# Patient Record
Sex: Female | Born: 1955
Health system: Southern US, Community
[De-identification: ages and names within clinical notes are randomized; demographics above are authoritative.]

## PROBLEM LIST (undated history)

## (undated) DIAGNOSIS — F419 Anxiety disorder, unspecified: Secondary | ICD-10-CM

## (undated) DIAGNOSIS — M79675 Pain in left toe(s): Secondary | ICD-10-CM

## (undated) DIAGNOSIS — Z0000 Encounter for general adult medical examination without abnormal findings: Secondary | ICD-10-CM

## (undated) DIAGNOSIS — E559 Vitamin D deficiency, unspecified: Secondary | ICD-10-CM

## (undated) DIAGNOSIS — L309 Dermatitis, unspecified: Secondary | ICD-10-CM

## (undated) DIAGNOSIS — M549 Dorsalgia, unspecified: Secondary | ICD-10-CM

## (undated) DIAGNOSIS — I10 Essential (primary) hypertension: Secondary | ICD-10-CM

## (undated) DIAGNOSIS — Z124 Encounter for screening for malignant neoplasm of cervix: Principal | ICD-10-CM

## (undated) DIAGNOSIS — E663 Overweight: Secondary | ICD-10-CM

## (undated) DIAGNOSIS — Z9109 Other allergy status, other than to drugs and biological substances: Secondary | ICD-10-CM

## (undated) DIAGNOSIS — E669 Obesity, unspecified: Secondary | ICD-10-CM

## (undated) DIAGNOSIS — C801 Malignant (primary) neoplasm, unspecified: Secondary | ICD-10-CM

## (undated) DIAGNOSIS — K760 Fatty (change of) liver, not elsewhere classified: Secondary | ICD-10-CM

## (undated) DIAGNOSIS — E785 Hyperlipidemia, unspecified: Secondary | ICD-10-CM

## (undated) DIAGNOSIS — Z91018 Allergy to other foods: Secondary | ICD-10-CM

## (undated) HISTORY — DX: Dermatitis, unspecified: L30.9

## (undated) HISTORY — DX: Overweight: E66.3

## (undated) HISTORY — DX: Pain in left toe(s): M79.675

## (undated) HISTORY — DX: Encounter for screening for malignant neoplasm of cervix: Z12.4

## (undated) HISTORY — PX: OTHER SURGICAL HISTORY: SHX169

## (undated) HISTORY — DX: Dorsalgia, unspecified: M54.9

## (undated) HISTORY — PX: BREAST BIOPSY: SHX20

## (undated) HISTORY — DX: Fatty (change of) liver, not elsewhere classified: K76.0

## (undated) HISTORY — DX: Anxiety disorder, unspecified: F41.9

## (undated) HISTORY — DX: Other allergy status, other than to drugs and biological substances: Z91.09

## (undated) HISTORY — DX: Allergy to other foods: Z91.018

## (undated) HISTORY — DX: Hyperlipidemia, unspecified: E78.5

## (undated) HISTORY — DX: Malignant (primary) neoplasm, unspecified: C80.1

## (undated) HISTORY — DX: Obesity, unspecified: E66.9

## (undated) HISTORY — DX: Vitamin D deficiency, unspecified: E55.9

## (undated) HISTORY — DX: Morbid (severe) obesity due to excess calories: E66.01

## (undated) HISTORY — DX: Encounter for general adult medical examination without abnormal findings: Z00.00

## (undated) HISTORY — DX: Essential (primary) hypertension: I10

---

## 2004-05-13 ENCOUNTER — Emergency Department (HOSPITAL_COMMUNITY): Admission: EM | Admit: 2004-05-13 | Discharge: 2004-05-13 | Payer: Self-pay | Admitting: Emergency Medicine

## 2004-08-26 ENCOUNTER — Encounter (INDEPENDENT_AMBULATORY_CARE_PROVIDER_SITE_OTHER): Payer: Self-pay | Admitting: *Deleted

## 2004-08-26 LAB — CONVERTED CEMR LAB

## 2004-09-06 ENCOUNTER — Ambulatory Visit: Payer: Self-pay | Admitting: Family Medicine

## 2004-12-11 ENCOUNTER — Ambulatory Visit: Payer: Self-pay | Admitting: Family Medicine

## 2004-12-22 ENCOUNTER — Ambulatory Visit: Payer: Self-pay | Admitting: Family Medicine

## 2006-04-01 ENCOUNTER — Ambulatory Visit: Payer: Self-pay | Admitting: Cardiology

## 2006-04-29 ENCOUNTER — Ambulatory Visit (HOSPITAL_COMMUNITY): Admission: RE | Admit: 2006-04-29 | Discharge: 2006-04-29 | Payer: Self-pay | Admitting: Physician Assistant

## 2006-04-29 ENCOUNTER — Encounter (INDEPENDENT_AMBULATORY_CARE_PROVIDER_SITE_OTHER): Payer: Self-pay | Admitting: Specialist

## 2006-07-25 DIAGNOSIS — I10 Essential (primary) hypertension: Secondary | ICD-10-CM

## 2006-07-25 DIAGNOSIS — F17200 Nicotine dependence, unspecified, uncomplicated: Secondary | ICD-10-CM | POA: Insufficient documentation

## 2006-07-26 ENCOUNTER — Encounter (INDEPENDENT_AMBULATORY_CARE_PROVIDER_SITE_OTHER): Payer: Self-pay | Admitting: *Deleted

## 2007-03-13 ENCOUNTER — Encounter: Payer: Self-pay | Admitting: Family Medicine

## 2007-03-14 ENCOUNTER — Encounter: Payer: Self-pay | Admitting: Family Medicine

## 2007-04-23 ENCOUNTER — Ambulatory Visit: Payer: Self-pay | Admitting: Family Medicine

## 2007-04-23 DIAGNOSIS — I10 Essential (primary) hypertension: Secondary | ICD-10-CM | POA: Insufficient documentation

## 2007-04-23 DIAGNOSIS — E782 Mixed hyperlipidemia: Secondary | ICD-10-CM

## 2007-10-01 ENCOUNTER — Encounter: Payer: Self-pay | Admitting: Family Medicine

## 2007-12-03 ENCOUNTER — Ambulatory Visit: Payer: Self-pay | Admitting: Family Medicine

## 2007-12-03 DIAGNOSIS — L309 Dermatitis, unspecified: Secondary | ICD-10-CM

## 2007-12-03 HISTORY — DX: Dermatitis, unspecified: L30.9

## 2007-12-09 ENCOUNTER — Ambulatory Visit: Payer: Self-pay | Admitting: Family Medicine

## 2007-12-20 LAB — CONVERTED CEMR LAB
ALT: 25 units/L (ref 0–35)
AST: 25 units/L (ref 0–37)
Albumin: 4.1 g/dL (ref 3.5–5.2)
Alkaline Phosphatase: 53 units/L (ref 39–117)
BUN: 13 mg/dL (ref 6–23)
Bilirubin, Direct: 0.1 mg/dL (ref 0.0–0.3)
CO2: 27 meq/L (ref 19–32)
Calcium: 9.3 mg/dL (ref 8.4–10.5)
Chloride: 102 meq/L (ref 96–112)
Cholesterol: 212 mg/dL (ref 0–200)
Creatinine, Ser: 0.9 mg/dL (ref 0.4–1.2)
Direct LDL: 128.4 mg/dL
GFR calc Af Amer: 85 mL/min
GFR calc non Af Amer: 70 mL/min
Glucose, Bld: 83 mg/dL (ref 70–99)
HDL: 59.4 mg/dL (ref >39.0)
Potassium: 3.9 meq/L (ref 3.5–5.1)
Sodium: 138 meq/L (ref 135–145)
Total Bilirubin: 0.7 mg/dL (ref 0.3–1.2)
Total CHOL/HDL Ratio: 3.6
Total Protein: 6.9 g/dL (ref 6.0–8.3)
Triglycerides: 112 mg/dL (ref 0–149)
VLDL: 22 mg/dL (ref 0–40)

## 2007-12-22 ENCOUNTER — Encounter (INDEPENDENT_AMBULATORY_CARE_PROVIDER_SITE_OTHER): Payer: Self-pay | Admitting: *Deleted

## 2008-03-05 ENCOUNTER — Telehealth (INDEPENDENT_AMBULATORY_CARE_PROVIDER_SITE_OTHER): Payer: Self-pay | Admitting: *Deleted

## 2008-03-05 ENCOUNTER — Ambulatory Visit: Payer: Self-pay | Admitting: Family Medicine

## 2008-03-05 DIAGNOSIS — R51 Headache: Secondary | ICD-10-CM | POA: Insufficient documentation

## 2008-03-05 DIAGNOSIS — R519 Headache, unspecified: Secondary | ICD-10-CM | POA: Insufficient documentation

## 2008-03-09 ENCOUNTER — Telehealth (INDEPENDENT_AMBULATORY_CARE_PROVIDER_SITE_OTHER): Payer: Self-pay | Admitting: *Deleted

## 2008-03-09 LAB — CONVERTED CEMR LAB: Sed Rate: 7 mm/hr (ref 0–22)

## 2008-03-12 ENCOUNTER — Encounter (INDEPENDENT_AMBULATORY_CARE_PROVIDER_SITE_OTHER): Payer: Self-pay | Admitting: *Deleted

## 2008-07-19 ENCOUNTER — Ambulatory Visit: Payer: Self-pay | Admitting: Family Medicine

## 2008-07-29 LAB — CONVERTED CEMR LAB
ALT: 47 units/L — ABNORMAL HIGH (ref 0–35)
AST: 29 units/L (ref 0–37)
Albumin: 4.3 g/dL (ref 3.5–5.2)
Alkaline Phosphatase: 57 units/L (ref 39–117)
BUN: 13 mg/dL (ref 6–23)
Bilirubin, Direct: 0.1 mg/dL (ref 0.0–0.3)
CO2: 30 meq/L (ref 19–32)
Calcium: 9.5 mg/dL (ref 8.4–10.5)
Chloride: 101 meq/L (ref 96–112)
Cholesterol: 231 mg/dL (ref 0–200)
Creatinine, Ser: 0.8 mg/dL (ref 0.4–1.2)
Direct LDL: 152 mg/dL
GFR calc Af Amer: 96 mL/min
GFR calc non Af Amer: 80 mL/min
Glucose, Bld: 85 mg/dL (ref 70–99)
HDL: 66.4 mg/dL (ref >39.0)
Potassium: 4.2 meq/L (ref 3.5–5.1)
Sodium: 139 meq/L (ref 135–145)
Total Bilirubin: 1 mg/dL (ref 0.3–1.2)
Total CHOL/HDL Ratio: 3.5
Total Protein: 7.5 g/dL (ref 6.0–8.3)
Triglycerides: 75 mg/dL (ref 0–149)
VLDL: 15 mg/dL (ref 0–40)

## 2008-07-30 ENCOUNTER — Encounter (INDEPENDENT_AMBULATORY_CARE_PROVIDER_SITE_OTHER): Payer: Self-pay | Admitting: *Deleted

## 2008-08-30 ENCOUNTER — Telehealth: Payer: Self-pay | Admitting: Family Medicine

## 2008-12-22 ENCOUNTER — Telehealth (INDEPENDENT_AMBULATORY_CARE_PROVIDER_SITE_OTHER): Payer: Self-pay | Admitting: *Deleted

## 2008-12-23 ENCOUNTER — Ambulatory Visit: Payer: Self-pay | Admitting: Family Medicine

## 2008-12-29 ENCOUNTER — Telehealth: Payer: Self-pay | Admitting: Family Medicine

## 2008-12-29 LAB — CONVERTED CEMR LAB
ALT: 61 units/L — ABNORMAL HIGH (ref 0–35)
AST: 40 units/L — ABNORMAL HIGH (ref 0–37)
Albumin: 4.3 g/dL (ref 3.5–5.2)
Alkaline Phosphatase: 59 units/L (ref 39–117)
Bilirubin, Direct: 0 mg/dL (ref 0.0–0.3)
Cholesterol: 220 mg/dL — ABNORMAL HIGH (ref 0–200)
Direct LDL: 144.9 mg/dL
HDL: 69.2 mg/dL (ref >39.00)
Total Bilirubin: 0.7 mg/dL (ref 0.3–1.2)
Total CHOL/HDL Ratio: 3
Total Protein: 7.2 g/dL (ref 6.0–8.3)
Triglycerides: 66 mg/dL (ref 0.0–149.0)
VLDL: 13.2 mg/dL (ref 0.0–40.0)

## 2009-01-14 ENCOUNTER — Ambulatory Visit: Payer: Self-pay | Admitting: Family Medicine

## 2009-01-14 DIAGNOSIS — R740 Nonspecific elevation of levels of transaminase and lactic acid dehydrogenase [LDH]: Secondary | ICD-10-CM

## 2009-01-21 ENCOUNTER — Telehealth (INDEPENDENT_AMBULATORY_CARE_PROVIDER_SITE_OTHER): Payer: Self-pay | Admitting: *Deleted

## 2009-01-21 LAB — CONVERTED CEMR LAB
ALT: 48 units/L — ABNORMAL HIGH (ref 0–35)
AST: 32 units/L (ref 0–37)
Albumin: 4.6 g/dL (ref 3.5–5.2)
Alkaline Phosphatase: 57 units/L (ref 39–117)
Bilirubin, Direct: 0 mg/dL (ref 0.0–0.3)
GGT: 43 units/L (ref 7–51)
HCV Ab: NEGATIVE
Hep A IgM: NEGATIVE
Hep B C IgM: NEGATIVE
Hepatitis B Surface Ag: NEGATIVE
Total Bilirubin: 0.9 mg/dL (ref 0.3–1.2)
Total Protein: 7.7 g/dL (ref 6.0–8.3)

## 2009-09-26 ENCOUNTER — Encounter (INDEPENDENT_AMBULATORY_CARE_PROVIDER_SITE_OTHER): Payer: Self-pay | Admitting: *Deleted

## 2009-09-26 ENCOUNTER — Ambulatory Visit: Payer: Self-pay | Admitting: Family Medicine

## 2009-09-26 DIAGNOSIS — Z85828 Personal history of other malignant neoplasm of skin: Secondary | ICD-10-CM | POA: Insufficient documentation

## 2009-09-27 ENCOUNTER — Encounter: Payer: Self-pay | Admitting: Family Medicine

## 2009-09-30 ENCOUNTER — Telehealth (INDEPENDENT_AMBULATORY_CARE_PROVIDER_SITE_OTHER): Payer: Self-pay | Admitting: *Deleted

## 2009-10-03 ENCOUNTER — Telehealth (INDEPENDENT_AMBULATORY_CARE_PROVIDER_SITE_OTHER): Payer: Self-pay | Admitting: *Deleted

## 2009-10-05 ENCOUNTER — Telehealth (INDEPENDENT_AMBULATORY_CARE_PROVIDER_SITE_OTHER): Payer: Self-pay | Admitting: *Deleted

## 2009-10-19 ENCOUNTER — Telehealth: Payer: Self-pay | Admitting: Family Medicine

## 2009-10-31 ENCOUNTER — Ambulatory Visit: Payer: Self-pay | Admitting: Family Medicine

## 2009-11-15 ENCOUNTER — Ambulatory Visit: Payer: Self-pay | Admitting: Family Medicine

## 2009-11-16 LAB — CONVERTED CEMR LAB
Albumin: 4.4 g/dL (ref 3.5–5.2)
Alkaline Phosphatase: 56 units/L (ref 39–117)
Total Protein: 7 g/dL (ref 6.0–8.3)

## 2009-11-21 ENCOUNTER — Telehealth (INDEPENDENT_AMBULATORY_CARE_PROVIDER_SITE_OTHER): Payer: Self-pay | Admitting: *Deleted

## 2009-12-26 DIAGNOSIS — C801 Malignant (primary) neoplasm, unspecified: Secondary | ICD-10-CM

## 2009-12-26 HISTORY — PX: MOHS SURGERY: SUR867

## 2009-12-26 HISTORY — DX: Malignant (primary) neoplasm, unspecified: C80.1

## 2010-01-04 ENCOUNTER — Telehealth (INDEPENDENT_AMBULATORY_CARE_PROVIDER_SITE_OTHER): Payer: Self-pay | Admitting: *Deleted

## 2010-01-24 ENCOUNTER — Ambulatory Visit: Payer: Self-pay | Admitting: Family Medicine

## 2010-01-25 ENCOUNTER — Telehealth (INDEPENDENT_AMBULATORY_CARE_PROVIDER_SITE_OTHER): Payer: Self-pay | Admitting: *Deleted

## 2010-02-06 ENCOUNTER — Telehealth: Payer: Self-pay | Admitting: Family Medicine

## 2010-05-17 ENCOUNTER — Encounter (INDEPENDENT_AMBULATORY_CARE_PROVIDER_SITE_OTHER): Payer: Self-pay | Admitting: *Deleted

## 2010-06-05 ENCOUNTER — Other Ambulatory Visit: Payer: Self-pay | Admitting: Family Medicine

## 2010-06-05 ENCOUNTER — Ambulatory Visit
Admission: RE | Admit: 2010-06-05 | Discharge: 2010-06-05 | Payer: Self-pay | Source: Home / Self Care | Attending: Family Medicine | Admitting: Family Medicine

## 2010-06-05 ENCOUNTER — Encounter: Payer: Self-pay | Admitting: Family Medicine

## 2010-06-05 LAB — HEPATIC FUNCTION PANEL
ALT: 61 U/L — ABNORMAL HIGH (ref 0–35)
AST: 45 U/L — ABNORMAL HIGH (ref 0–37)
Albumin: 4.5 g/dL (ref 3.5–5.2)
Alkaline Phosphatase: 59 U/L (ref 39–117)
Bilirubin, Direct: 0.2 mg/dL (ref 0.0–0.3)
Total Bilirubin: 0.7 mg/dL (ref 0.3–1.2)
Total Protein: 7.4 g/dL (ref 6.0–8.3)

## 2010-06-19 ENCOUNTER — Other Ambulatory Visit: Payer: Self-pay | Admitting: Family Medicine

## 2010-06-19 ENCOUNTER — Encounter: Payer: Self-pay | Admitting: Family Medicine

## 2010-06-19 ENCOUNTER — Ambulatory Visit
Admission: RE | Admit: 2010-06-19 | Discharge: 2010-06-19 | Payer: Self-pay | Source: Home / Self Care | Attending: Family Medicine | Admitting: Family Medicine

## 2010-06-19 LAB — HEPATIC FUNCTION PANEL
ALT: 40 U/L — ABNORMAL HIGH (ref 0–35)
AST: 29 U/L (ref 0–37)
Albumin: 4.5 g/dL (ref 3.5–5.2)
Alkaline Phosphatase: 57 U/L (ref 39–117)
Bilirubin, Direct: 0.1 mg/dL (ref 0.0–0.3)
Total Bilirubin: 0.7 mg/dL (ref 0.3–1.2)
Total Protein: 7.2 g/dL (ref 6.0–8.3)

## 2010-06-19 LAB — GAMMA GT: GGT: 27 U/L (ref 7–51)

## 2010-06-20 LAB — CONVERTED CEMR LAB
HCV Ab: NEGATIVE
Hep A IgM: NEGATIVE
Hepatitis B Surface Ag: NEGATIVE

## 2010-06-25 LAB — CONVERTED CEMR LAB
ALT: 27 units/L (ref 0–35)
AST: 20 units/L (ref 0–37)
AST: 22 units/L (ref 0–37)
Albumin: 4.3 g/dL (ref 3.5–5.2)
Albumin: 4.7 g/dL (ref 3.5–5.2)
Alkaline Phosphatase: 65 units/L (ref 39–117)
BUN: 15 mg/dL (ref 6–23)
Basophils Relative: 1.1 % (ref 0.0–3.0)
Blood in Urine, dipstick: NEGATIVE
Chloride: 100 meq/L (ref 96–112)
Eosinophils Relative: 12.6 % — ABNORMAL HIGH (ref 0.0–5.0)
GFR calc non Af Amer: 79.3 mL/min (ref >60)
Glucose, Urine, Semiquant: NEGATIVE
HCT: 44.3 % (ref 36.0–46.0)
Hemoglobin: 15.2 g/dL — ABNORMAL HIGH (ref 12.0–15.0)
Hgb A1c MFr Bld: 5.6 % (ref 4.6–6.5)
Ketones, urine, test strip: NEGATIVE
Lymphs Abs: 1.4 10*3/uL (ref 0.7–4.0)
MCV: 96.6 fL (ref 78.0–100.0)
Monocytes Absolute: 0.4 10*3/uL (ref 0.1–1.0)
Monocytes Relative: 8.3 % (ref 3.0–12.0)
Neutro Abs: 2.7 10*3/uL (ref 1.4–7.7)
Nitrite: NEGATIVE
Potassium: 3.9 meq/L (ref 3.5–5.1)
Protein, U semiquant: NEGATIVE
RBC: 4.59 M/uL (ref 3.87–5.11)
Sodium: 139 meq/L (ref 135–145)
Specific Gravity, Urine: <1.005
TSH: 0.81 microintl units/mL (ref 0.35–5.50)
Total Bilirubin: 0.6 mg/dL (ref 0.3–1.2)
Total Bilirubin: 0.7 mg/dL (ref 0.3–1.2)
Total Protein: 7.9 g/dL (ref 6.0–8.3)
WBC Urine, dipstick: NEGATIVE
WBC: 5.3 10*3/uL (ref 4.5–10.5)
pH: 7

## 2010-06-27 NOTE — Letter (Signed)
Summary: Primary Care Consult Scheduled Letter  [facility]  [address] [place], [place] [address]   Phone: [phone]  Fax: [phone]      09/26/2009 MRN: [id number]  Annessa WITT [address] [place], [place]  [address]    Dear Ms. Marlana Salvage,    We have scheduled an appointment for you.  At the recommendation of Dr. Loreen Freud, we have scheduled you a consult with Dr. Bufford Buttner of [facility] on 11-17-2009 arrive by 10:25am.  Their address is [address] [place], [place] [address]. The office phone number is [phone].  If this appointment day and time is not convenient for you, please feel free to call the office of the doctor you are being referred to at the number listed above and reschedule the appointment.    It is important for you to keep your scheduled appointments. We are here to make sure you are given good patient care.   Thank you,    Renee, Patient Care Coordinator [place] [facility]

## 2010-06-27 NOTE — Progress Notes (Signed)
Summary: RF CRESTOR 9/12,9/13  Phone Note Call from Patient   Caller: Patient Details for Reason: Request Crestor Summary of Call: Mssg from pt requesting Crestor, pt was given samples recently and no rx, was advised to f/u and have labs drawn but lipid profile not done.. Last OV 09/26/09 please advise Initial call taken by: Almeta Monas CMA Duncan Dull),  February 06, 2010 3:21 PM  Follow-up for Phone Call        refill crestor x 1 ---  needs labs done Follow-up by: Loreen Freud DO,  February 06, 2010 4:59 PM  Additional Follow-up for Phone Call Additional follow up Details #1::        Left message to call back  Almeta Monas CMA Duncan Dull)  February 07, 2010 8:18 AM  patient returned called patient had labs done 01/24/10 NMR hasn't been reviewed yet patient is aware she is out of medication so needs to know lab results...Marland KitchenMarland KitchenDoristine Devoid CMA  February 07, 2010 9:50 AM      Additional Follow-up for Phone Call Additional follow up Details #2::    IT WAS JUST SCANNED IN YESTERDAY SO i HAVE NOT REVIEWED IT YET---WE CAN LEAVE SOME SAMPLES FOR HER AND WE WILL CALL HER AFTER I REVIEW LABS Follow-up by: Loreen Freud DO,  February 07, 2010 9:52 AM  Additional Follow-up for Phone Call Additional follow up Details #3:: Details for Additional Follow-up Action Taken: Adv pt of lab results and medication change to 20mg  crestor, pt voiced understanding. [facility] ot [facility] per pt request.  Pt will schedule f/u labs in 3 mos.  Almeta Monas CMA Duncan Dull)  February 07, 2010 4:56 PM   New/Updated Medications: CRESTOR 20 MG TABS (ROSUVASTATIN CALCIUM) 1 by mouth at bedtime Prescriptions: CRESTOR 20 MG TABS (ROSUVASTATIN CALCIUM) 1 by mouth at bedtime  #30 x 2   Entered by:   Almeta Monas CMA (AAMA)   Authorized by:   Loreen Freud DO   Signed by:   Almeta Monas CMA (AAMA) on 02/07/2010   Method used:   Faxed to ...       [facility]       [address]       [place], [place]  [address]       Ph: [phone]       Fax: [phone]   RxID:   [id number]

## 2010-06-27 NOTE — Progress Notes (Signed)
Summary: med to expensive  Phone Note Call from Patient Call back at Work Phone [phone]   Caller: Patient Summary of Call: Pt left VM that welchol is $228 a month and she is unable to afford it. Pt would like another med that is less expensive for her cholesterol. Pt has tried crestor in the pass and was given a discount card that made it affordable if this is an option. Pls advise............Marland KitchenFelecia Deloach CMA  Oct 19, 2009 11:34 AM   Follow-up for Phone Call        I thought the crestor was stopped because of increased liver enyzmes---- try samples crestor 10 mg daily---check lfts in 2 weeks ----272.4  hep Follow-up by: Loreen Freud DO,  Oct 19, 2009 11:51 AM  Additional Follow-up for Phone Call Additional follow up Details #1::        pt aware samples placed up front will schedule appt when samples picked up...............Marland KitchenFelecia Deloach CMA  Oct 19, 2009 1:45 PM

## 2010-06-27 NOTE — Progress Notes (Signed)
Summary: Lab Results   Phone Note Outgoing Call   Call placed by: Army Fossa CMA,  Oct 05, 2009 1:04 PM Summary of Call: Lab Results, LMTCB:  try welchol 1 packet daily as directed----it is not a statin-----recheck labs 3 months nmr, hep 272.4 Signed by Loreen Freud DO on 10/04/2009 at 4:53 PM   Follow-up for Phone Call        Pt is aware. Army Fossa CMA  Oct 06, 2009 8:55 AM     New/Updated Medications: WELCHOL 3.75 GM PACK (COLESEVELAM HCL) Take 1 packet daily as directed. Prescriptions: WELCHOL 3.75 GM PACK (COLESEVELAM HCL) Take 1 packet daily as directed.  #30 x 2   Entered by:   Army Fossa CMA   Authorized by:   Loreen Freud DO   Signed by:   Army Fossa CMA on 10/06/2009   Method used:   Electronically to        [facility]       [address]       [place], [place]  [address]       Ph: [phone]       Fax: [phone]   RxID:   [id number]

## 2010-06-27 NOTE — Progress Notes (Signed)
Summary: Med Concerns: Crestor  Phone Note Call from Patient Call back at Work Phone [phone]   Caller: Patient Reason for Call: Talk to Nurse Summary of Call: Message left on triage VM: Patient would like a call to discuss Crestor Initial call taken by: Shonna Chock CMA,  January 04, 2010 1:31 PM  Follow-up for Phone Call        left message to call office...................Marland KitchenFelecia Deloach CMA  January 04, 2010 2:41 PM  pt states that  she thought when she pick up sample in june she was suppose to have a rx for crestor as well. Informed pt per phone note only sample and discount card was given and she was to f/u with dr Laury Axon for lipid management. Advise pt labs need to be done, pt currently out of med samples given to hold pt over to lab appt...Marland KitchenMarland KitchenFelecia Deloach CMA  January 05, 2010 10:32 AM

## 2010-06-27 NOTE — Assessment & Plan Note (Signed)
Summary: cpx//pt will be fasting//lch   Vital Signs:  Patient profile:   55 year old female Height:      67.50 inches Weight:      193 pounds BMI:     29.89 Pulse rate:   72 / minute Pulse rhythm:   regular BP sitting:   108 / 70  (left arm) Cuff size:   regular  Vitals Entered By: Army Fossa CMA (Sep 26, 2009 9:10 AM) CC: CPX, Labwork, no pap. Discuss BP meds- to expensive.    History of Present Illness: Pt here for cpe and labs.  No pap.  Pt also needs to change bp med.  Pt is not taking crestor because she was concerned about side effects/ liver function.   Pt using diet , exercise and other supplements to lower cholesterol.  It is on 17 day diet.    Preventive Screening-Counseling & Management  Alcohol-Tobacco     Alcohol drinks/day: <1     Alcohol type: wine     Smoking Status: quit     Year Quit: 09/2006     Pack years: 12.5     Passive Smoke Exposure: no  Caffeine-Diet-Exercise     Caffeine use/day: 2-3     Does Patient Exercise: yes     Type of exercise: walking     Exercise (avg: min/session): 30-60     Times/week: 5  Hep-HIV-STD-Contraception     HIV Risk: no     Dental Visit-last 6 months yes     Dental Care Counseling: not indicated; dental care within six months     SBE monthly: no     SBE Education/Counseling: to perform regular SBE  Safety-Violence-Falls     Seat Belt Use: 100      Drug Use:  no.    Current Medications (verified): 1)  Hyzaar 100-25 Mg Tabs (Losartan Potassium-Hctz) .[facility] Kitchen.. 1 By Mouth Once Daily  Allergies (verified): No Known Drug Allergies  Past History:  Past medical, surgical, family and social histories (including risk factors) reviewed for relevance to current acute and chronic problems.  Past Medical History: Reviewed history from 04/23/2007 and no changes required. Hypertension Hyperlipidemia  Past Surgical History: Reviewed history from 04/23/2007 and no changes required. D and C - 12/11/2004, IUD  - -  05/28/1996 SCC--chest 2007  Family History: Reviewed history from 07/25/2006 and no changes required. CAD - parents, DM - parents Family History of CAD Female 1st degree relative 56 yo Family History of CAD Female 1st degree relative  56yo Family History Diabetes 1st degree relative Family History of Melanoma  Social History: Reviewed history from 04/23/2007 and no changes required. Former Smoker Married--- Occupation:  [employer] Alcohol use-yes Drug use-no Regular exercise-yes Caffeine use/day:  2-3 Does Patient Exercise:  yes Dental Care w/in 6 mos.:  yes Occupation:  employed  Review of Systems      See HPI General:  Denies chills, fatigue, fever, loss of appetite, malaise, sleep disorder, sweats, weakness, and weight loss. Eyes:  Denies blurring, discharge, double vision, eye irritation, eye pain, halos, itching, light sensitivity, red eye, vision loss-1 eye, and vision loss-both eyes; optho-due. ENT:  Denies decreased hearing, difficulty swallowing, ear discharge, earache, hoarseness, nasal congestion, nosebleeds, postnasal drainage, ringing in ears, sinus pressure, and sore throat. CV:  Denies bluish discoloration of lips or nails, chest pain or discomfort, difficulty breathing at night, difficulty breathing while lying down, fainting, fatigue, leg cramps with exertion, lightheadness, near fainting, palpitations, shortness of breath  with exertion, swelling of feet, swelling of hands, and weight gain. Resp:  Denies chest discomfort, chest pain with inspiration, cough, coughing up blood, excessive snoring, hypersomnolence, morning headaches, pleuritic, shortness of breath, sputum productive, and wheezing. GI:  Denies abdominal pain, bloody stools, change in bowel habits, constipation, dark tarry stools, diarrhea, excessive appetite, gas, hemorrhoids, indigestion, loss of appetite, nausea, vomiting, vomiting blood, and yellowish skin color. GU:  Denies abnormal vaginal bleeding,  decreased libido, discharge, dysuria, genital sores, hematuria, incontinence, nocturia, urinary frequency, and urinary hesitancy. MS:  Denies joint pain, joint redness, joint swelling, loss of strength, low back pain, mid back pain, muscle aches, muscle , cramps, muscle weakness, stiffness, and thoracic pain. Derm:  Denies changes in color of skin, changes in nail beds, dryness, excessive perspiration, flushing, hair loss, insect bite(s), itching, lesion(s), poor wound healing, and rash. Neuro:  Denies brief paralysis, difficulty with concentration, disturbances in coordination, falling down, headaches, inability to speak, memory loss, numbness, poor balance, seizures, sensation of room spinning, tingling, tremors, visual disturbances, and weakness. Psych:  Denies alternate hallucination ( auditory/visual), anxiety, depression, easily angered, easily tearful, irritability, mental problems, panic attacks, sense of great danger, suicidal thoughts/plans, thoughts of violence, unusual visions or sounds, and thoughts /plans of harming others. Endo:  Denies cold intolerance, excessive hunger, excessive thirst, excessive urination, heat intolerance, polyuria, and weight change. Heme:  Denies abnormal bruising, bleeding, enlarge lymph nodes, fevers, pallor, and skin discoloration. Allergy:  Denies hives or rash, itching eyes, persistent infections, seasonal allergies, and sneezing.  Physical Exam  General:  Well-developed,well-nourished,in no acute distress; alert,appropriate and cooperative throughout examination Head:  Normocephalic and atraumatic without obvious abnormalities. No apparent alopecia or balding. Eyes:  pupils equal, pupils round, pupils reactive to light, and no injection.   Ears:  External ear exam shows no significant lesions or deformities.  Otoscopic examination reveals clear canals, tympanic membranes are intact bilaterally without bulging, retraction, inflammation or discharge. Hearing  is grossly normal bilaterally. Nose:  External nasal examination shows no deformity or inflammation. Nasal mucosa are pink and moist without lesions or exudates. Mouth:  Oral mucosa and oropharynx without lesions or exudates.  Teeth in good repair. Neck:  No deformities, masses, or tenderness noted. Chest Wall:  No deformities, masses, or tenderness noted. Breasts:  gyn Lungs:  Normal respiratory effort, chest expands symmetrically. Lungs are clear to auscultation, no crackles or wheezes. Heart:  normal rate and no murmur.   Abdomen:  Bowel sounds positive,abdomen soft and non-tender without masses, organomegaly or hernias noted. Rectal:  gyn Genitalia:  gyn Msk:  normal ROM, no joint tenderness, no joint swelling, no joint warmth, no redness over joints, no joint deformities, no joint instability, and no crepitation.   Pulses:  R posterior tibial normal, R dorsalis pedis normal, R carotid normal, L posterior tibial normal, L dorsalis pedis normal, and L carotid normal.   Extremities:  No clubbing, cyanosis, edema, or deformity noted with normal full range of motion of all joints.   Neurologic:  No cranial nerve deficits noted. Station and gait are normal. Plantar reflexes are down-going bilaterally. DTRs are symmetrical throughout. Sensory, motor and coordinative functions appear intact. Skin:  Intact without suspicious lesions or rashes Cervical Nodes:  No lymphadenopathy noted Axillary Nodes:  No palpable lymphadenopathy Psych:  Cognition and judgment appear intact. Alert and cooperative with normal attention span and concentration. No apparent delusions, illusions, hallucinations   Impression & Recommendations:  Problem # 1:  PREVENTIVE HEALTH CARE (ICD-V70.0)  Orders: Venipuncture (  [address]) TLB-BMP (Basic Metabolic Panel-BMET) (80048-METABOL) TLB-CBC Platelet - w/Differential (85025-CBCD) TLB-Hepatic/Liver Function Pnl (80076-HEPATIC) TLB-TSH (Thyroid Stimulating Hormone)  (84443-TSH) T-NMR, Lipoprofile ([id number]) EKG w/ Interpretation ([address]) UA Dipstick w/o Micro (manual) (13086)  Problem # 2:  HYPERLIPIDEMIA (ICD-272.4)  The following medications were removed from the medication list:    Crestor 20 Mg Tabs (Rosuvastatin calcium) .[facility]land Kitchen... Take 1 by mouth at bedtime  Orders: Venipuncture ([address]) TLB-BMP (Basic Metabolic Panel-BMET) (80048-METABOL) TLB-CBC Platelet - w/Differential (85025-CBCD) TLB-Hepatic/Liver Function Pnl (80076-HEPATIC) TLB-TSH (Thyroid Stimulating Hormone) (84443-TSH) T-NMR, Lipoprofile ([address]-28413) EKG w/ Interpretation ([address])  Labs Reviewed: SGOT: 32 (01/14/2009)   SGPT: 48 (01/14/2009)   HDL:69.20 (12/23/2008), 66.4 (07/19/2008)  LDL:DEL (07/19/2008), DEL (12/09/2007)  Chol:220 (12/23/2008), 231 (07/19/2008)  Trig:66.0 (12/23/2008), 75 (07/19/2008)  Problem # 3:  HYPERTENSION (ICD-401.9)  Her updated medication list for this problem includes:    Hyzaar 100-25 Mg Tabs (Losartan potassium-hctz) .Marland Kitchen... 1 by mouth once daily  Orders: Venipuncture ([address]) TLB-BMP (Basic Metabolic Panel-BMET) (80048-METABOL) TLB-CBC Platelet - w/Differential (85025-CBCD) TLB-Hepatic/Liver Function Pnl (80076-HEPATIC) TLB-TSH (Thyroid Stimulating Hormone) (84443-TSH) T-NMR, Lipoprofile ([address]25-36644) EKG w/ Interpretation (93000)  BP today: 108/70 Prior BP: 102/68 (07/19/2008)  Labs Reviewed: K+: 4.2 (07/19/2008) Creat: : 0.8 (07/19/2008)   Chol: 220 (12/23/2008)   HDL: 69.20 (12/23/2008)   LDL: DEL (07/19/2008)   TG: 66.0 (12/23/2008)  Problem # 4:  TOBACCO DEPENDENCE (ICD-305.1)  Encouraged smoking cessation and discussed different methods for smoking cessation.   Complete Medication List: 1)  Hyzaar 100-25 Mg Tabs (Losartan potassium-hctz) .Marland Kitchen.. 1 by mouth once daily  Other Orders: TLB-A1C / Hgb A1C (Glycohemoglobin) (83036-A1C) Dermatology Referral (Derma) Prescriptions: HYZAAR 100-25 MG TABS (LOSARTAN POTASSIUM-HCTZ) 1 by  mouth once daily  #30 x 2   Entered and Authorized by:   Loreen Freud DO   Signed by:   Loreen Freud DO on 09/26/2009   Method used:   Electronically to        [facility]       [address]       [place], [place]  [address]       Ph: [phone]       Fax: [phone]   RxID:   [id number]    EKG  Procedure date:  09/26/2009  Findings:      Normal sinus rhythm with rate of:  62   Flu Vaccine Next Due:  Not Indicated Last PAP:  Done. (08/26/2004 12:00:00 AM) PAP Result Date:  08/31/2009 PAP Result:  normal PAP Next Due:  1 yr      Laboratory Results   Urine Tests   Date/Time Reported: Sep 26, 2009 10:32 AM   Routine Urinalysis   Color: yellow Appearance: Clear Glucose: negative   (Normal Range: Negative) Bilirubin: negative   (Normal Range: Negative) Ketone: negative   (Normal Range: Negative) Spec. Gravity: <1.005   (Normal Range: 1.003-1.035) Blood: negative   (Normal Range: Negative) pH: 7.0   (Normal Range: 5.0-8.0) Protein: negative   (Normal Range: Negative) Urobilinogen: negative   (Normal Range: 0-1) Nitrite: negative   (Normal Range: Negative) Leukocyte Esterace: negative   (Normal Range: Negative)    Comments: Floydene Flock  Sep 26, 2009 10:32 AM

## 2010-06-27 NOTE — Progress Notes (Signed)
Summary: Med Change  Phone Note Call from Patient Call back at Home Phone [phone]   Caller: Patient Summary of Call: Pt states that she is taking Crestor and that Dr Laury Axon gave her samples of the Crestor. She states she is taking 10mg  of the Crestor. We have Welchol on her med list she said she never picked this up due to expenses. She would like to try Pravastatin because that is cheaper. Please advise. Army Fossa CMA  November 21, 2009 9:35 AM   Follow-up for Phone Call        Pravastatin is cheaper but also less effective.  if Dr Laury Axon wanted Crestor we can give her additional samples and a discount card which should bring the cost down.  At this time, give her enough samples to get her until she can discuss her lipid management w/ Dr Laury Axon. Follow-up by: Neena Rhymes MD,  November 21, 2009 9:54 AM  Additional Follow-up for Phone Call Additional follow up Details #1::        Left message for pt to call back. Army Fossa CMA  November 21, 2009 9:58 AM DISCUSS WITH PATIENT, samples placed up front...............Marland KitchenFelecia Deloach CMA  November 22, 2009 11:58 AM     New/Updated Medications: CRESTOR 10 MG TABS (ROSUVASTATIN CALCIUM) 1 by mouth daily.

## 2010-06-27 NOTE — Progress Notes (Signed)
Summary:  re-labs results  Phone Note Call from Patient Call back at Work Phone [phone]   Caller: Mom Summary of Call: PT left VM that she received call about lab results that indicated that she was having some allergy. Pt just remember that a couple of weeks ago she had a tick bite that is still red and irratiated. Pt would like to know if this has anything to do with . pls advise Initial call taken by: Jeremy Johann CMA,  Sep 30, 2009 10:29 AM  Follow-up for Phone Call        could be if still irritated we should see it and do more labs.   Follow-up by: Loreen Freud DO,  Sep 30, 2009 12:23 PM  Additional Follow-up for Phone Call Additional follow up Details #1::        left message to call office............Marland KitchenFelecia Deloach CMA  Sep 30, 2009 12:55 PM     Additional Follow-up for Phone Call Additional follow up Details #2::    Spoke with patient, patient said she is out of town. Patient will watch tick bite over the weekend and call on Monday morning if she feels the need to be seen Follow-up by: Shonna Chock,  Sep 30, 2009 1:53 PM

## 2010-06-27 NOTE — Progress Notes (Signed)
Summary: refill request  Phone Note Refill Request Call back at [phone] Message from:  Pharmacy on January 25, 2010 12:52 PM  Refills Requested: Medication #1:  HYZAAR 100-25 MG TABS 1 by mouth once daily   Dosage confirmed as above?Dosage Confirmed   Supply Requested: 1 month   Last Refilled: 12/24/2009 [facility]  Next Appointment Scheduled: none Initial call taken by: Lavell Islam,  January 25, 2010 12:52 PM    Prescriptions: HYZAAR 100-25 MG TABS (LOSARTAN POTASSIUM-HCTZ) 1 by mouth once daily  #30 x 8   Entered and Authorized by:   Almeta Monas CMA (AAMA)   Signed by:   Almeta Monas CMA (AAMA) on 01/25/2010   Method used:   Faxed to ...       [facility]       [address]       [place], [place]  [address]       Ph: [phone]       Fax: [phone]   RxID:   [id number]

## 2010-06-27 NOTE — Letter (Signed)
Summary: [facility]: Immunoassay Fecal Occult Blood (iFOB) Order Form  [facility] [place]  [address] [place], [place] [address]   Phone: [phone]  Fax: [phone]      [facility]: Immunoassay Fecal Occult Blood (iFOB) Order Form   Sep 26, 2009 MRN: [id number]   Elliett WITT Aug 31, 1955   Physicican Name:____yvonne Lowne,Do_____________________  Diagnosis Code:____v76.51______________________      Army Fossa CMA

## 2010-06-27 NOTE — Progress Notes (Signed)
Summary: tick bite  Phone Note Call from Patient Call back at [phone]   Caller: Spouse Summary of Call: pt husband left VM that wife has called several times requesting DOXYCYCLINE for tick bite that she has. Pt husband states he does not understand why med has not been rx. pt husband states that pt is out of town seeing after her mother who is on her death bed. Advise dr Laury Axon of VM per dr Laury Axon ok to rx DOXYCYCLINE 100mg  two times a day for 2 weeks and if symptoms do not subsided or worsen pt need to be seen immediately. pt will also need to f/u with dr Laury Axon once she returns. pt husband advise of the following and confirmed pharmacy............Marland KitchenFelecia Deloach CMA  Oct 03, 2009 2:28 PM     New/Updated Medications: DOXYCYCLINE HYCLATE 100 MG TABS (DOXYCYCLINE HYCLATE) take 1 tab two times a day for 14 days Prescriptions: DOXYCYCLINE HYCLATE 100 MG TABS (DOXYCYCLINE HYCLATE) take 1 tab two times a day for 14 days  #28 x 0   Entered by:   Jeremy Johann CMA   Authorized by:   Loreen Freud DO   Signed by:   Jeremy Johann CMA on 10/03/2009   Method used:   Faxed to ...       [facility]       [address]       [place], [place]  [address]       Ph: [phone]       Fax: [phone]   RxID:   [id number]

## 2010-06-29 NOTE — Letter (Signed)
Summary: Primary Care Appointment Letter  [facility] [place]  [address] [place], [place] [address]   Phone: [phone]  Fax: [phone]    05/17/2010 MRN: [id number]  Beatriz WITT [address] [place], [place]  [address]  Dear Ms. Marlana Salvage,   Your Primary Care Physician Loreen Freud DO has indicated that:    _______it is time to schedule an appointment.    _______you missed your appointment on______ and need to call and          reschedule.    __x_____you need to have lab work done.    _______you need to schedule an appointment discuss lab or test results.    _______you need to call to reschedule your appointment that is                       scheduled on _________.     Please call our office as soon as possible. Our phone number is [phone]. Please press option 1. Our office is open 8a-12noon and 1p-5p, Monday through Friday.     Thank you,    [facility] Primary Care Scheduler

## 2010-07-21 ENCOUNTER — Telehealth: Payer: Self-pay | Admitting: Family Medicine

## 2010-07-25 NOTE — Progress Notes (Signed)
Summary: refill  Phone Note Refill Request Message from:  Fax from Pharmacy on July 21, 2010 11:09 AM  Refills Requested: Medication #1:  CRESTOR 20 MG TABS 1 by mouth at bedtime*LABS DUE NOW**. [facility] - fax [phone]  Initial call taken by: Okey Regal [place],  July 21, 2010 11:10 AM  Follow-up for Phone Call        pt had labs done 06/05/10 [facility]--would you like for her to continue current dose Follow-up by: Almeta Monas CMA Duncan Dull),  July 21, 2010 12:05 PM  Additional Follow-up for Phone Call Additional follow up Details #1::        labs were great!  con't current dose.   Pt should have  cpe scheduled in May/June. Additional Follow-up by: Loreen Freud DO,  July 21, 2010 12:29 PM    Prescriptions: CRESTOR 20 MG TABS (ROSUVASTATIN CALCIUM) 1 by mouth at bedtime*LABS DUE NOW**  #30 x 3   Entered by:   Almeta Monas CMA (AAMA)   Authorized by:   Loreen Freud DO   Signed by:   Almeta Monas CMA (AAMA) on 07/21/2010   Method used:   Faxed to ...       [facility] (retail)       [address]       [place], [place]  [address]       Ph: [phone]       Fax: [phone]   RxID:   [id number]

## 2010-09-30 ENCOUNTER — Other Ambulatory Visit: Payer: Self-pay | Admitting: Family Medicine

## 2010-10-13 NOTE — Op Note (Signed)
NAMEMarlana Salvage [name]                    ACCOUNT NO.:  [id number]   MEDICAL RECORD NO.:  [id number]          PATIENT TYPE:  AMB   LOCATION:  ENDO                         FACILITY:  [facility]   PHYSICIAN:  Anselmo Rod, M.D.  DATE OF BIRTH:  1956/03/12   DATE OF PROCEDURE:  04/29/2006  DATE OF DISCHARGE:                               OPERATIVE REPORT   PROCEDURE PERFORMED:  Colonoscopy with snare polypectomy x1 and cold  biopsies x6.   ENDOSCOPIST:  Anselmo Rod, M.D.   INSTRUMENT USED:  Olympus video colonoscope.   INDICATIONS FOR PROCEDURE:  A 55 year old white female undergoing  screening colonoscopy to rule out colonic polyps, masses, etc.   PREPROCEDURE PREPARATION:  Informed consent was procured from the  patient.  The patient was fasted for four hours prior to the procedure  and prepped with 20 Osmoprep pills the night of and 12 Osmoprep pills  the morning of the procedure.  The risks and benefits of the procedure  including a 10% miss rate for cancer or polyps was discussed with the  patient as well.   PREPROCEDURE PHYSICAL:  The patient had stable vital signs.  Neck  supple.  Chest clear to auscultation.  S1 and S2 regular.  Abdomen soft  with normal bowel sounds.   DESCRIPTION OF PROCEDURE:  The patient was placed in left lateral  decubitus position and sedated with 100 mcg of fentanyl and 10 mg of  Versed given intravenously in slow incremental doses.  Once the patient  was adequately sedated and maintained on low flow oxygen and continuous  cardiac monitoring, the Olympus video colonoscope was advanced from the  rectum to cecum.  The appendicular orifice and ileocecal valve were  clearly visualized and photographed.  The terminal ileum appeared  healthy and without lesion.  A small sessile polyp was biopsied from 60  cm.  Multiple small sessile polyps were biopsied from the rectosigmoid  colon.  One small sessile polyp was removed by cold snare.  This polyp  was lost  in the process of suctioning it through the scope.  Retroflexion in the rectum revealed small internal hemorrhoids.  There  was no evidence of diverticulosis.  The patient tolerated the procedure  well without complication.   IMPRESSION:  1. Multiple small sessile polyps removed  from the rectosigmoid colon,      one from 60 cm.  2. No evidence of diverticulosis.  3. Normal terminal ileum.   RECOMMENDATIONS:  1. Await pathology results.  2. Avoid all nonsteroidals including aspirin for the next two weeks.  3. Repeat colonoscopy depending on pathology results.  4. Outpatient followup as need arises in the future.      Anselmo Rod, M.D.  Electronically Signed     JNM/MEDQ  D:  04/29/2006  T:  04/29/2006  Job:  [id number]   cc:   [name], MD

## 2010-10-13 NOTE — Assessment & Plan Note (Signed)
[facility]                              CARDIOLOGY OFFICE NOTE   NAME:[name], Latoya W                           MRN:          [id number]  DATE:04/01/2006                            DOB:          05-28-1956    I was asked by Dr. [name] to evaluate Latoya [name] for  hyperlipidemia and cardiovascular risk factors.   She is 55 years of age, married, has two children, one at [facility] and  one at [place] [place].  She works as an Engineer, production for  [employer].   She has enjoyed good health.  She was diagnosed with hypertension in  December 2005.  She has been on Micardis HCT, she says with good control.   She has no symptoms of ischemic heart disease.  She has had no previous  cardiac or cerebrovascular problems.   PAST MEDICAL HISTORY:  She is not allergic to any medications.  She is  currently on Micardis HCT 80/25 daily and Errin 0.35 mg p.o. daily.   She smokes a pack of cigarettes per day and has for 25 years.  She does not  use any recreational drugs.  She drinks about three to four glasses of wine  a day.  She walks about 2 miles on occasion.  Her husband just quit smoking  and she is going to try to quit.  He also is increasing his activity and she  is following suit.   There is no premature history of coronary artery disease.  She has no  history of diabetes.   SURGICAL HISTORY:  She had a basal cell carcinoma removed off her chest in  2007.   FAMILY HISTORY:  As above.   SOCIAL HISTORY:  As above.   REVIEW OF SYSTEMS:  Twelve systems reviewed and are negative other than HPI.   EXAMINATION:  VITAL SIGNS:  Her blood pressure is 140/95, heart pulse is 82  and regular, she is in sinus rhythm.  Her height is 5 feet 8 inches, she  weighs 180 pounds.  HEENT:  Normocephalic, atraumatic.  PERRLA.  Extraocular movements intact,  sclerae clear.  Facial symmetry is normal.  Dentition satisfactory.  NECK:  Carotids are full.  There are no br.  Thyroid is not enlarged,  trachea is midline.  LUNGS:  Clear except for some mild expiratory rhonchi.  HEART:  Reveals a nondisplaced PMI.  She has normal S1, S2.  No murmur, rub  or gallop.  ABDOMEN:  Soft with good bowel sounds.  There is no hepatosplenomegaly.  There is no tenderness.  EXTREMITIES:  Reveal no edema.  Pulses were 2+/4+.  NEUROLOGIC:  Intact.   Electrocardiogram shows sinus rhythm, otherwise normal.   Recent lipid panel showed a total cholesterol of 233, HDL 73, LDL of 144,  triglycerides of 78.  Thyroid function was normal.  Fasting blood sugar was  normal.   ASSESSMENT AND PLAN:  Latoya [name] has several cardiac risk factors for  premature coronary artery disease or cerebrovascular disease including  tobacco use, hypertension,  and sedentary lifestyle.  Her total cholesterol  is suboptimal, though her total cholesterol to HDL ratio is quite good.  Her  LDL is also mildly elevated at 144.  Her triglycerides are normal.  She is  not diabetic.   I spent about 20 minutes talking to her about risk factor modification.  She  seems to be motivated and at this time would prefer not to be on a  medication for her cholesterol.  I think that is probably reasonable.   RECOMMENDATION:  1. Discontinue tobacco use.  She has been given a prescription of Chantix      by Dr. Renaldo Fiddler.  2. Increase brisk walking to 3 hours per week.  3. Keep wine consumption less than 8 ounces a day.  4. Check blood pressure on a regular basis.  Goal is 130/80.  If this is      not the case, I have asked her to call me and we will bump her Micardis      HCT up.   I have made it very clear to her that if she continues to smoke, her blood  pressure is not under optimal control and she is not exercising on a regular  basis, to please come see me again in a year.  We may very well at that time  make additional recommendations including adding  statins.     Thomas C. Daleen Squibb, MD, [facility]  Electronically Signed    TCW/MedQ  DD: 04/01/2006  DT: 04/01/2006  Job #: [id number]   cc:   [name], MD

## 2010-10-30 ENCOUNTER — Other Ambulatory Visit: Payer: Self-pay | Admitting: Family Medicine

## 2010-10-30 NOTE — Telephone Encounter (Signed)
According to information in computer pt isn't a pt here.

## 2010-10-31 ENCOUNTER — Other Ambulatory Visit: Payer: Self-pay | Admitting: Family Medicine

## 2010-11-02 ENCOUNTER — Encounter: Payer: Self-pay | Admitting: Family Medicine

## 2010-11-02 ENCOUNTER — Other Ambulatory Visit (HOSPITAL_COMMUNITY)
Admission: RE | Admit: 2010-11-02 | Discharge: 2010-11-02 | Disposition: A | Discharge status: Home / Self Care | Payer: BC Managed Care – PPO | Source: Ambulatory Visit | Attending: Family Medicine | Admitting: Family Medicine

## 2010-11-02 ENCOUNTER — Ambulatory Visit (INDEPENDENT_AMBULATORY_CARE_PROVIDER_SITE_OTHER): Payer: BC Managed Care – PPO | Admitting: Family Medicine

## 2010-11-02 VITALS — BP 114/72 | HR 71 | Temp 98.6°F | Ht 67.5 in | Wt 184.4 lb

## 2010-11-02 DIAGNOSIS — I10 Essential (primary) hypertension: Secondary | ICD-10-CM

## 2010-11-02 DIAGNOSIS — Z01419 Encounter for gynecological examination (general) (routine) without abnormal findings: Secondary | ICD-10-CM | POA: Insufficient documentation

## 2010-11-02 DIAGNOSIS — Z0000 Encounter for general adult medical examination without abnormal findings: Secondary | ICD-10-CM

## 2010-11-02 DIAGNOSIS — E785 Hyperlipidemia, unspecified: Secondary | ICD-10-CM

## 2010-11-02 NOTE — Progress Notes (Signed)
  Subjective:     Latoya [name] is a 55 y.o. female and is here for a comprehensive physical exam. The patient reports no problems.  History   Social History  . Marital Status: Married    Spouse Name: N/A    Number of Children: N/A  . Years of Education: N/A   Occupational History  . 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 Smoking status: Former Games developer  . Smokeless tobacco: Not on file  . Alcohol Use: Yes  . Drug Use: No  . Sexually Active:    Other Topics Concern  . Not on file   Social History Narrative  . No narrative on file   Health Maintenance  Topic Date Due  . Pap Smear  04/01/1973  . Mammogram  04/01/2005  . Colonoscopy  04/01/2005  . Tetanus/tdap  05/28/2005  . Influenza Vaccine  02/26/2011    The following portions of the patient's history were reviewed and updated as appropriate: allergies, current medications, past family history, past medical history, past social history, past surgical history and problem list.  Review of Systems Review of Systems  Constitutional: Negative for activity change, appetite change and fatigue.  HENT: Negative for hearing loss, congestion, tinnitus and ear discharge.  dentist q58m Eyes: Negative for visual disturbance (optho--due) Respiratory: Negative for cough, chest tightness and shortness of breath.   Cardiovascular: Negative for chest pain, palpitations and leg swelling.  Gastrointestinal: Negative for abdominal pain, diarrhea, constipation and abdominal distention.  Genitourinary: Negative for urgency, frequency, decreased urine volume and difficulty urinating.  Musculoskeletal: Negative for back pain, arthralgias and gait problem.  Skin: Negative for color change, pallor and rash.  Neurological: Negative for dizziness, light-headedness, numbness and headaches.  Hematological: Negative for adenopathy. Does not bruise/bleed easily.  Psychiatric/Behavioral: Negative for suicidal ideas, confusion, sleep  disturbance, self-injury, dysphoric mood, decreased concentration and agitation.      Objective:    BP 114/72  Pulse 71  Temp(Src) 98.6 F (37 C) (Oral)  Ht 5' 7.5" (1.715 m)  Wt 184 lb 6.4 oz (83.643 kg)  BMI 28.45 kg/m2  SpO2 97% General appearance: alert, cooperative, appears stated age and no distress Head: Normocephalic, without obvious abnormality, atraumatic Eyes: conjunctivae/corneas clear. PERRL, EOM's intact. Fundi benign. Ears: normal TM's and external ear canals both ears Nose: Nares normal. Septum midline. Mucosa normal. No drainage or sinus tenderness. Throat: lips, mucosa, and tongue normal; teeth and gums normal Neck: no adenopathy, no carotid bruit, no JVD, supple, symmetrical, trachea midline and thyroid not enlarged, symmetric, no tenderness/mass/nodules Lungs: clear to auscultation bilaterally Breasts: normal appearance, no masses or tenderness Heart: regular rate and rhythm, S1, S2 normal, no murmur, click, rub or gallop Abdomen: soft, non-tender; bowel sounds normal; no masses,  no organomegaly Pelvic: cervix normal in appearance, external genitalia normal, no adnexal masses or tenderness, no cervical motion tenderness, rectovaginal septum normal, uterus normal size, shape, and consistency and vagina normal without discharge Extremities: extremities normal, atraumatic, no cyanosis or edema Pulses: 2+ and symmetric Skin: Skin color, texture, turgor normal. No rashes or lesions Lymph nodes: Cervical, supraclavicular, and axillary nodes normal. Neurologic: Grossly normal psych--AAOX3  not suicidal, no depression or anxiety    Assessment:    Healthy female exam.  HTN Hyperlipidemia postmenopausal   Plan:    check fasting labs ghm utd---tetanus due--pt refusing at this time mammo and bmd utd See After Visit Summary for Counseling Recommendations

## 2010-11-02 NOTE — Patient Instructions (Signed)

## 2010-11-03 ENCOUNTER — Other Ambulatory Visit (INDEPENDENT_AMBULATORY_CARE_PROVIDER_SITE_OTHER): Payer: BC Managed Care – PPO

## 2010-11-03 DIAGNOSIS — I10 Essential (primary) hypertension: Secondary | ICD-10-CM

## 2010-11-03 DIAGNOSIS — Z0000 Encounter for general adult medical examination without abnormal findings: Secondary | ICD-10-CM

## 2010-11-03 DIAGNOSIS — E785 Hyperlipidemia, unspecified: Secondary | ICD-10-CM

## 2010-11-03 DIAGNOSIS — N390 Urinary tract infection, site not specified: Secondary | ICD-10-CM

## 2010-11-03 LAB — CBC WITH DIFFERENTIAL/PLATELET
Eosinophils Relative: 10.1 % — ABNORMAL HIGH (ref 0.0–5.0)
MCV: 93.9 fl (ref 78.0–100.0)
Monocytes Absolute: 0.5 10*3/uL (ref 0.1–1.0)
Monocytes Relative: 8.4 % (ref 3.0–12.0)
Neutrophils Relative %: 47.7 % (ref 43.0–77.0)
Platelets: 166 10*3/uL (ref 150.0–400.0)
WBC: 5.5 10*3/uL (ref 4.5–10.5)

## 2010-11-03 LAB — LIPID PANEL
HDL: 41.6 mg/dL (ref >39.00)
LDL Cholesterol: 57 mg/dL (ref 0–99)
Total CHOL/HDL Ratio: 3
Triglycerides: 56 mg/dL (ref 0.0–149.0)
VLDL: 11.2 mg/dL (ref 0.0–40.0)

## 2010-11-03 LAB — BASIC METABOLIC PANEL
BUN: 12 mg/dL (ref 6–23)
Creatinine, Ser: 0.9 mg/dL (ref 0.4–1.2)
GFR: 73.64 mL/min (ref >60.00)

## 2010-11-03 LAB — POCT URINALYSIS DIPSTICK
Nitrite, UA: NEGATIVE
Protein, UA: NEGATIVE

## 2010-11-03 LAB — HEPATIC FUNCTION PANEL
Albumin: 4.6 g/dL (ref 3.5–5.2)
Total Protein: 7.2 g/dL (ref 6.0–8.3)

## 2010-11-03 NOTE — Progress Notes (Signed)
Addended by: Legrand Como on: 11/03/2010 01:57 PM   Modules accepted: Orders

## 2010-11-03 NOTE — Progress Notes (Signed)
Labs only

## 2010-11-05 LAB — URINE CULTURE: Colony Count: 60000

## 2010-11-08 ENCOUNTER — Telehealth: Payer: Self-pay

## 2010-11-08 NOTE — Telephone Encounter (Addendum)
Message copied by Arnette Norris on Wed Nov 08, 2010  3:51 PM ------      Message from: Lelon Perla      Created: Sun Nov 05, 2010  8:08 PM       Contaminated--recheck if symptomatic Overall great! con't meds-----eosinophils high----any allergy symptoms-------recheck cbcd in 3 months 288.8 -----all other labs --6 months 272.4 Lipid, hep, bmp

## 2010-11-08 NOTE — Telephone Encounter (Signed)
Message left to call the office.    KP 

## 2010-11-09 NOTE — Telephone Encounter (Signed)
Spoke with patient and she is aware if labs, she stated she will not come back for a repeat CBC-D since she is having allergy symptoms. She will repeat all labs in 6 mos, no urinary symptoms       KP

## 2010-11-23 ENCOUNTER — Other Ambulatory Visit: Payer: Self-pay | Admitting: Family Medicine

## 2010-11-23 MED ORDER — ROSUVASTATIN CALCIUM 20 MG PO TABS: 20.0000 mg | ORAL_TABLET | Freq: Every day | ORAL | Status: DC

## 2010-11-23 NOTE — Telephone Encounter (Signed)
Labs UTD. Rx faxed     KP 

## 2011-06-01 ENCOUNTER — Other Ambulatory Visit: Payer: Self-pay | Admitting: Family Medicine

## 2011-07-09 ENCOUNTER — Telehealth: Payer: Self-pay | Admitting: Family Medicine

## 2011-07-09 MED ORDER — ROSUVASTATIN CALCIUM 20 MG PO TABS: 20.0000 mg | ORAL_TABLET | Freq: Every day | ORAL | Status: DC

## 2011-07-09 NOTE — Telephone Encounter (Signed)
Refill-crestor 20mg  tab. Take one tablet (20mg  total) by mouth daily. Qty 30 last fill 1.4.13  Pharmacy comments: cycle fill medication. Authorization is required for next refill. No refills available.

## 2011-07-09 NOTE — Telephone Encounter (Signed)
Losartan/hctz 100-25mg  tab. 1 by mouth once daily. Qty 30 last fill 1.4.13  Pharmacy comments: cycle fill medication. Authorization is required for next refill. No refills available.

## 2011-07-11 ENCOUNTER — Other Ambulatory Visit: Payer: Self-pay | Admitting: Family Medicine

## 2011-07-11 MED ORDER — LOSARTAN POTASSIUM-HCTZ 100-25 MG PO TABS: 1.0000 | ORAL_TABLET | Freq: Every day | ORAL | Status: DC

## 2011-07-11 NOTE — Telephone Encounter (Signed)
The pt is requesting a refill of Losartan/HCTZ 100-25mg  tabs (qty 30) Last filled on 06/01/11  Thanks!

## 2011-07-20 ENCOUNTER — Ambulatory Visit (INDEPENDENT_AMBULATORY_CARE_PROVIDER_SITE_OTHER): Payer: BC Managed Care – PPO | Admitting: Internal Medicine

## 2011-07-20 ENCOUNTER — Telehealth: Payer: Self-pay | Admitting: Internal Medicine

## 2011-07-20 ENCOUNTER — Encounter: Payer: Self-pay | Admitting: Internal Medicine

## 2011-07-20 DIAGNOSIS — E785 Hyperlipidemia, unspecified: Secondary | ICD-10-CM

## 2011-07-20 DIAGNOSIS — I10 Essential (primary) hypertension: Secondary | ICD-10-CM

## 2011-07-20 DIAGNOSIS — Z79899 Other long term (current) drug therapy: Secondary | ICD-10-CM

## 2011-07-20 MED ORDER — ATORVASTATIN CALCIUM 40 MG PO TABS: 40.0000 mg | ORAL_TABLET | Freq: Every day | ORAL | Status: DC

## 2011-07-20 MED ORDER — LOSARTAN POTASSIUM-HCTZ 100-25 MG PO TABS: 1.0000 | ORAL_TABLET | Freq: Every day | ORAL | Status: DC

## 2011-07-20 NOTE — Telephone Encounter (Signed)
Lab orders entered for March 2013 and August 2013.

## 2011-07-20 NOTE — Patient Instructions (Signed)
Please return to lab fasting in ~ one month for cbc (eosinophilia), chem7-v58.69 and lipid/lft-272.4 Also please schedule same labs prior to next visit

## 2011-07-20 NOTE — Assessment & Plan Note (Signed)
Change crestor to lipitor for cost consideration. Obtain lipid/lft in one month after change. Ultimately if medication is continued long term then consider future nmr particle analysis when on stable dose.

## 2011-07-20 NOTE — Assessment & Plan Note (Signed)
Normotensive and stable. Continue current regimen. Monitor bp as outpt and followup in clinic as scheduled. rf hyzaar. Obtain chem7.

## 2011-07-20 NOTE — Progress Notes (Signed)
  Subjective:    Patient ID: Latoya [name], female    DOB: Dec 26, 1955, 56 y.o.   MRN: [id number]  HPI Pt presents to clinic for followup of multiple medical problems. Tolerates statin therapy without myalgias or abn lft however notes crestor to be expensive. No previous statin intolerances. Apparently had nmr particle analysis felt to be high. BP reviewed as normotensive. Hasn't had mammogram this year but plans to schedule. Continues to follow yearly with dermatology for h/o skin cancer. Past cbc demonstrated eosinophilia but has no apparent known allergic etiology. No other complaints.  Past Medical History  Diagnosis Date  . Hyperlipidemia   . Hypertension   . Cancer 12/2009    [facility]   Past Surgical History  Procedure Date  . Dilation and currettage   . Mohs surgery 12/2009    basal cell     reports that she has quit smoking. She has never used smokeless tobacco. She reports that she drinks alcohol. She reports that she does not use illicit drugs. family history includes Diabetes in her father and mother; Heart disease in her father and mother; and Melanoma in an unspecified family member. No Known Allergies    Review of Systems see hpi     Objective:   Physical Exam  Physical Exam  Nursing note and vitals reviewed. Constitutional: Appears well-developed and well-nourished. No distress.  HENT:  Head: Normocephalic and atraumatic.  Right Ear: External ear normal.  Left Ear: External ear normal.  Eyes: Conjunctivae are normal. No scleral icterus.  Neck: Neck supple. Carotid bruit is not present.  Cardiovascular: Normal rate, regular rhythm and normal heart sounds.  Exam reveals no gallop and no friction rub.   No murmur heard. Pulmonary/Chest: Effort normal and breath sounds normal. No respiratory distress. He has no wheezes. no rales.  Lymphadenopathy:    He has no cervical adenopathy.  Neurological:Alert.  Skin: Skin is warm and dry. Not diaphoretic.  Psychiatric: Has a normal  mood and affect.        Assessment & Plan:

## 2011-09-27 ENCOUNTER — Other Ambulatory Visit: Payer: Self-pay | Admitting: *Deleted

## 2011-09-27 DIAGNOSIS — Z79899 Other long term (current) drug therapy: Secondary | ICD-10-CM

## 2011-09-27 DIAGNOSIS — E785 Hyperlipidemia, unspecified: Secondary | ICD-10-CM

## 2011-09-27 LAB — CBC
Hemoglobin: 15.7 g/dL — ABNORMAL HIGH (ref 12.0–15.0)
MCH: 32.3 pg (ref 26.0–34.0)
Platelets: 251 10*3/uL (ref 150–400)
RBC: 4.86 MIL/uL (ref 3.87–5.11)
WBC: 7.3 10*3/uL (ref 4.0–10.5)

## 2011-09-27 LAB — BASIC METABOLIC PANEL
CO2: 25 mEq/L (ref 19–32)
Glucose, Bld: 94 mg/dL (ref 70–99)
Potassium: 3.9 mEq/L (ref 3.5–5.3)
Sodium: 133 mEq/L — ABNORMAL LOW (ref 135–145)

## 2011-09-27 LAB — HEPATIC FUNCTION PANEL
ALT: 26 U/L (ref 0–35)
Bilirubin, Direct: 0.2 mg/dL (ref 0.0–0.3)
Total Bilirubin: 0.8 mg/dL (ref 0.3–1.2)

## 2011-09-27 LAB — LIPID PANEL
Cholesterol: 154 mg/dL (ref 0–200)
Total CHOL/HDL Ratio: 2.3 Ratio
VLDL: 13 mg/dL (ref 0–40)

## 2011-10-09 ENCOUNTER — Other Ambulatory Visit: Payer: Self-pay | Admitting: Internal Medicine

## 2011-10-09 DIAGNOSIS — Z79899 Other long term (current) drug therapy: Secondary | ICD-10-CM

## 2011-10-09 DIAGNOSIS — D751 Secondary polycythemia: Secondary | ICD-10-CM

## 2011-10-09 DIAGNOSIS — E785 Hyperlipidemia, unspecified: Secondary | ICD-10-CM

## 2011-11-27 ENCOUNTER — Other Ambulatory Visit: Payer: Self-pay | Admitting: Obstetrics and Gynecology

## 2011-11-27 DIAGNOSIS — R928 Other abnormal and inconclusive findings on diagnostic imaging of breast: Secondary | ICD-10-CM

## 2011-12-04 ENCOUNTER — Ambulatory Visit
Admission: RE | Admit: 2011-12-04 | Discharge: 2011-12-04 | Disposition: A | Discharge status: Home / Self Care | Payer: BC Managed Care – PPO | Source: Ambulatory Visit | Attending: Obstetrics and Gynecology | Admitting: Obstetrics and Gynecology

## 2011-12-04 DIAGNOSIS — R928 Other abnormal and inconclusive findings on diagnostic imaging of breast: Secondary | ICD-10-CM

## 2011-12-10 ENCOUNTER — Encounter: Payer: Self-pay | Admitting: Internal Medicine

## 2012-01-02 LAB — CBC WITH DIFFERENTIAL/PLATELET
Eosinophils Absolute: 0.9 10*3/uL — ABNORMAL HIGH (ref 0.0–0.7)
Hemoglobin: 15.3 g/dL — ABNORMAL HIGH (ref 12.0–15.0)
Lymphocytes Relative: 28 % (ref 12–46)
Lymphs Abs: 1.6 10*3/uL (ref 0.7–4.0)
MCH: 33.8 pg (ref 26.0–34.0)
MCV: 94.5 fL (ref 78.0–100.0)
Monocytes Relative: 7 % (ref 3–12)
Neutrophils Relative %: 48 % (ref 43–77)
RBC: 4.53 MIL/uL (ref 3.87–5.11)

## 2012-01-02 LAB — BASIC METABOLIC PANEL
BUN: 18 mg/dL (ref 6–23)
CO2: 27 mEq/L (ref 19–32)
Chloride: 101 mEq/L (ref 96–112)
Creat: 0.88 mg/dL (ref 0.50–1.10)
Glucose, Bld: 83 mg/dL (ref 70–99)

## 2012-01-02 NOTE — Addendum Note (Signed)
Addended by: Mervin Kung A on: 01/02/2012 12:14 PM   Modules accepted: Orders

## 2012-01-02 NOTE — Progress Notes (Signed)
Pt presented to the lab, orders released. 

## 2012-01-03 LAB — LIPID PANEL: LDL Cholesterol: 85 mg/dL (ref 0–99)

## 2012-01-03 LAB — HEPATIC FUNCTION PANEL
Alkaline Phosphatase: 51 U/L (ref 39–117)
Indirect Bilirubin: 0.5 mg/dL (ref 0.0–0.9)
Total Bilirubin: 0.6 mg/dL (ref 0.3–1.2)

## 2012-01-11 ENCOUNTER — Ambulatory Visit (INDEPENDENT_AMBULATORY_CARE_PROVIDER_SITE_OTHER): Payer: BC Managed Care – PPO | Admitting: Internal Medicine

## 2012-01-11 ENCOUNTER — Encounter: Payer: Self-pay | Admitting: Internal Medicine

## 2012-01-11 ENCOUNTER — Telehealth: Payer: Self-pay | Admitting: Internal Medicine

## 2012-01-11 VITALS — BP 108/70 | HR 83 | Temp 98.2°F | Resp 14 | Ht 67.25 in | Wt 200.0 lb

## 2012-01-11 DIAGNOSIS — E669 Obesity, unspecified: Secondary | ICD-10-CM

## 2012-01-11 DIAGNOSIS — Z79899 Other long term (current) drug therapy: Secondary | ICD-10-CM

## 2012-01-11 DIAGNOSIS — I10 Essential (primary) hypertension: Secondary | ICD-10-CM

## 2012-01-11 DIAGNOSIS — G4700 Insomnia, unspecified: Secondary | ICD-10-CM

## 2012-01-11 DIAGNOSIS — E785 Hyperlipidemia, unspecified: Secondary | ICD-10-CM

## 2012-01-11 MED ORDER — PHENTERMINE HCL 37.5 MG PO CAPS: 37.5000 mg | ORAL_CAPSULE | ORAL | Status: DC

## 2012-01-11 NOTE — Patient Instructions (Signed)
Please schedule fasting labs prior to next visit Cbc-401.9, chem7-v58.69 and lipid/lft-272.4 Also please consider trying over the counter melatonin 1-3mg  at bedtime or valerian

## 2012-01-11 NOTE — Telephone Encounter (Signed)
Please schedule fasting labs prior to next visit  Cbc-401.9, chem7-v58.69 and lipid/lft-272.4  Patient has upcoming appointment in February/2014. She will be going to [facility] lab.

## 2012-01-11 NOTE — Progress Notes (Signed)
  Subjective:    Patient ID: Latoya [name], female    DOB: [birth year], 56 y.o.   MRN: [id number]  HPI Pt presents to clinic for followup of multiple medical problems. Wt down 9lbs. BP normotensive. Notes increased stress with current marriage seperation. Suffering from insomnia failing otc sleep aid. 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 Requests short term phentermine to assist in weight loss.  Past Medical History  Diagnosis Date  . Hyperlipidemia   . Hypertension   . Cancer 12/2009    [facility]   Past Surgical History  Procedure Date  . Dilation and currettage   . Mohs surgery 12/2009    basal cell     reports that she has quit smoking. She has never used smokeless tobacco. She reports that she drinks alcohol. She reports that she does not use illicit drugs. family history includes Diabetes in her father and mother; Heart disease in her father and mother; and Melanoma in an unspecified family member. No Known Allergies    Review of Systems see hpi     Objective:   Physical Exam  Physical Exam  Nursing note and vitals reviewed. Constitutional: Appears well-developed and well-nourished. No distress.  HENT:  Head: Normocephalic and atraumatic.  Right Ear: External ear normal.  Left Ear: External ear normal.  Eyes: Conjunctivae are normal. No scleral icterus.  Neck: Neck supple. Carotid bruit is not present.  Cardiovascular: Normal rate, regular rhythm and normal heart sounds.  Exam reveals no gallop and no friction rub.   No murmur heard. Pulmonary/Chest: Effort normal and breath sounds normal. No respiratory distress. He has no wheezes. no rales.  Lymphadenopathy:    He has no cervical adenopathy.  Neurological:Alert.  Skin: Skin is warm and dry. Not diaphoretic.  Psychiatric: Has a normal mood and affect.        Assessment & Plan:

## 2012-01-13 DIAGNOSIS — G4700 Insomnia, unspecified: Secondary | ICD-10-CM | POA: Insufficient documentation

## 2012-01-13 DIAGNOSIS — E669 Obesity, unspecified: Secondary | ICD-10-CM

## 2012-01-13 DIAGNOSIS — E663 Overweight: Secondary | ICD-10-CM

## 2012-01-13 HISTORY — DX: Overweight: E66.3

## 2012-01-13 HISTORY — DX: Obesity, unspecified: E66.9

## 2012-01-13 HISTORY — DX: Morbid (severe) obesity due to excess calories: E66.01

## 2012-01-13 NOTE — Assessment & Plan Note (Signed)
Asx. Discussed observation vs hematology consult. Pt prefers observation for now. Repeat cbc with next visit

## 2012-01-13 NOTE — Assessment & Plan Note (Signed)
Normotensive and stable. Continue current regimen. Monitor bp as outpt and followup in clinic as scheduled.  

## 2012-01-13 NOTE — Assessment & Plan Note (Signed)
Attempt otc melatonin or valerian

## 2012-01-13 NOTE — Assessment & Plan Note (Signed)
Attempt short term phentermine. Monitor bp and hr as outpt.

## 2012-01-15 ENCOUNTER — Ambulatory Visit: Payer: BC Managed Care – PPO | Admitting: Internal Medicine

## 2012-01-18 ENCOUNTER — Ambulatory Visit: Payer: BC Managed Care – PPO | Admitting: Internal Medicine

## 2012-01-31 NOTE — Telephone Encounter (Signed)
Future orders entered and forwarded to the lab.

## 2012-02-05 ENCOUNTER — Other Ambulatory Visit: Payer: Self-pay | Admitting: Internal Medicine

## 2012-02-05 NOTE — Telephone Encounter (Signed)
Rx done/SLS 

## 2012-03-27 ENCOUNTER — Other Ambulatory Visit: Payer: Self-pay | Admitting: Internal Medicine

## 2012-04-11 ENCOUNTER — Other Ambulatory Visit: Payer: Self-pay | Admitting: Internal Medicine

## 2012-04-11 NOTE — Telephone Encounter (Signed)
Phentermine short-term trial at 08.16.13 OV/SLS Please Advise.

## 2012-04-11 NOTE — Telephone Encounter (Signed)
#  30 no rf 

## 2012-04-11 NOTE — Telephone Encounter (Signed)
Rx to pharmacy/SLS 

## 2012-04-18 ENCOUNTER — Encounter: Payer: Self-pay | Admitting: Internal Medicine

## 2012-06-11 ENCOUNTER — Other Ambulatory Visit: Payer: Self-pay | Admitting: Internal Medicine

## 2012-06-11 NOTE — Telephone Encounter (Signed)
Phentermine request [Last Rx 11.15.13 #30x0]/SLS Please advise.

## 2012-06-12 NOTE — Telephone Encounter (Signed)
Ok  #30 

## 2012-06-12 NOTE — Telephone Encounter (Signed)
Rx phoned to pharmacy/SLS 

## 2012-06-27 ENCOUNTER — Other Ambulatory Visit: Payer: Self-pay | Admitting: Internal Medicine

## 2012-07-14 ENCOUNTER — Ambulatory Visit: Payer: BC Managed Care – PPO | Admitting: Family Medicine

## 2012-07-15 ENCOUNTER — Telehealth: Payer: Self-pay

## 2012-07-15 DIAGNOSIS — Z79899 Other long term (current) drug therapy: Secondary | ICD-10-CM

## 2012-07-15 DIAGNOSIS — E785 Hyperlipidemia, unspecified: Secondary | ICD-10-CM

## 2012-07-15 DIAGNOSIS — I10 Essential (primary) hypertension: Secondary | ICD-10-CM

## 2012-07-15 LAB — HEPATIC FUNCTION PANEL
ALT: 26 U/L (ref 0–35)
AST: 22 U/L (ref 0–37)
Albumin: 4.7 g/dL (ref 3.5–5.2)
Alkaline Phosphatase: 52 U/L (ref 39–117)
Total Bilirubin: 0.5 mg/dL (ref 0.3–1.2)

## 2012-07-15 LAB — CBC
MCH: 33.5 pg (ref 26.0–34.0)
MCHC: 34.7 g/dL (ref 30.0–36.0)
MCV: 96.4 fL (ref 78.0–100.0)
Platelets: 243 10*3/uL (ref 150–400)
RDW: 14.7 % (ref 11.5–15.5)

## 2012-07-15 LAB — LIPID PANEL
Cholesterol: 179 mg/dL (ref 0–200)
HDL: 85 mg/dL (ref >39)

## 2012-07-15 LAB — BASIC METABOLIC PANEL
CO2: 28 mEq/L (ref 19–32)
Calcium: 10.2 mg/dL (ref 8.4–10.5)
Creat: 0.78 mg/dL (ref 0.50–1.10)

## 2012-07-15 NOTE — Telephone Encounter (Signed)
Pt came in today for labs. Labs ordered 

## 2012-07-22 ENCOUNTER — Encounter: Payer: Self-pay | Admitting: Family Medicine

## 2012-07-22 ENCOUNTER — Ambulatory Visit (INDEPENDENT_AMBULATORY_CARE_PROVIDER_SITE_OTHER): Payer: BC Managed Care – PPO | Admitting: Family Medicine

## 2012-07-22 VITALS — BP 118/84 | HR 91 | Temp 98.2°F | Ht 67.5 in | Wt 184.0 lb

## 2012-07-22 DIAGNOSIS — I10 Essential (primary) hypertension: Secondary | ICD-10-CM

## 2012-07-22 DIAGNOSIS — E663 Overweight: Secondary | ICD-10-CM

## 2012-07-22 MED ORDER — PHENTERMINE HCL 37.5 MG PO CAPS: 37.5000 mg | ORAL_CAPSULE | ORAL | Status: AC

## 2012-07-22 NOTE — Patient Instructions (Addendum)
[facility] labs   Next visit annual with GYN  Try Zytec 10 mg once to twice daily for the hives   Hives Hives are itchy, red, swollen areas of the skin. They can vary in size and location on your body. Hives can come and go for hours or several days (acute hives) or for several weeks (chronic hives). Hives do not spread from person to person (noncontagious). They may get worse with scratching, exercise, and emotional stress. CAUSES   Allergic reaction to food, additives, or drugs.  Infections, including the common cold.  Illness, such as vasculitis, lupus, or thyroid disease.  Exposure to sunlight, heat, or cold.  Exercise.  Stress.  Contact with chemicals. SYMPTOMS   Red or white swollen patches on the skin. The patches may change size, shape, and location quickly and repeatedly.  Itching.  Swelling of the hands, feet, and face. This may occur if hives develop deeper in the skin. DIAGNOSIS  Your caregiver can usually tell what is wrong by performing a physical exam. Skin or blood tests may also be done to determine the cause of your hives. In some cases, the cause cannot be determined. TREATMENT  Mild cases usually get better with medicines such as antihistamines. Severe cases may require an emergency epinephrine injection. If the cause of your hives is known, treatment includes avoiding that trigger.  HOME CARE INSTRUCTIONS   Avoid causes that trigger your hives.  Take antihistamines as directed by your caregiver to reduce the severity of your hives. Non-sedating or low-sedating antihistamines are usually recommended. Do not drive while taking an antihistamine.  Take any other medicines prescribed for itching as directed by your caregiver.  Wear loose-fitting clothing.  Keep all follow-up appointments as directed by your caregiver. SEEK MEDICAL CARE IF:   You have persistent or severe itching that is not relieved with medicine.  You have  painful or swollen joints. SEEK IMMEDIATE MEDICAL CARE IF:   You have a fever.  Your tongue or lips are swollen.  You have trouble breathing or swallowing.  You feel tightness in the throat or chest.  You have abdominal pain. These problems may be the first sign of a life-threatening allergic reaction. Call your local emergency services (911 in [place] MAKE SURE YOU:   Understand these instructions.  Will watch your condition.  Will get help right away if you are not doing well or get worse. Document Released: 05/14/2005 Document Revised: 11/13/2011 Document Reviewed: 08/07/2011 [facility] Patient Information 2013 [place], [place].

## 2012-07-27 ENCOUNTER — Encounter: Payer: Self-pay | Admitting: Family Medicine

## 2012-07-27 NOTE — Assessment & Plan Note (Signed)
Well treated with Atorvastatin

## 2012-07-27 NOTE — Assessment & Plan Note (Signed)
Well controlled today no changes 

## 2012-07-27 NOTE — Assessment & Plan Note (Signed)
Recurs but responds to steroids, encouraged antihistamines and adequate hydration

## 2012-07-27 NOTE — Progress Notes (Signed)
Patient ID: Latoya [name], female   DOB: 03-18-1956, 57 y.o.   MRN: [id number] Sharline Lehane [id number] 11/28/1955 07/27/2012      Progress Note-Follow Up  Subjective  Chief Complaint  Chief Complaint  Patient presents with  . Follow-up    6 month    HPI  Patient is a 57 year old Caucasian female in today for followup overall doing well. She's recently had a separation associate under a great of stress but is feeling her health is good. She's using some topical treatments with good results. No fevers, headaches, chest pain, palpitations, shortness of breath, GI or GU concerns noted today.  Past Medical History  Diagnosis Date  . Hyperlipidemia   . Hypertension   . Cancer 12/2009    BCC  . Dermatitis 12/03/2007    Qualifier: Diagnosis of  By: Janit Bern      Past Surgical History  Procedure Laterality Date  . Dilation and currettage    . Mohs surgery  12/2009    basal cell     Family History  Problem Relation Age of Onset  . Heart disease Mother   . Diabetes Mother   . Heart disease Father   . Diabetes Father   . Melanoma      History   Social History  . Marital Status: Married    Spouse Name: N/A    Number of Children: N/A  . Years of Education: N/A   Occupational History  . 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 Smoking status: Former Games developer  . Smokeless tobacco: Never Used  . Alcohol Use: Yes  . Drug Use: No  . Sexually Active: Not on file   Other Topics Concern  . Not on file   Social History Narrative  . No narrative on file    Current Outpatient Prescriptions on File Prior to Visit  Medication Sig Dispense Refill  . aspirin 81 MG tablet Take 81 mg by mouth daily.        Marland Kitchen losartan-hydrochlorothiazide (HYZAAR) 100-25 MG per tablet TAKE 1 TABLET BY MOUTH DAILY  30 tablet  2   No current facility-administered medications on file prior to visit.    No Known Allergies  Review of Systems  Review of Systems  Constitutional:  Negative for fever and malaise/fatigue.  HENT: Negative for congestion.   Eyes: Negative for discharge.  Respiratory: Negative for shortness of breath.   Cardiovascular: Negative for chest pain, palpitations and leg swelling.  Gastrointestinal: Negative for nausea, abdominal pain and diarrhea.  Genitourinary: Negative for dysuria.  Musculoskeletal: Negative for falls.  Skin: Positive for itching and rash.  Neurological: Negative for loss of consciousness and headaches.  Endo/Heme/Allergies: Negative for polydipsia.  Psychiatric/Behavioral: Negative for depression and suicidal ideas. The patient is not nervous/anxious and does not have insomnia.     Objective  BP 118/84  Pulse 91  Temp(Src) 98.2 F (36.8 C) (Oral)  Ht 5' 7.5" (1.715 m)  Wt 184 lb (83.462 kg)  BMI 28.38 kg/m2  SpO2 95%  Physical Exam  Physical Exam  Constitutional: She is oriented to person, place, and time and well-developed, well-nourished, and in no distress. No distress.  HENT:  Head: Normocephalic and atraumatic.  Eyes: Conjunctivae are normal.  Neck: Neck supple. No thyromegaly present.  Cardiovascular: Normal rate, regular rhythm and normal heart sounds.   No murmur heard. Pulmonary/Chest: Effort normal and breath sounds normal. She has no wheezes.  Abdominal: She exhibits no distension and no  mass.  Musculoskeletal: She exhibits no edema.  Lymphadenopathy:    She has no cervical adenopathy.  Neurological: She is alert and oriented to person, place, and time.  Skin: Skin is warm and dry. No rash noted. She is not diaphoretic.  Psychiatric: Memory, affect and judgment normal.    Lab Results  Component Value Date   TSH 0.86 11/03/2010   Lab Results  Component Value Date   WBC 5.4 07/15/2012   HGB 14.9 07/15/2012   HCT 42.9 07/15/2012   MCV 96.4 07/15/2012   PLT 243 07/15/2012   Lab Results  Component Value Date   CREATININE 0.78 07/15/2012   BUN 15 07/15/2012   NA 142 07/15/2012   K 4.9  07/15/2012   CL 102 07/15/2012   CO2 28 07/15/2012   Lab Results  Component Value Date   ALT 26 07/15/2012   AST 22 07/15/2012   ALKPHOS 52 07/15/2012   BILITOT 0.5 07/15/2012   Lab Results  Component Value Date   CHOL 179 07/15/2012   Lab Results  Component Value Date   HDL 85 07/15/2012   Lab Results  Component Value Date   LDLCALC 83 07/15/2012   Lab Results  Component Value Date   TRIG 54 07/15/2012   Lab Results  Component Value Date   CHOLHDL 2.1 07/15/2012     Assessment & Plan  HYPERTENSION Well controlled today no changes  Dermatitis Recurs but responds to steroids, encouraged antihistamines and adequate hydration  HYPERLIPIDEMIA Well treated with Atorvastatin

## 2012-10-04 ENCOUNTER — Other Ambulatory Visit: Payer: Self-pay | Admitting: Internal Medicine

## 2012-10-04 ENCOUNTER — Other Ambulatory Visit: Payer: Self-pay | Admitting: Family Medicine

## 2012-10-13 ENCOUNTER — Encounter: Payer: BC Managed Care – PPO | Admitting: Vascular Surgery

## 2012-11-02 ENCOUNTER — Other Ambulatory Visit: Payer: Self-pay | Admitting: Internal Medicine

## 2012-11-03 ENCOUNTER — Encounter: Payer: BC Managed Care – PPO | Admitting: Surgery

## 2012-11-12 ENCOUNTER — Encounter: Payer: Self-pay | Admitting: Vascular Surgery

## 2012-11-13 ENCOUNTER — Encounter (INDEPENDENT_AMBULATORY_CARE_PROVIDER_SITE_OTHER): Payer: BC Managed Care – PPO | Admitting: *Deleted

## 2012-11-13 ENCOUNTER — Encounter: Payer: Self-pay | Admitting: Vascular Surgery

## 2012-11-13 ENCOUNTER — Ambulatory Visit (INDEPENDENT_AMBULATORY_CARE_PROVIDER_SITE_OTHER): Payer: BC Managed Care – PPO | Admitting: Vascular Surgery

## 2012-11-13 VITALS — BP 120/74 | HR 87 | Resp 18 | Ht 68.0 in | Wt 173.7 lb

## 2012-11-13 DIAGNOSIS — M79609 Pain in unspecified limb: Secondary | ICD-10-CM

## 2012-11-13 DIAGNOSIS — I83893 Varicose veins of bilateral lower extremities with other complications: Secondary | ICD-10-CM | POA: Insufficient documentation

## 2012-11-13 NOTE — Progress Notes (Signed)
[facility] [place] HISTORY AND PHYSICAL   History of Present Illness:  Patient is a 57 y.o. year old female who presents for evaluation of left leg symptomatic varicose veins.  Other medical problems include hyperlipidemia hypertension both of which are currently controlled. Patient complains of a burning and warm sensation over a cluster of veins in her left medial calf. This is frequently irritated if something brushes across the veins or even during driving.  She takes some occasional Tylenol if necessary. She states is been present for several years. She has family history of varicose veins in her father. She denies prior history of DVT. She did have some trauma to this area as a child consisting of a large abrasion. She has had no prior lower extremity procedures. She is a former smoker quit 6 years ago.  Past Medical History  Diagnosis Date  . Hyperlipidemia   . Hypertension   . Cancer 12/2009    BCC  . Dermatitis 12/03/2007    Qualifier: Diagnosis of  By: Janit Bern      Past Surgical History  Procedure Laterality Date  . Dilation and currettage    . Mohs surgery  12/2009    basal cell      Social History History  Substance Use Topics  . Smoking status: Former Games developer  . Smokeless tobacco: Never Used  . Alcohol Use: Yes    Family History Family History  Problem Relation Age of Onset  . Heart disease Mother   . Diabetes Mother   . Heart disease Father   . Diabetes Father   . Melanoma      Allergies  No Known Allergies   Current Outpatient Prescriptions  Medication Sig Dispense Refill  . atorvastatin (LIPITOR) 40 MG tablet Take 1 tablet (40 mg total) by mouth daily.  30 tablet  1  . cetirizine (ZYRTEC) 5 MG tablet Take 5 mg by mouth daily.      Marland Kitchen losartan-hydrochlorothiazide (HYZAAR) 100-25 MG per tablet Take 1 tablet by mouth daily.  30 tablet  5  . phentermine 37.5 MG capsule Take 37.5 mg by mouth daily. Takes 1/2 tablet daily.      Marland Kitchen  aspirin 81 MG tablet Take 81 mg by mouth daily.         No current facility-administered medications for this visit.    ROS:   General:  No weight loss, Fever, chills  HEENT: No recent headaches, no nasal bleeding, no visual changes, no sore throat  Neurologic: No dizziness, blackouts, seizures. No recent symptoms of stroke or mini- stroke. No recent episodes of slurred speech, or temporary blindness.  Cardiac: No recent episodes of chest pain/pressure, no shortness of breath at rest.  No shortness of breath with exertion.  Denies history of atrial fibrillation or irregular heartbeat  Vascular: No history of rest pain in feet.  No history of claudication.  No history of non-healing ulcer, No history of DVT   Pulmonary: No home oxygen, no productive cough, no hemoptysis,  No asthma or wheezing  Musculoskeletal:  [ ]  Arthritis, [ ]  Low back pain,  [ ]  Joint pain  Hematologic:No history of hypercoagulable state.  No history of easy bleeding.  No history of anemia  Gastrointestinal: No hematochezia or melena,  No gastroesophageal reflux, no trouble swallowing  Urinary: [ ]  chronic Kidney disease, [ ]  on HD - [ ]  MWF or [ ]  TTHS, [ ]  Burning with urination, [ ]  Frequent urination, [ ]   Difficulty urinating;   Skin: No rashes  Psychological: No history of anxiety,  No history of depression   Physical Examination  Filed Vitals:   11/13/12 1434  BP: 120/74  Pulse: 87  Resp: 18  Height: 5\' 8"  (1.727 m)  Weight: 173 lb 11.2 oz (78.79 kg)    Body mass index is 26.42 kg/(m^2).  General:  Alert and oriented, no acute distress HEENT: Normal Neck: No bruit or JVD Pulmonary: Clear to auscultation bilaterally Cardiac: Regular Rate and Rhythm without murmur Abdomen: Soft, non-tender, non-distended, no mass Skin: No rash, small scattered spider veins right medial and anterior calf, cluster of reticular and spider varicosities left medial calf which is warm to touch no palpable cord no  other obvious significant varicosities Extremity Pulses:  2+ radial, brachial, femoral, absent dorsalis pedis, 2+ posterior tibial pulses bilaterally Musculoskeletal: No deformity or edema  Neurologic: Upper and lower extremity motor 5/5 and symmetric  DATA:   The patient had a venous duplex exam today which showed no evidence of DVT. She did have some common femoral vein reflux. She had very mild reflux in the greater saphenous vein in the calf. Vein diameter in this location was less than 3 mm.   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 I also discussed with her doing sclerotherapy of this cluster of varicosities that is causing her problems in the left leg. She will call to schedule this later in the year with her work schedule is less busy and the weather is cooler outside  Fabienne Bruns, MD [facility] [place] Office: [phone] Pager: [phone]

## 2012-12-29 ENCOUNTER — Telehealth: Payer: Self-pay | Admitting: Family Medicine

## 2012-12-29 DIAGNOSIS — E785 Hyperlipidemia, unspecified: Secondary | ICD-10-CM

## 2012-12-29 DIAGNOSIS — I10 Essential (primary) hypertension: Secondary | ICD-10-CM

## 2012-12-29 NOTE — Telephone Encounter (Signed)
Given her diagnosis of htn and hyperlipids, can have renal, cbc, hepatic, tsh, lipid

## 2012-12-29 NOTE — Telephone Encounter (Signed)
Patient had labs done on 07-15-12. Last encounter just states next visit needs to be a pap.  Pt would like to come in tomorrow morning (appt tomorrow afternoon) for labs?   Please advise if these need to be done? If so, which labs and diagnosis

## 2012-12-29 NOTE — Telephone Encounter (Signed)
Patient has cpe tomorrow afternoon and is coming in tomorrow morning for labwork. Please order labs. She will be going to [facility] lab

## 2012-12-30 ENCOUNTER — Ambulatory Visit (INDEPENDENT_AMBULATORY_CARE_PROVIDER_SITE_OTHER): Payer: BC Managed Care – PPO | Admitting: Family Medicine

## 2012-12-30 ENCOUNTER — Other Ambulatory Visit (HOSPITAL_COMMUNITY)
Admission: RE | Admit: 2012-12-30 | Discharge: 2012-12-30 | Disposition: A | Discharge status: Home / Self Care | Payer: BC Managed Care – PPO | Source: Ambulatory Visit | Attending: Family Medicine | Admitting: Family Medicine

## 2012-12-30 ENCOUNTER — Encounter: Payer: Self-pay | Admitting: Family Medicine

## 2012-12-30 VITALS — BP 110/74 | HR 90 | Temp 98.1°F | Ht 67.5 in | Wt 174.0 lb

## 2012-12-30 DIAGNOSIS — E785 Hyperlipidemia, unspecified: Secondary | ICD-10-CM

## 2012-12-30 DIAGNOSIS — Z01419 Encounter for gynecological examination (general) (routine) without abnormal findings: Secondary | ICD-10-CM | POA: Insufficient documentation

## 2012-12-30 DIAGNOSIS — L309 Dermatitis, unspecified: Secondary | ICD-10-CM

## 2012-12-30 DIAGNOSIS — Z85828 Personal history of other malignant neoplasm of skin: Secondary | ICD-10-CM

## 2012-12-30 DIAGNOSIS — E669 Obesity, unspecified: Secondary | ICD-10-CM

## 2012-12-30 DIAGNOSIS — L259 Unspecified contact dermatitis, unspecified cause: Secondary | ICD-10-CM

## 2012-12-30 DIAGNOSIS — I10 Essential (primary) hypertension: Secondary | ICD-10-CM

## 2012-12-30 DIAGNOSIS — Z0000 Encounter for general adult medical examination without abnormal findings: Secondary | ICD-10-CM

## 2012-12-30 DIAGNOSIS — Z124 Encounter for screening for malignant neoplasm of cervix: Secondary | ICD-10-CM

## 2012-12-30 DIAGNOSIS — Z23 Encounter for immunization: Secondary | ICD-10-CM

## 2012-12-30 HISTORY — DX: Encounter for screening for malignant neoplasm of cervix: Z12.4

## 2012-12-30 LAB — RENAL FUNCTION PANEL
CO2: 31 mEq/L (ref 19–32)
Chloride: 100 mEq/L (ref 96–112)
Creat: 0.92 mg/dL (ref 0.50–1.10)
Phosphorus: 4.4 mg/dL (ref 2.3–4.6)
Potassium: 4.7 mEq/L (ref 3.5–5.3)
Sodium: 139 mEq/L (ref 135–145)

## 2012-12-30 LAB — HEPATIC FUNCTION PANEL
ALT: 30 U/L (ref 0–35)
AST: 21 U/L (ref 0–37)
Alkaline Phosphatase: 43 U/L (ref 39–117)
Bilirubin, Direct: 0.1 mg/dL (ref 0.0–0.3)
Indirect Bilirubin: 0.5 mg/dL (ref 0.0–0.9)
Total Bilirubin: 0.6 mg/dL (ref 0.3–1.2)

## 2012-12-30 LAB — CBC
HCT: 42.4 % (ref 36.0–46.0)
Hemoglobin: 14.4 g/dL (ref 12.0–15.0)
MCV: 97.5 fL (ref 78.0–100.0)
RBC: 4.35 MIL/uL (ref 3.87–5.11)
WBC: 5.4 10*3/uL (ref 4.0–10.5)

## 2012-12-30 LAB — LIPID PANEL
Cholesterol: 185 mg/dL (ref 0–200)
LDL Cholesterol: 94 mg/dL (ref 0–99)
Total CHOL/HDL Ratio: 2.3 Ratio
VLDL: 12 mg/dL (ref 0–40)

## 2012-12-30 NOTE — Patient Instructions (Addendum)
Rel of [facility] for labs  Labs hepatic, renal, cbc, tsh, lipid prior at next  Hypertension As your heart beats, it forces blood through your arteries. This force is your blood pressure. If the pressure is too high, it is called hypertension (HTN) or high blood pressure. HTN is dangerous because you may have it and not know it. High blood pressure may mean that your heart has to work harder to pump blood. Your arteries may be narrow or stiff. The extra work puts you at risk for heart disease, stroke, and other problems.  Blood pressure consists of two numbers, a higher number over a lower, 110/72, for example. It is stated as "110 over 72." The ideal is below 120 for the top number (systolic) and under 80 for the bottom (diastolic). Write down your blood pressure today. You should pay close attention to your blood pressure if you have certain conditions such as:  Heart failure.  Prior heart attack.  Diabetes  Chronic kidney disease.  Prior stroke.  Multiple risk factors for heart disease. To see if you have HTN, your blood pressure should be measured while you are seated with your arm held at the level of the heart. It should be measured at least twice. A one-time elevated blood pressure reading (especially in the Emergency Department) does not mean that you need treatment. There may be conditions in which the blood pressure is different between your right and left arms. It is important to see your caregiver soon for a recheck. Most people have essential hypertension which means that there is not a specific cause. This type of high blood pressure may be lowered by changing lifestyle factors such as:  Stress.  Smoking.  Lack of exercise.  Excessive weight.  Drug/tobacco/alcohol use.  Eating less salt. Most people do not have symptoms from high blood pressure until it has caused damage to the body. Effective treatment can often prevent, delay or reduce that  damage. TREATMENT  When a cause has been identified, treatment for high blood pressure is directed at the cause. There are a large number of medications to treat HTN. These fall into several categories, and your caregiver will help you select the medicines that are best for you. Medications may have side effects. You should review side effects with your caregiver. If your blood pressure stays high after you have made lifestyle changes or started on medicines,   Your medication(s) may need to be changed.  Other problems may need to be addressed.  Be certain you understand your prescriptions, and know how and when to take your medicine.  Be sure to follow up with your caregiver within the time frame advised (usually within two weeks) to have your blood pressure rechecked and to review your medications.  If you are taking more than one medicine to lower your blood pressure, make sure you know how and at what times they should be taken. Taking two medicines at the same time can result in blood pressure that is too low. SEEK IMMEDIATE MEDICAL CARE IF:  You develop a severe headache, blurred or changing vision, or confusion.  You have unusual weakness or numbness, or a faint feeling.  You have severe chest or abdominal pain, vomiting, or breathing problems. MAKE SURE YOU:   Understand these instructions.  Will watch your condition.  Will get help right away if you are not doing well or get worse. Document Released: 05/14/2005 Document Revised: 08/06/2011 Document Reviewed: 01/02/2008 ExitCare Patient Information  2014 [employer]  

## 2012-12-30 NOTE — Assessment & Plan Note (Addendum)
Pap today, previous paps at physician's for wormen

## 2013-01-04 ENCOUNTER — Encounter: Payer: Self-pay | Admitting: Family Medicine

## 2013-01-04 DIAGNOSIS — L308 Other specified dermatitis: Secondary | ICD-10-CM | POA: Insufficient documentation

## 2013-01-04 DIAGNOSIS — L309 Dermatitis, unspecified: Secondary | ICD-10-CM

## 2013-01-04 DIAGNOSIS — Z0000 Encounter for general adult medical examination without abnormal findings: Secondary | ICD-10-CM | POA: Insufficient documentation

## 2013-01-04 HISTORY — DX: Dermatitis, unspecified: L30.9

## 2013-01-04 HISTORY — DX: Encounter for general adult medical examination without abnormal findings: Z00.00

## 2013-01-04 NOTE — Assessment & Plan Note (Signed)
usinge Phentermine 1/2 tab with good appetite suprression, encouraged DASH diet and exercise

## 2013-01-04 NOTE — Progress Notes (Signed)
Patient ID: Latoya [name], female   DOB: 08-15-1955, 57 y.o.   MRN: [id number] Latoya [name] [id number] 09-18-1955 01/04/2013      Progress Note-Follow Up  Subjective  Chief Complaint  Chief Complaint  Patient presents with  . Annual Exam    physical  . Gynecologic Exam    pap   HPI    Patient is a 57 year old female in today for routine medical care. She needs her annual GYN exam. She will transfer that care here. She does not have significant acute complaints. She has occasional flare in her eczema which she uses clobetasol cream 4 with good results. No recent illness. No chest pain or palpitations. No shortness of breath GI or GU complaints. Takes medications as prescribed no recent flare in allergies  Past Medical History  Diagnosis Date  . Hyperlipidemia   . Hypertension   . Dermatitis 12/03/2007    Qualifier: Diagnosis of  By: Janit Bern    . Cervical cancer screening 12/30/2012  . Cancer 12/2009    BCC, SCC  . Eczema 01/04/2013  . Preventative health care 01/04/2013    Past Surgical History  Procedure Laterality Date  . Dilation and currettage    . Mohs surgery  12/2009    basal cell     Family History  Problem Relation Age of Onset  . Heart disease Mother   . Diabetes Mother   . Heart disease Father     pacer  . Diabetes Father   . Hypertension Father     weight controlled  . Obesity Father   . Melanoma    . Obesity Sister     s/p lap band  . Cancer Maternal Grandmother     pancreatic  . Cancer Paternal Grandmother 26    liver  . Psoriasis Brother   . Obesity Brother   . Obesity Brother   . Diabetes Brother     diet controlled    History   Social History  . Marital Status: Married    Spouse Name: N/A    Number of Children: N/A  . Years of Education: N/A   Occupational History  . 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 Smoking status: Former Games developer  . Smokeless tobacco: Never Used  . Alcohol Use: Yes  . Drug Use: No  . Sexually  Active: Not on file   Other Topics Concern  . Not on file   Social History Narrative  . No narrative on file    Current Outpatient Prescriptions on File Prior to Visit  Medication Sig Dispense Refill  . aspirin 81 MG tablet Take 81 mg by mouth daily.        Marland Kitchen atorvastatin (LIPITOR) 40 MG tablet Take 1 tablet (40 mg total) by mouth daily.  30 tablet  1  . cetirizine (ZYRTEC) 5 MG tablet Take 5 mg by mouth daily.      Marland Kitchen losartan-hydrochlorothiazide (HYZAAR) 100-25 MG per tablet Take 1 tablet by mouth daily.  30 tablet  5  . phentermine 37.5 MG capsule Take 37.5 mg by mouth daily. Takes 1/2 tablet daily.       No current facility-administered medications on file prior to visit.    No Known Allergies  Review of Systems  Review of Systems  Constitutional: Negative for fever and malaise/fatigue.  HENT: Negative for congestion.   Eyes: Negative for discharge.  Respiratory: Negative for shortness of breath.   Cardiovascular: Negative for chest pain, palpitations  and leg swelling.  Gastrointestinal: Negative for nausea, abdominal pain and diarrhea.  Genitourinary: Negative for dysuria.  Musculoskeletal: Negative for falls.  Skin: Negative for rash.  Neurological: Negative for loss of consciousness and headaches.  Endo/Heme/Allergies: Negative for polydipsia.  Psychiatric/Behavioral: Negative for depression and suicidal ideas. The patient is not nervous/anxious and does not have insomnia.     Objective  BP 110/74  Pulse 90  Temp(Src) 98.1 F (36.7 C) (Oral)  Ht 5' 7.5" (1.715 m)  Wt 174 lb (78.926 kg)  BMI 26.83 kg/m2  SpO2 95%  Physical Exam  Physical Exam  Constitutional: She is oriented to person, place, and time and well-developed, well-nourished, and in no distress. No distress.  HENT:  Head: Normocephalic and atraumatic.  Eyes: Conjunctivae are normal.  Neck: Neck supple. No thyromegaly present.  Cardiovascular: Normal rate, regular rhythm and normal heart  sounds.   No murmur heard. Pulmonary/Chest: Effort normal and breath sounds normal. She has no wheezes.  Abdominal: She exhibits no distension and no mass.  Musculoskeletal: She exhibits no edema.  Lymphadenopathy:    She has no cervical adenopathy.  Neurological: She is alert and oriented to person, place, and time.  Skin: Skin is warm and dry. No rash noted. She is not diaphoretic.  Psychiatric: Memory, affect and judgment normal.    Lab Results  Component Value Date   TSH 1.293 12/30/2012   Lab Results  Component Value Date   WBC 5.4 12/30/2012   HGB 14.4 12/30/2012   HCT 42.4 12/30/2012   MCV 97.5 12/30/2012   PLT 256 12/30/2012   Lab Results  Component Value Date   CREATININE 0.92 12/30/2012   BUN 19 12/30/2012   NA 139 12/30/2012   K 4.7 12/30/2012   CL 100 12/30/2012   CO2 31 12/30/2012   Lab Results  Component Value Date   ALT 30 12/30/2012   AST 21 12/30/2012   ALKPHOS 43 12/30/2012   BILITOT 0.6 12/30/2012   Lab Results  Component Value Date   CHOL 185 12/30/2012   Lab Results  Component Value Date   HDL 79 12/30/2012   Lab Results  Component Value Date   LDLCALC 94 12/30/2012   Lab Results  Component Value Date   TRIG 60 12/30/2012   Lab Results  Component Value Date   CHOLHDL 2.3 12/30/2012     Assessment & Plan  Cervical cancer screening Pap today, previous paps at physician's for wormen  HYPERTENSION Well controlled no changes.  HYPERLIPIDEMIA Adequate control with Atorvastatin. Avoid trans fats. krill oil caps  SKIN CANCER, HX OF Follows with dermatology  Eczema Using Clobetasol cream prn.   Preventative health care Encouraged DASH diet, given Tdap shot today. encouraged heart healthy diet and increased exercise.  Obesity usinge Phentermine 1/2 tab with good appetite suprression, encouraged DASH diet and exercise

## 2013-01-04 NOTE — Assessment & Plan Note (Signed)
Encouraged DASH diet, given Tdap shot today. encouraged heart healthy diet and increased exercise.

## 2013-01-04 NOTE — Assessment & Plan Note (Signed)
Adequate control with Atorvastatin. Avoid trans fats. krill oil caps

## 2013-01-04 NOTE — Assessment & Plan Note (Signed)
Follows with dermatology 

## 2013-01-04 NOTE — Assessment & Plan Note (Signed)
Well controlled no changes 

## 2013-01-04 NOTE — Assessment & Plan Note (Signed)
Using Clobetasol cream prn.

## 2013-01-19 ENCOUNTER — Telehealth: Payer: Self-pay | Admitting: Family Medicine

## 2013-01-19 NOTE — Telephone Encounter (Signed)
Received medical records from [facility] ° °P: [phone] °F: [phone] °

## 2013-02-02 ENCOUNTER — Other Ambulatory Visit: Payer: Self-pay | Admitting: *Deleted

## 2013-02-02 MED ORDER — ATORVASTATIN CALCIUM 40 MG PO TABS: 40.0000 mg | ORAL_TABLET | Freq: Every day | ORAL | Status: DC

## 2013-02-02 NOTE — Telephone Encounter (Signed)
Rx request to pharmacy/SLS  

## 2013-02-19 ENCOUNTER — Other Ambulatory Visit: Payer: Self-pay | Admitting: Family Medicine

## 2013-02-19 DIAGNOSIS — Z1231 Encounter for screening mammogram for malignant neoplasm of breast: Secondary | ICD-10-CM

## 2013-03-07 ENCOUNTER — Other Ambulatory Visit: Payer: Self-pay | Admitting: Family Medicine

## 2013-03-09 NOTE — Telephone Encounter (Signed)
Rx request to pharmacy/SLS  

## 2013-03-13 ENCOUNTER — Ambulatory Visit
Admission: RE | Admit: 2013-03-13 | Discharge: 2013-03-13 | Disposition: A | Discharge status: Home / Self Care | Payer: BC Managed Care – PPO | Source: Ambulatory Visit | Attending: Family Medicine | Admitting: Family Medicine

## 2013-03-13 DIAGNOSIS — Z1231 Encounter for screening mammogram for malignant neoplasm of breast: Secondary | ICD-10-CM

## 2013-03-18 ENCOUNTER — Emergency Department (HOSPITAL_BASED_OUTPATIENT_CLINIC_OR_DEPARTMENT_OTHER)
Admission: EM | Admit: 2013-03-18 | Discharge: 2013-03-18 | Disposition: A | Discharge status: Home / Self Care | Payer: BC Managed Care – PPO | Attending: Emergency Medicine | Admitting: Emergency Medicine

## 2013-03-18 ENCOUNTER — Encounter (HOSPITAL_BASED_OUTPATIENT_CLINIC_OR_DEPARTMENT_OTHER): Payer: Self-pay | Admitting: Emergency Medicine

## 2013-03-18 DIAGNOSIS — Z87891 Personal history of nicotine dependence: Secondary | ICD-10-CM | POA: Insufficient documentation

## 2013-03-18 DIAGNOSIS — I10 Essential (primary) hypertension: Secondary | ICD-10-CM | POA: Insufficient documentation

## 2013-03-18 DIAGNOSIS — Z872 Personal history of diseases of the skin and subcutaneous tissue: Secondary | ICD-10-CM | POA: Insufficient documentation

## 2013-03-18 DIAGNOSIS — G479 Sleep disorder, unspecified: Secondary | ICD-10-CM | POA: Insufficient documentation

## 2013-03-18 DIAGNOSIS — Z85828 Personal history of other malignant neoplasm of skin: Secondary | ICD-10-CM | POA: Insufficient documentation

## 2013-03-18 DIAGNOSIS — F410 Panic disorder [episodic paroxysmal anxiety] without agoraphobia: Secondary | ICD-10-CM | POA: Insufficient documentation

## 2013-03-18 DIAGNOSIS — R110 Nausea: Secondary | ICD-10-CM | POA: Insufficient documentation

## 2013-03-18 DIAGNOSIS — Z7982 Long term (current) use of aspirin: Secondary | ICD-10-CM | POA: Insufficient documentation

## 2013-03-18 DIAGNOSIS — E785 Hyperlipidemia, unspecified: Secondary | ICD-10-CM | POA: Insufficient documentation

## 2013-03-18 DIAGNOSIS — Z79899 Other long term (current) drug therapy: Secondary | ICD-10-CM | POA: Insufficient documentation

## 2013-03-18 MED ORDER — ALPRAZOLAM 0.25 MG PO TABS: 0.2500 mg | ORAL_TABLET | Freq: Every evening | ORAL | Status: DC | PRN

## 2013-03-18 NOTE — ED Provider Notes (Signed)
CSN: [id number]     Arrival date & time 03/18/13  1807 History  This chart was scribed for Dagmar Hait, MD by [name], ED Scribe. This patient was seen in room MH07/MH07 and the patient's care was started 7:00 PM.    Chief Complaint  Patient presents with  . Panic Attack   Patient is a 57 y.o. female presenting with anxiety. The history is provided by the patient. No language interpreter was used.  Anxiety This is a new problem. The current episode started more than 1 week ago. The problem occurs constantly. The problem has been gradually worsening. Pertinent negatives include no chest pain and no shortness of breath. Nothing aggravates the symptoms. Nothing relieves the symptoms. She has tried nothing for the symptoms.   HPI Comments: Latoya [name] is a 57 y.o. female who presents to the Emergency Department complaining of excessive stress onset after a divorce in May 2013. Pt states that her mind is constantly racing. She reports being unable to sleep. She has a lot of personal, financial, and work stressors. Pt reports feeling nauseous this morning. She no longer feels that she can function effectively. 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 She denies SI, HI, chest pain, SOB. Pt denies h/o overdoses or admittance into an institution. Pt's PCP is Dr. Danise [name].   Past Medical History  Diagnosis Date  . Hyperlipidemia   . Hypertension   . Dermatitis 12/03/2007    Qualifier: Diagnosis of  By: Janit Bern    . Cervical cancer screening 12/30/2012  . Cancer 12/2009    BCC, SCC  . Eczema 01/04/2013  . Preventative health care 01/04/2013   Past Surgical History  Procedure Laterality Date  . Dilation and currettage    . Mohs surgery  12/2009    basal cell    Family History  Problem Relation Age of Onset  . Heart disease Mother   . Diabetes Mother   . Heart disease Father     pacer  . Diabetes Father   . Hypertension Father     weight controlled  . Obesity  Father   . Melanoma    . Obesity Sister     s/p lap band  . Cancer Maternal Grandmother     pancreatic  . Cancer Paternal Grandmother 21    liver  . Psoriasis Brother   . Obesity Brother   . Obesity Brother   . Diabetes Brother     diet controlled   History  Substance Use Topics  . Smoking status: Former Games developer  . Smokeless tobacco: Never Used  . Alcohol Use: Yes   OB History   Grav Para Term Preterm Abortions TAB SAB Ect Mult Living                 Review of Systems  Respiratory: Negative for shortness of breath.   Cardiovascular: Negative for chest pain.  Psychiatric/Behavioral: Positive for sleep disturbance. Negative for suicidal ideas and self-injury.  All other systems reviewed and are negative.    Allergies  Review of patient's allergies indicates no known allergies.  Home Medications   Current Outpatient Rx  Name  Route  Sig  Dispense  Refill  . ALPRAZolam (XANAX) 0.25 MG tablet   Oral   Take 1-2 tablets (0.25-0.5 mg total) by mouth at bedtime as needed for sleep or anxiety.   20 tablet   0   . aspirin 81 MG tablet   Oral  Take 81 mg by mouth daily.           Marland Kitchen atorvastatin (LIPITOR) 40 MG tablet      TAKE 1 TABLET (40 MG TOTAL) BY MOUTH DAILY.   30 tablet   0     CYCLE FILL MEDICATION. Authorization is required f ...   . cetirizine (ZYRTEC) 5 MG tablet   Oral   Take 5 mg by mouth daily.         Marland Kitchen losartan-hydrochlorothiazide (HYZAAR) 100-25 MG per tablet   Oral   Take 1 tablet by mouth daily.   30 tablet   5     CYCLE FILL MEDICATION.   Marland Kitchen phentermine 37.5 MG capsule   Oral   Take 37.5 mg by mouth daily. Takes 1/2 tablet daily.          Triage Vitals: BP 134/86  Pulse 73  Temp(Src) 98.3 F (36.8 C) (Oral)  Resp 18  Ht 5' 7.5" (1.715 m)  Wt 169 lb (76.658 kg)  BMI 26.06 kg/m2  SpO2 100%  Physical Exam  Nursing note and vitals reviewed. Constitutional: She is oriented to person, place, and time. She appears  well-developed and well-nourished. No distress.  HENT:  Head: Normocephalic and atraumatic.  Eyes: EOM are normal.  Neck: Neck supple. No tracheal deviation present.  Cardiovascular: Normal rate, regular rhythm and normal heart sounds.  Exam reveals no gallop and no friction rub.   No murmur heard. Pulmonary/Chest: Effort normal and breath sounds normal. No respiratory distress. She has no wheezes. She has no rales. She exhibits no tenderness.  Musculoskeletal: Normal range of motion.  Neurological: She is alert and oriented to person, place, and time.  Skin: Skin is warm and dry.  Psychiatric: She has a normal mood and affect. Her behavior is normal.    ED Course  Procedures (including critical care time) DIAGNOSTIC STUDIES: Oxygen Saturation is 100% on room air, normal by my interpretation.    COORDINATION OF CARE: 7:10 PM-Discussed treatment plan which includes Xanax with pt at bedside and pt agreed to plan. Also advised pt to follow up with her PCP in a few days, pt agrees to plan.  Labs Review Labs Reviewed - No data to display Imaging Review No results found.  EKG Interpretation   None       MDM   1. Panic attack    44F here with panic attack. Lots of personal things (divorce, alimony, mortgage) and work related stressors. No CP, SOB. No prior psychiatric problems, no prior SI, overdoses, suicide attempt. No SI/HI here. AFVSS here. Patient obviously stressed, but calm and cooperative with exam. Given Rx for small amount of Xanax to help with her anxiety. Cautioned on not driving, making important decisions, working while taking the Xanax. Discharged home in stable condition.   I personally performed the services described in this documentation, which was scribed in my presence. The recorded information has been reviewed and is accurate.     Dagmar Hait, MD 03/19/13 [name]

## 2013-03-18 NOTE — ED Notes (Signed)
Patient states she is going through a bad divorce and is having problems dealing with all of the stress in her life at this time.  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  Denies any suicidal or homicidal ideations at this time.

## 2013-03-18 NOTE — ED Notes (Signed)
MD at bedside. 

## 2013-03-23 ENCOUNTER — Telehealth: Payer: Self-pay

## 2013-03-23 NOTE — Telephone Encounter (Signed)
Patient left a message stating she went to ER last week and was told to come in for a follow up with MD. Pt states she was diagnosed with anxiety. I called and left a message on patients vm stating that pt needs to call back and dial 1 for appt.

## 2013-04-01 ENCOUNTER — Ambulatory Visit (INDEPENDENT_AMBULATORY_CARE_PROVIDER_SITE_OTHER): Payer: BC Managed Care – PPO | Admitting: Physician Assistant

## 2013-04-01 ENCOUNTER — Encounter: Payer: Self-pay | Admitting: Physician Assistant

## 2013-04-01 VITALS — BP 128/80 | HR 79 | Temp 97.9°F | Resp 16 | Ht 67.5 in | Wt 171.8 lb

## 2013-04-01 DIAGNOSIS — F419 Anxiety disorder, unspecified: Secondary | ICD-10-CM

## 2013-04-01 DIAGNOSIS — F411 Generalized anxiety disorder: Secondary | ICD-10-CM

## 2013-04-01 MED ORDER — ALPRAZOLAM 0.25 MG PO TABS: 0.2500 mg | ORAL_TABLET | Freq: Every evening | ORAL | Status: DC | PRN

## 2013-04-01 NOTE — Patient Instructions (Signed)
Please take medication as prescribed.  I will see you in 1 week for ED follow-up and further management of anxiety.  Please try to take some time for yourself this weekend in [place].  Go have some fun!  It was a pleasure to participate in your care today.  Call if you need anything.

## 2013-04-01 NOTE — Progress Notes (Signed)
Patient ID: Latoya [name], female   DOB: 1955/10/06, 57 y.o.   MRN: [id number]  Patient presents to clinic today for medication management.  Patient seen on 03/18/13 for panic attack.  Was given Rx for Xanax 0.25 to take at bedtime for anxiety.  Patient was given 20 tablets. Patient has not been seen by PCP for ED follow-up.  No available slot today for 30 minute follow-up and in-depth discussion of symptoms, given my current schedule as well as patient heading out of town this afternoon.    Patient endorses history of increased stress/anxiety levels over the past year.  States stressors stem from stressful job, as well as recent separation and divorce from her husband.  States she has always been able to manage her stress and anxiety through her good support network of family and friends, but lately confrontations with her ex-husband have placed her anxiety symptoms out of control.  Patient denies depressed mood or anhedonia, suicidal/homicidal ideations, or history of mental health issue.  Has seen a counselor before, but states it was a Airline pilot.  Patient denies chest pain, shortness of breath, chest tightness, lightheadedness, dizziness or syncope.    Past Medical History  Diagnosis Date  . Hyperlipidemia   . Hypertension   . Dermatitis 12/03/2007    Qualifier: Diagnosis of  By: Janit Bern    . Cervical cancer screening 12/30/2012  . Cancer 12/2009    BCC, SCC  . Eczema 01/04/2013  . Preventative health care 01/04/2013    Current Outpatient Prescriptions on File Prior to Visit  Medication Sig Dispense Refill  . aspirin 81 MG tablet Take 81 mg by mouth daily.        Marland Kitchen atorvastatin (LIPITOR) 40 MG tablet TAKE 1 TABLET (40 MG TOTAL) BY MOUTH DAILY.  30 tablet  0  . cetirizine (ZYRTEC) 5 MG tablet Take 5 mg by mouth daily.      Marland Kitchen losartan-hydrochlorothiazide (HYZAAR) 100-25 MG per tablet Take 1 tablet by mouth daily.  30 tablet  5  . phentermine 37.5 MG capsule Take 37.5 mg by mouth daily.  Takes 1/2 tablet daily.       No current facility-administered medications on file prior to visit.    No Known Allergies  Family History  Problem Relation Age of Onset  . Heart disease Mother   . Diabetes Mother   . Heart disease Father     pacer  . Diabetes Father   . Hypertension Father     weight controlled  . Obesity Father   . Melanoma    . Obesity Sister     s/p lap band  . Cancer Maternal Grandmother     pancreatic  . Cancer Paternal Grandmother 54    liver  . Psoriasis Brother   . Obesity Brother   . Obesity Brother   . Diabetes Brother     diet controlled    History   Social History  . Marital Status: Married    Spouse Name: N/A    Number of Children: N/A  . Years of Education: N/A   Occupational History  . 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 Smoking status: Former Games developer  . Smokeless tobacco: Never Used  . Alcohol Use: Yes  . Drug Use: No  . Sexual Activity: None   Other Topics Concern  . None   Social History Narrative  . None   ROS See HPI.  All other ROS are negative.  Filed Vitals:   04/01/13 1417  BP: 128/80  Pulse: 79  Temp: 97.9 F (36.6 C)  Resp: 16   Physical Exam  Vitals reviewed. Constitutional: She is oriented to person, place, and time and well-developed, well-nourished, and in no distress.  HENT:  Head: Normocephalic and atraumatic.  Eyes: Conjunctivae are normal.  Neck: Neck supple.  Cardiovascular: Normal rate and regular rhythm.   Pulmonary/Chest: Effort normal.  Neurological: She is alert and oriented to person, place, and time.  Skin: Skin is warm and dry. No rash noted.  Psychiatric:  Stressed affect   Assessment/Plan: Anxiety Will refill Xanax #20.  No refills until patient returns for ED follow-up for further evaluation and management.  Discussed in brief, SSRIs and counseling as more effective management for long-term stress/anxiety.  Patient does not have time for longer  appointment today.

## 2013-04-01 NOTE — Assessment & Plan Note (Signed)
Will refill Xanax #20.  No refills until patient returns for ED follow-up for further evaluation and management.  Discussed in brief, SSRIs and counseling as more effective management for long-term stress/anxiety.  Patient does not have time for longer appointment today.

## 2013-04-06 ENCOUNTER — Encounter: Payer: Self-pay | Admitting: Physician Assistant

## 2013-04-06 ENCOUNTER — Ambulatory Visit (INDEPENDENT_AMBULATORY_CARE_PROVIDER_SITE_OTHER): Payer: BC Managed Care – PPO | Admitting: Physician Assistant

## 2013-04-06 VITALS — BP 118/72 | HR 75 | Temp 97.6°F | Ht 67.5 in | Wt 172.8 lb

## 2013-04-06 DIAGNOSIS — E785 Hyperlipidemia, unspecified: Secondary | ICD-10-CM

## 2013-04-06 DIAGNOSIS — I10 Essential (primary) hypertension: Secondary | ICD-10-CM

## 2013-04-06 DIAGNOSIS — F419 Anxiety disorder, unspecified: Secondary | ICD-10-CM

## 2013-04-06 DIAGNOSIS — F411 Generalized anxiety disorder: Secondary | ICD-10-CM

## 2013-04-06 MED ORDER — ALPRAZOLAM 0.25 MG PO TABS: 0.2500 mg | ORAL_TABLET | Freq: Every evening | ORAL | Status: DC | PRN

## 2013-04-06 MED ORDER — CITALOPRAM HYDROBROMIDE 10 MG PO TABS: ORAL_TABLET | ORAL | Status: DC

## 2013-04-06 MED ORDER — LOSARTAN POTASSIUM-HCTZ 100-25 MG PO TABS: 1.0000 | ORAL_TABLET | Freq: Every day | ORAL | Status: DC

## 2013-04-06 MED ORDER — ATORVASTATIN CALCIUM 40 MG PO TABS: 40.0000 mg | ORAL_TABLET | Freq: Every day | ORAL | Status: DC

## 2013-04-06 NOTE — Progress Notes (Signed)
Patient ID: Latoya [name], female   DOB: 11/08/1955, 57 y.o.   MRN: [id number]   Patient presents to clinic today for ED follow-up and discussion of long-term medications for anxiety.  Patient states that she has been using the Xanax daily since last visit.  She does like that it takes the anxiety away, but she realizes she suffers from long-term generalized anxiety and not just panic attacks.  Patient denies depression or anhedonia.  Denies SI.  Denies history of mental illness.  Patient is amenable to seeing a therapist in addition to long-term medication management for anxiety.     Past Medical History  Diagnosis Date  . Hyperlipidemia   . Hypertension   . Dermatitis 12/03/2007    Qualifier: Diagnosis of  By: Janit Bern    . Cervical cancer screening 12/30/2012  . Cancer 12/2009    BCC, SCC  . Eczema 01/04/2013  . Preventative health care 01/04/2013    Current Outpatient Prescriptions on File Prior to Visit  Medication Sig Dispense Refill  . ALPRAZolam (XANAX) 0.25 MG tablet Take 1-2 tablets (0.25-0.5 mg total) by mouth at bedtime as needed for sleep or anxiety.  20 tablet  0  . aspirin 81 MG tablet Take 81 mg by mouth daily.        Marland Kitchen atorvastatin (LIPITOR) 40 MG tablet TAKE 1 TABLET (40 MG TOTAL) BY MOUTH DAILY.  30 tablet  0  . cetirizine (ZYRTEC) 5 MG tablet Take 5 mg by mouth daily.      Marland Kitchen losartan-hydrochlorothiazide (HYZAAR) 100-25 MG per tablet Take 1 tablet by mouth daily.  30 tablet  5  . phentermine 37.5 MG capsule Take 37.5 mg by mouth daily. Takes 1/2 tablet daily.       No current facility-administered medications on file prior to visit.    No Known Allergies  Family History  Problem Relation Age of Onset  . Heart disease Mother   . Diabetes Mother   . Heart disease Father     pacer  . Diabetes Father   . Hypertension Father     weight controlled  . Obesity Father   . Melanoma    . Obesity Sister     s/p lap band  . Cancer Maternal Grandmother     pancreatic  .  Cancer Paternal Grandmother 30    liver  . Psoriasis Brother   . Obesity Brother   . Obesity Brother   . Diabetes Brother     diet controlled    History   Social History  . Marital Status: Married    Spouse Name: N/A    Number of Children: N/A  . Years of Education: N/A   Occupational History  . 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 Smoking status: Former Games developer  . Smokeless tobacco: Never Used  . Alcohol Use: Yes  . Drug Use: No  . Sexual Activity: Not on file   Other Topics Concern  . Not on file   Social History Narrative  . No narrative on file   ROS See HPI.  All other ROS are negative.  There were no vitals filed for this visit.  Physical Exam  Vitals reviewed. Constitutional: She is oriented to person, place, and time and well-developed, well-nourished, and in no distress.  HENT:  Head: Normocephalic and atraumatic.  Eyes: Conjunctivae are normal.  Neck: Neck supple.  Cardiovascular: Normal rate, regular rhythm, normal heart sounds and intact distal pulses.  Pulmonary/Chest: Effort normal and breath sounds normal. No respiratory distress. She has no wheezes. She has no rales. She exhibits no tenderness.  Neurological: She is alert and oriented to person, place, and time.  Skin: Skin is warm and dry. No rash noted.  Psychiatric: Affect normal.   Assessment/Plan: No problem-specific assessment & plan notes found for this encounter.

## 2013-04-06 NOTE — Progress Notes (Signed)
Pre visit review using our clinic review tool, if applicable. No additional management support is needed unless otherwise documented below in the visit note. 

## 2013-04-06 NOTE — Patient Instructions (Signed)
Please take 1/2 tablet of citalopram (celexa) daily for 7 days.  Then increase to 1 tablet daily.  Please take other medications as prescribed.  Please return in 1 month for follow-up and medication management.

## 2013-04-06 NOTE — Assessment & Plan Note (Addendum)
Discussed therapy and long-term medication for anxiety.  Patient agrees the need for another medication.  Will start with Citalopram 5 mg QD x 7 days, Increasing to 10 mg QD x 7.  Follow-up in 1 month.

## 2013-05-06 ENCOUNTER — Other Ambulatory Visit: Payer: Self-pay | Admitting: Dermatology

## 2013-05-11 ENCOUNTER — Ambulatory Visit: Payer: BC Managed Care – PPO | Admitting: Physician Assistant

## 2013-05-19 ENCOUNTER — Encounter: Payer: Self-pay | Admitting: Family Medicine

## 2013-05-19 ENCOUNTER — Ambulatory Visit (INDEPENDENT_AMBULATORY_CARE_PROVIDER_SITE_OTHER): Payer: BC Managed Care – PPO | Admitting: Family Medicine

## 2013-05-19 VITALS — BP 122/78 | HR 74 | Temp 97.9°F | Ht 67.5 in | Wt 172.0 lb

## 2013-05-19 DIAGNOSIS — F411 Generalized anxiety disorder: Secondary | ICD-10-CM

## 2013-05-19 DIAGNOSIS — I10 Essential (primary) hypertension: Secondary | ICD-10-CM

## 2013-05-19 DIAGNOSIS — F419 Anxiety disorder, unspecified: Secondary | ICD-10-CM

## 2013-05-19 MED ORDER — CITALOPRAM HYDROBROMIDE 10 MG PO TABS: 10.0000 mg | ORAL_TABLET | Freq: Every day | ORAL | Status: DC

## 2013-05-19 MED ORDER — ALPRAZOLAM 0.25 MG PO TABS: 0.2500 mg | ORAL_TABLET | Freq: Every evening | ORAL | Status: DC | PRN

## 2013-05-19 NOTE — Progress Notes (Signed)
Pre visit review using our clinic review tool, if applicable. No additional management support is needed unless otherwise documented below in the visit note. 

## 2013-05-19 NOTE — Patient Instructions (Signed)

## 2013-05-24 NOTE — Assessment & Plan Note (Signed)
Well controlled, no changes 

## 2013-05-24 NOTE — Assessment & Plan Note (Signed)
celexa recently started tolerating well. May continue Alprazolam

## 2013-05-24 NOTE — Progress Notes (Signed)
Patient ID: Latoya [name], female   DOB: Aug 26, 1955, 57 y.o.   MRN: [id number] Latoya [name] [id number] 1955/10/24 05/24/2013      Progress Note-Follow Up  Subjective  Chief Complaint  Chief Complaint  Patient presents with  . Follow-up    1 month    HPI  Patient is a 57 year old female who is in today for followup. She's been under great deal of stress at work and had a panic attack with palpitations and shortness of breath. Came in and start Celexa and already feels it is helping some. Denies any GI or GU complaints. No further panic attacks. No chest pain, palpitations or shortness of breath  Past Medical History  Diagnosis Date  . Hyperlipidemia   . Hypertension   . Dermatitis 12/03/2007    Qualifier: Diagnosis of  By: Janit Bern    . Cervical cancer screening 12/30/2012  . Cancer 12/2009    BCC, SCC  . Eczema 01/04/2013  . Preventative health care 01/04/2013    Past Surgical History  Procedure Laterality Date  . Dilation and currettage    . Mohs surgery  12/2009    basal cell     Family History  Problem Relation Age of Onset  . Heart disease Mother   . Diabetes Mother   . Heart disease Father     pacer  . Diabetes Father   . Hypertension Father     weight controlled  . Obesity Father   . Melanoma    . Obesity Sister     s/p lap band  . Cancer Maternal Grandmother     pancreatic  . Cancer Paternal Grandmother 82    liver  . Psoriasis Brother   . Obesity Brother   . Obesity Brother   . Diabetes Brother     diet controlled    History   Social History  . Marital Status: Married    Spouse Name: N/A    Number of Children: N/A  . Years of Education: N/A   Occupational History  . 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 Smoking status: Former Games developer  . Smokeless tobacco: Never Used  . Alcohol Use: Yes  . Drug Use: No  . Sexual Activity: Not on file   Other Topics Concern  . Not on file   Social History Narrative  . No narrative on  file    Current Outpatient Prescriptions on File Prior to Visit  Medication Sig Dispense Refill  . aspirin 81 MG tablet Take 81 mg by mouth daily.        Marland Kitchen atorvastatin (LIPITOR) 40 MG tablet Take 1 tablet (40 mg total) by mouth daily.  30 tablet  0  . cetirizine (ZYRTEC) 5 MG tablet Take 5 mg by mouth daily.      Marland Kitchen losartan-hydrochlorothiazide (HYZAAR) 100-25 MG per tablet Take 1 tablet by mouth daily.  30 tablet  5  . phentermine 37.5 MG capsule Take 37.5 mg by mouth daily. Takes 1/2 tablet daily.       No current facility-administered medications on file prior to visit.    No Known Allergies  Review of Systems  Review of Systems  Constitutional: Negative for fever and malaise/fatigue.  HENT: Negative for congestion.   Eyes: Negative for discharge.  Respiratory: Negative for shortness of breath.   Cardiovascular: Negative for chest pain, palpitations and leg swelling.  Gastrointestinal: Negative for nausea, abdominal pain and diarrhea.  Genitourinary: Negative for dysuria.  Musculoskeletal:  Negative for falls.  Skin: Negative for rash.  Neurological: Negative for loss of consciousness and headaches.  Endo/Heme/Allergies: Negative for polydipsia.  Psychiatric/Behavioral: Negative for depression and suicidal ideas. The patient is nervous/anxious. The patient does not have insomnia.     Objective  BP 122/78  Pulse 74  Temp(Src) 97.9 F (36.6 C) (Oral)  Ht 5' 7.5" (1.715 m)  Wt 172 lb (78.019 kg)  BMI 26.53 kg/m2  SpO2 96%  Physical Exam  Physical Exam  Constitutional: She is oriented to person, place, and time and well-developed, well-nourished, and in no distress. No distress.  HENT:  Head: Normocephalic and atraumatic.  Eyes: Conjunctivae are normal.  Neck: Neck supple. No thyromegaly present.  Cardiovascular: Normal rate, regular rhythm and normal heart sounds.   No murmur heard. Pulmonary/Chest: Effort normal and breath sounds normal. She has no wheezes.   Abdominal: She exhibits no distension and no mass.  Musculoskeletal: She exhibits no edema.  Lymphadenopathy:    She has no cervical adenopathy.  Neurological: She is alert and oriented to person, place, and time.  Skin: Skin is warm and dry. No rash noted. She is not diaphoretic.  Psychiatric: Memory, affect and judgment normal.    Lab Results  Component Value Date   TSH 1.293 12/30/2012   Lab Results  Component Value Date   WBC 5.4 12/30/2012   HGB 14.4 12/30/2012   HCT 42.4 12/30/2012   MCV 97.5 12/30/2012   PLT 256 12/30/2012   Lab Results  Component Value Date   CREATININE 0.92 12/30/2012   BUN 19 12/30/2012   NA 139 12/30/2012   K 4.7 12/30/2012   CL 100 12/30/2012   CO2 31 12/30/2012   Lab Results  Component Value Date   ALT 30 12/30/2012   AST 21 12/30/2012   ALKPHOS 43 12/30/2012   BILITOT 0.6 12/30/2012   Lab Results  Component Value Date   CHOL 185 12/30/2012   Lab Results  Component Value Date   HDL 79 12/30/2012   Lab Results  Component Value Date   LDLCALC 94 12/30/2012   Lab Results  Component Value Date   TRIG 60 12/30/2012   Lab Results  Component Value Date   CHOLHDL 2.3 12/30/2012     Assessment & Plan  HYPERTENSION Well controlled, no changes.  Anxiety celexa recently started tolerating well. May continue Alprazolam

## 2013-06-02 ENCOUNTER — Encounter: Payer: Self-pay | Admitting: *Deleted

## 2013-06-03 ENCOUNTER — Ambulatory Visit (INDEPENDENT_AMBULATORY_CARE_PROVIDER_SITE_OTHER): Payer: Self-pay | Admitting: *Deleted

## 2013-06-03 DIAGNOSIS — I781 Nevus, non-neoplastic: Secondary | ICD-10-CM

## 2013-06-03 DIAGNOSIS — I83893 Varicose veins of bilateral lower extremities with other complications: Secondary | ICD-10-CM

## 2013-06-03 NOTE — Progress Notes (Signed)
X=.3% Sotradecol administered with a 27g butterfly.  Patient received a total of 6cc.  Main area of concern on L chin (reflux present in GSV to left of that but small diameter and competant above that all the way to SFJ.) Injected the reticulars there and then other spiders scattered here and there on both legs.  Photos: yes  Compression stockings applied: yes and an ace over the L chin.

## 2013-06-04 ENCOUNTER — Encounter: Payer: Self-pay | Admitting: *Deleted

## 2013-06-08 ENCOUNTER — Other Ambulatory Visit: Payer: Self-pay | Admitting: *Deleted

## 2013-06-08 DIAGNOSIS — E785 Hyperlipidemia, unspecified: Secondary | ICD-10-CM

## 2013-06-08 MED ORDER — ATORVASTATIN CALCIUM 40 MG PO TABS: 40.0000 mg | ORAL_TABLET | Freq: Every day | ORAL | Status: DC

## 2013-06-08 NOTE — Progress Notes (Signed)
Patient presented to pharmacy for Rx refill; done/SLS

## 2013-06-15 ENCOUNTER — Encounter (INDEPENDENT_AMBULATORY_CARE_PROVIDER_SITE_OTHER): Payer: Self-pay

## 2013-06-15 DIAGNOSIS — I83893 Varicose veins of bilateral lower extremities with other complications: Secondary | ICD-10-CM

## 2013-07-07 ENCOUNTER — Other Ambulatory Visit: Payer: Self-pay | Admitting: Physician Assistant

## 2013-07-21 ENCOUNTER — Ambulatory Visit: Payer: BC Managed Care – PPO | Admitting: Family Medicine

## 2013-08-06 ENCOUNTER — Other Ambulatory Visit: Payer: Self-pay | Admitting: Family Medicine

## 2013-08-25 ENCOUNTER — Other Ambulatory Visit: Payer: Self-pay | Admitting: Family Medicine

## 2013-08-25 NOTE — Telephone Encounter (Signed)
RX faxed

## 2013-08-25 NOTE — Telephone Encounter (Signed)
Please advise refill? Im not sure who wrote the last RX or the date? Last OV with Dr Charlett Blake was 05-19-13

## 2013-08-27 ENCOUNTER — Telehealth: Payer: Self-pay | Admitting: Family Medicine

## 2013-08-27 NOTE — Telephone Encounter (Signed)
OK to call in the Alprazolam 0.25 mg tabs same sig number #20

## 2013-08-27 NOTE — Telephone Encounter (Signed)
Called in medication to [facility]. Called pt to let her know.

## 2013-08-27 NOTE — Telephone Encounter (Signed)
Last office visit 05/19/13. Last refill 05/19/13.

## 2013-08-27 NOTE — Telephone Encounter (Signed)
Refill alprazolam 

## 2013-09-14 ENCOUNTER — Ambulatory Visit: Payer: BC Managed Care – PPO | Admitting: Family Medicine

## 2013-10-01 ENCOUNTER — Encounter: Payer: Self-pay | Admitting: Family Medicine

## 2013-10-01 ENCOUNTER — Telehealth: Payer: Self-pay | Admitting: Family Medicine

## 2013-10-01 ENCOUNTER — Ambulatory Visit (INDEPENDENT_AMBULATORY_CARE_PROVIDER_SITE_OTHER): Payer: BC Managed Care – PPO | Admitting: Physician Assistant

## 2013-10-01 VITALS — BP 122/82 | HR 84 | Temp 98.0°F | Ht 67.5 in | Wt 175.1 lb

## 2013-10-01 DIAGNOSIS — I10 Essential (primary) hypertension: Secondary | ICD-10-CM

## 2013-10-01 DIAGNOSIS — F411 Generalized anxiety disorder: Secondary | ICD-10-CM

## 2013-10-01 DIAGNOSIS — E785 Hyperlipidemia, unspecified: Secondary | ICD-10-CM

## 2013-10-01 DIAGNOSIS — Z0000 Encounter for general adult medical examination without abnormal findings: Secondary | ICD-10-CM

## 2013-10-01 DIAGNOSIS — F419 Anxiety disorder, unspecified: Secondary | ICD-10-CM

## 2013-10-01 NOTE — Telephone Encounter (Signed)
Relevant patient education assigned to patient using Emmi. ° °

## 2013-10-01 NOTE — Assessment & Plan Note (Signed)
Well controlled. Asymptomatic °Continue current regimen °

## 2013-10-01 NOTE — Progress Notes (Signed)
Pre visit review using our clinic review tool, if applicable. No additional management support is needed unless otherwise documented below in the visit note. 

## 2013-10-01 NOTE — Telephone Encounter (Signed)
[facility] labs  ° °Patient will be going to [place] [facility] °

## 2013-10-01 NOTE — Progress Notes (Signed)
Patient presents to clinic today for 86-month follow up of medical issues.     Anxiety -- well-controlled with current regimen.  Very infrequent use of Xanax.  Denies SI/HI  Hypertension -- BP normotensive in clinic.  Patient denies headache, chest pain, palpitations, lightheadedness.  Endorses taking medications as directed.   Past Medical History  Diagnosis Date  . Hyperlipidemia   . Hypertension   . Dermatitis 12/03/2007    Qualifier: Diagnosis of  By: Jerold Coombe    . Cervical cancer screening 12/30/2012  . Cancer 12/2009    BCC, SCC  . Eczema 01/04/2013  . Preventative health care 01/04/2013    Current Outpatient Prescriptions on File Prior to Visit  Medication Sig Dispense Refill  . ALPRAZolam (XANAX) 0.25 MG tablet Take 1-2 tablets (0.25-0.5 mg total) by mouth at bedtime as needed for sleep or anxiety.  20 tablet  0  . aspirin 81 MG tablet Take 81 mg by mouth daily.        Marland Kitchen atorvastatin (LIPITOR) 40 MG tablet TAKE 1 TABLET (40 MG TOTAL) BY MOUTH DAILY.  30 tablet  2  . cetirizine (ZYRTEC) 5 MG tablet Take 5 mg by mouth daily.      . citalopram (CELEXA) 10 MG tablet Take 1 tablet (10 mg total) by mouth daily. Take 1/2 tablet a day for 7 days, then increase to 1 tablet daily.  30 tablet  3  . losartan-hydrochlorothiazide (HYZAAR) 100-25 MG per tablet Take 1 tablet by mouth daily.  30 tablet  5  . phentermine (ADIPEX-P) 37.5 MG tablet TAKE 1 TABLET BY MOUTH EVERY MORNING  30 tablet  0   No current facility-administered medications on file prior to visit.    No Known Allergies  Family History  Problem Relation Age of Onset  . Heart disease Mother   . Diabetes Mother   . Heart disease Father     pacer  . Diabetes Father   . Hypertension Father     weight controlled  . Obesity Father   . Melanoma    . Obesity Sister     s/p lap band  . Cancer Maternal Grandmother     pancreatic  . Cancer Paternal Grandmother 20    liver  . Psoriasis Brother   . Obesity Brother   .  Obesity Brother   . Diabetes Brother     diet controlled    History   Social History  . Marital Status: Married    Spouse Name: N/A    Number of Children: N/A  . Years of Education: N/A   Occupational History  . 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 Smoking status: Former Research scientist (life sciences)  . Smokeless tobacco: Never Used  . Alcohol Use: Yes  . Drug Use: No  . Sexual Activity: None   Other Topics Concern  . None   Social History Narrative  . None   Review of Systems - See HPI.  All other ROS are negative.  BP 122/82  Pulse 84  Temp(Src) 98 F (36.7 C) (Oral)  Ht 5' 7.5" (1.715 m)  Wt 175 lb 1.9 oz (79.434 kg)  BMI 27.01 kg/m2  SpO2 95%  Physical Exam  Vitals reviewed. Constitutional: She is oriented to person, place, and time and well-developed, well-nourished, and in no distress.  HENT:  Head: Normocephalic and atraumatic.  Right Ear: External ear normal.  Left Ear: External ear normal.  Nose: Nose normal.  Mouth/Throat: Oropharynx is  clear and moist. No oropharyngeal exudate.  Eyes: Conjunctivae are normal.  Neck: Neck supple.  Cardiovascular: Normal rate and regular rhythm.   Pulmonary/Chest: Effort normal and breath sounds normal.  Neurological: She is alert and oriented to person, place, and time. No cranial nerve deficit.  Skin: Skin is warm and dry. No rash noted.  Psychiatric: Affect normal.   Assessment/Plan: HYPERTENSION Well-controlled.  Asymptomatic.  Continue current regimen.  Anxiety Well-controlled. Continue current regimen.  Follow-up with PCP in 6 months for annual examination.  Return sooner if needed.

## 2013-10-01 NOTE — Patient Instructions (Signed)
Please continue medications as directed.  Follow-up with Dr. Charlett Blake in 6 months for annual exam with labs.  Follow-up sooner if needed.

## 2013-10-01 NOTE — Assessment & Plan Note (Signed)
Well-controlled. Continue current regimen.  Follow-up with PCP in 6 months for annual examination.  Return sooner if needed.

## 2013-10-16 ENCOUNTER — Telehealth: Payer: Self-pay | Admitting: Family Medicine

## 2013-10-16 MED ORDER — PHENTERMINE HCL 37.5 MG PO TABS: ORAL_TABLET | ORAL | Status: DC

## 2013-10-16 NOTE — Telephone Encounter (Signed)
PHENTRIMINE 37.5 qTY 30 TAKE 1 EACH MORNING

## 2013-10-16 NOTE — Telephone Encounter (Signed)
Spoke with pt, advised message from Dr Charlett Blake. Pt understood.

## 2013-10-16 NOTE — Telephone Encounter (Signed)
She can have one more month of Phentermine 37.5 mg po daily, disp #30 but after that she will be a the 6 prescription mark and should consider other meds. Can come in to discuss

## 2013-10-20 ENCOUNTER — Telehealth: Payer: Self-pay | Admitting: Family Medicine

## 2013-10-20 MED ORDER — PHENTERMINE HCL 37.5 MG PO TABS: ORAL_TABLET | ORAL | Status: DC

## 2013-10-20 NOTE — Telephone Encounter (Signed)
RX reprinted. I don't see the original that was faxed?

## 2013-10-20 NOTE — Telephone Encounter (Signed)
Latoya [name] from the [facility] states that they never received the phentermine refill. Please resend

## 2013-11-10 ENCOUNTER — Other Ambulatory Visit: Payer: Self-pay | Admitting: Family Medicine

## 2013-11-13 ENCOUNTER — Other Ambulatory Visit: Payer: Self-pay | Admitting: Family Medicine

## 2013-11-16 NOTE — Telephone Encounter (Signed)
Refill sent per [facility] refill protocol/SLS  

## 2013-12-14 ENCOUNTER — Other Ambulatory Visit: Payer: Self-pay | Admitting: Family Medicine

## 2013-12-14 ENCOUNTER — Other Ambulatory Visit: Payer: Self-pay | Admitting: Physician Assistant

## 2014-01-14 ENCOUNTER — Other Ambulatory Visit: Payer: Self-pay | Admitting: Family Medicine

## 2014-01-14 NOTE — Telephone Encounter (Signed)
Rx sent to pharmacy. LDM 

## 2014-02-04 ENCOUNTER — Telehealth: Payer: Self-pay | Admitting: Family Medicine

## 2014-02-04 DIAGNOSIS — E785 Hyperlipidemia, unspecified: Secondary | ICD-10-CM

## 2014-02-04 DIAGNOSIS — I10 Essential (primary) hypertension: Secondary | ICD-10-CM

## 2014-02-04 DIAGNOSIS — Z0000 Encounter for general adult medical examination without abnormal findings: Secondary | ICD-10-CM

## 2014-02-04 NOTE — Telephone Encounter (Signed)
So she can have those things checked if she wants but I reviewed her chart and I do not see any obvious payable diagnoses so I cannot guarantee her insurance will pay for any of this. If she is OK with that we can order.

## 2014-02-04 NOTE — Telephone Encounter (Signed)
Caller name: Zariana Relation to pt: self Call back number: [phone] Pharmacy:  Reason for call:    Patient is requesting that A1c, hormone level, and vit b be checked as well. Patient coming in on 02/08/14 for labs.

## 2014-02-04 NOTE — Telephone Encounter (Signed)
Please advise? And if any other labs need to be done?

## 2014-02-04 NOTE — Telephone Encounter (Signed)
For her annual exam can order liver, renal, cbc, tsh, lipid for h/o HTN and hyperlipidemia

## 2014-02-05 NOTE — Telephone Encounter (Signed)
Left a message for patient to return our call. Pt needs to be aware of mds note below.

## 2014-02-08 ENCOUNTER — Other Ambulatory Visit (INDEPENDENT_AMBULATORY_CARE_PROVIDER_SITE_OTHER): Payer: BC Managed Care – PPO

## 2014-02-08 DIAGNOSIS — I10 Essential (primary) hypertension: Secondary | ICD-10-CM

## 2014-02-08 DIAGNOSIS — E785 Hyperlipidemia, unspecified: Secondary | ICD-10-CM

## 2014-02-08 DIAGNOSIS — Z0000 Encounter for general adult medical examination without abnormal findings: Secondary | ICD-10-CM

## 2014-02-08 LAB — HEPATIC FUNCTION PANEL
ALBUMIN: 4.5 g/dL (ref 3.5–5.2)
ALK PHOS: 54 U/L (ref 39–117)
ALT: 30 U/L (ref 0–35)
AST: 24 U/L (ref 0–37)
Bilirubin, Direct: 0 mg/dL (ref 0.0–0.3)
TOTAL PROTEIN: 7.8 g/dL (ref 6.0–8.3)
Total Bilirubin: 0.5 mg/dL (ref 0.2–1.2)

## 2014-02-08 LAB — CBC
HCT: 42.1 % (ref 36.0–46.0)
Hemoglobin: 14.2 g/dL (ref 12.0–15.0)
MCHC: 33.6 g/dL (ref 30.0–36.0)
MCV: 98.5 fl (ref 78.0–100.0)
Platelets: 220 10*3/uL (ref 150.0–400.0)
RBC: 4.27 Mil/uL (ref 3.87–5.11)
RDW: 15.3 % (ref 11.5–15.5)
WBC: 5.2 10*3/uL (ref 4.0–10.5)

## 2014-02-08 LAB — LIPID PANEL
CHOL/HDL RATIO: 2
Cholesterol: 204 mg/dL — ABNORMAL HIGH (ref 0–200)
HDL: 84.7 mg/dL (ref >39.00)
LDL Cholesterol: 106 mg/dL — ABNORMAL HIGH (ref 0–99)
NonHDL: 119.3
Triglycerides: 67 mg/dL (ref 0.0–149.0)
VLDL: 13.4 mg/dL (ref 0.0–40.0)

## 2014-02-08 LAB — RENAL FUNCTION PANEL
Albumin: 4.5 g/dL (ref 3.5–5.2)
BUN: 20 mg/dL (ref 6–23)
CO2: 30 mEq/L (ref 19–32)
Calcium: 9.8 mg/dL (ref 8.4–10.5)
Chloride: 98 mEq/L (ref 96–112)
Creatinine, Ser: 0.9 mg/dL (ref 0.4–1.2)
GFR: 71.82 mL/min (ref >60.00)
GLUCOSE: 88 mg/dL (ref 70–99)
Phosphorus: 4 mg/dL (ref 2.3–4.6)
Potassium: 4.8 mEq/L (ref 3.5–5.1)
SODIUM: 138 meq/L (ref 135–145)

## 2014-02-08 LAB — TSH: TSH: 0.64 u[IU]/mL (ref 0.35–4.50)

## 2014-02-11 ENCOUNTER — Encounter: Payer: Self-pay | Admitting: Family Medicine

## 2014-02-11 ENCOUNTER — Ambulatory Visit (INDEPENDENT_AMBULATORY_CARE_PROVIDER_SITE_OTHER): Payer: BC Managed Care – PPO | Admitting: Family Medicine

## 2014-02-11 VITALS — BP 95/56 | HR 88 | Temp 97.8°F | Ht 67.5 in | Wt 179.0 lb

## 2014-02-11 DIAGNOSIS — I10 Essential (primary) hypertension: Secondary | ICD-10-CM

## 2014-02-11 DIAGNOSIS — F419 Anxiety disorder, unspecified: Secondary | ICD-10-CM

## 2014-02-11 DIAGNOSIS — E669 Obesity, unspecified: Secondary | ICD-10-CM

## 2014-02-11 DIAGNOSIS — E663 Overweight: Secondary | ICD-10-CM

## 2014-02-11 DIAGNOSIS — T7840XD Allergy, unspecified, subsequent encounter: Secondary | ICD-10-CM

## 2014-02-11 DIAGNOSIS — E785 Hyperlipidemia, unspecified: Secondary | ICD-10-CM

## 2014-02-11 DIAGNOSIS — Z0000 Encounter for general adult medical examination without abnormal findings: Secondary | ICD-10-CM

## 2014-02-11 DIAGNOSIS — R232 Flushing: Secondary | ICD-10-CM

## 2014-02-11 DIAGNOSIS — E538 Deficiency of other specified B group vitamins: Secondary | ICD-10-CM

## 2014-02-11 DIAGNOSIS — N951 Menopausal and female climacteric states: Secondary | ICD-10-CM

## 2014-02-11 MED ORDER — ATORVASTATIN CALCIUM 40 MG PO TABS: 40.0000 mg | ORAL_TABLET | Freq: Every day | ORAL | Status: DC

## 2014-02-11 MED ORDER — LOSARTAN POTASSIUM 100 MG PO TABS: 100.0000 mg | ORAL_TABLET | Freq: Every day | ORAL | Status: DC

## 2014-02-11 MED ORDER — ALPRAZOLAM 0.25 MG PO TABS: 0.2500 mg | ORAL_TABLET | Freq: Every evening | ORAL | Status: DC | PRN

## 2014-02-11 MED ORDER — PHENTERMINE HCL 37.5 MG PO TABS: ORAL_TABLET | ORAL | Status: DC

## 2014-02-11 MED ORDER — LOSARTAN POTASSIUM-HCTZ 100-25 MG PO TABS: 1.0000 | ORAL_TABLET | Freq: Every day | ORAL | Status: DC

## 2014-02-11 NOTE — Patient Instructions (Signed)
Needs blood pressure check in roughly 1 month with nurse   Hypertension Hypertension, commonly called high blood pressure, is when the force of blood pumping through your arteries is too strong. Your arteries are the blood vessels that carry blood from your heart throughout your body. A blood pressure reading consists of a higher number over a lower number, such as 110/72. The higher number (systolic) is the pressure inside your arteries when your heart pumps. The lower number (diastolic) is the pressure inside your arteries when your heart relaxes. Ideally you want your blood pressure below 120/80. Hypertension forces your heart to work harder to pump blood. Your arteries may become narrow or stiff. Having hypertension puts you at risk for heart disease, stroke, and other problems.  RISK FACTORS Some risk factors for high blood pressure are controllable. Others are not.  Risk factors you cannot control include:   Race. You may be at higher risk if you are African American.  Age. Risk increases with age.  Gender. Men are at higher risk than women before age 47 years. After age 32, women are at higher risk than men. Risk factors you can control include:  Not getting enough exercise or physical activity.  Being overweight.  Getting too much fat, sugar, calories, or salt in your diet.  Drinking too much alcohol. SIGNS AND SYMPTOMS Hypertension does not usually cause signs or symptoms. Extremely high blood pressure (hypertensive crisis) may cause headache, anxiety, shortness of breath, and nosebleed. DIAGNOSIS  To check if you have hypertension, your health care provider will measure your blood pressure while you are seated, with your arm held at the level of your heart. It should be measured at least twice using the same arm. Certain conditions can cause a difference in blood pressure between your right and left arms. A blood pressure reading that is higher than normal on one occasion does  not mean that you need treatment. If one blood pressure reading is high, ask your health care provider about having it checked again. TREATMENT  Treating high blood pressure includes making lifestyle changes and possibly taking medicine. Living a healthy lifestyle can help lower high blood pressure. You may need to change some of your habits. Lifestyle changes may include:  Following the DASH diet. This diet is high in fruits, vegetables, and whole grains. It is low in salt, red meat, and added sugars.  Getting at least 2 hours of brisk physical activity every week.  Losing weight if necessary.  Not smoking.  Limiting alcoholic beverages.  Learning ways to reduce stress. If lifestyle changes are not enough to get your blood pressure under control, your health care provider may prescribe medicine. You may need to take more than one. Work closely with your health care provider to understand the risks and benefits. HOME CARE INSTRUCTIONS  Have your blood pressure rechecked as directed by your health care provider.   Take medicines only as directed by your health care provider. Follow the directions carefully. Blood pressure medicines must be taken as prescribed. The medicine does not work as well when you skip doses. Skipping doses also puts you at risk for problems.   Do not smoke.   Monitor your blood pressure at home as directed by your health care provider. SEEK MEDICAL CARE IF:   You think you are having a reaction to medicines taken.  You have recurrent headaches or feel dizzy.  You have swelling in your ankles.  You have trouble with your vision. SEEK  IMMEDIATE MEDICAL CARE IF:  You develop a severe headache or confusion.  You have unusual weakness, numbness, or feel faint.  You have severe chest or abdominal pain.  You vomit repeatedly.  You have trouble breathing. MAKE SURE YOU:   Understand these instructions.  Will watch your condition.  Will get help  right away if you are not doing well or get worse. Document Released: 05/14/2005 Document Revised: 09/28/2013 Document Reviewed: 03/06/2013 [facility] Patient Information 2015 [place], [place]. This information is not intended to replace advice given to you by your health care provider. Make sure you discuss any questions you have with your health care provider.

## 2014-02-11 NOTE — Progress Notes (Signed)
Pre visit review using our clinic review tool, if applicable. No additional management support is needed unless otherwise documented below in the visit note. 

## 2014-02-12 ENCOUNTER — Other Ambulatory Visit: Payer: Self-pay | Admitting: Family Medicine

## 2014-02-12 NOTE — Telephone Encounter (Signed)
Refill sent.

## 2014-02-12 NOTE — Telephone Encounter (Signed)
Caller name: Keysi  Relation to pt: self  Call back number: [phone] Pharmacy: [facility], [place] [phone]   Reason for call: pt requesting a refill citalopram (CELEXA) 10 MG tablet

## 2014-02-14 DIAGNOSIS — E538 Deficiency of other specified B group vitamins: Secondary | ICD-10-CM | POA: Insufficient documentation

## 2014-02-14 DIAGNOSIS — R232 Flushing: Secondary | ICD-10-CM | POA: Insufficient documentation

## 2014-02-14 NOTE — Progress Notes (Signed)
Patient ID: Latoya [name], female   DOB: 1956-02-05, 58 y.o.   MRN: [id number] Latoya [name] [id number] 01/06/1956 02/14/2014      Progress Note-Follow Up  Subjective  Chief Complaint  Chief Complaint  Patient presents with  . Annual Exam    physical    HPI  Patient is a 58 year old female in today for routine medical care. She is in today for routine annual care. Feels well. No recent illness. Has been using very low dose of phentermine half tab intermittently with good results. No recent illness. Denies CP/palp/SOB/HA/congestion/fevers/GI or GU c/o. Taking meds as prescribed  Past Medical History  Diagnosis Date  . Hyperlipidemia   . Hypertension   . Dermatitis 12/03/2007    Qualifier: Diagnosis of  By: Jerold Coombe    . Cervical cancer screening 12/30/2012  . Cancer 12/2009    BCC, SCC  . Eczema 01/04/2013  . Preventative health care 01/04/2013    Past Surgical History  Procedure Laterality Date  . Dilation and currettage    . Mohs surgery  12/2009    basal cell     Family History  Problem Relation Age of Onset  . Heart disease Mother   . Diabetes Mother   . Heart disease Father     pacer  . Diabetes Father   . Hypertension Father     weight controlled  . Obesity Father   . Melanoma    . Obesity Sister     s/p lap band  . Cancer Maternal Grandmother     pancreatic  . Cancer Paternal Grandmother 39    liver  . Psoriasis Brother   . Obesity Brother   . Obesity Brother   . Diabetes Brother     diet controlled    History   Social History  . Marital Status: Married    Spouse Name: N/A    Number of Children: N/A  . Years of Education: N/A   Occupational History  . 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 Smoking status: Former Research scientist (life sciences)  . Smokeless tobacco: Never Used  . Alcohol Use: Yes  . Drug Use: No  . Sexual Activity: Not on file     Comment: lives with. no dietary restrictions, works full time   Other Topics Concern  . Not on file    Social History Narrative  . No narrative on file    Current Outpatient Prescriptions on File Prior to Visit  Medication Sig Dispense Refill  . aspirin 81 MG tablet Take 81 mg by mouth daily.        . cetirizine (ZYRTEC) 5 MG tablet Take 5 mg by mouth daily.       No current facility-administered medications on file prior to visit.    No Known Allergies  Review of Systems  Review of Systems  Constitutional: Negative for fever, chills and malaise/fatigue.  HENT: Negative for congestion, hearing loss and nosebleeds.   Eyes: Negative for discharge.  Respiratory: Negative for cough, sputum production, shortness of breath and wheezing.   Cardiovascular: Negative for chest pain, palpitations and leg swelling.  Gastrointestinal: Negative for heartburn, nausea, vomiting, abdominal pain, diarrhea, constipation and blood in stool.  Genitourinary: Negative for dysuria, urgency, frequency and hematuria.  Musculoskeletal: Negative for back pain, falls and myalgias.  Skin: Negative for rash.  Neurological: Negative for dizziness, tremors, sensory change, focal weakness, loss of consciousness, weakness and headaches.  Endo/Heme/Allergies: Negative for polydipsia. Does not bruise/bleed  easily.  Psychiatric/Behavioral: Negative for depression and suicidal ideas. The patient is not nervous/anxious and does not have insomnia.     Objective  BP 95/56  Pulse 88  Temp(Src) 97.8 F (36.6 C) (Oral)  Ht 5' 7.5" (1.715 m)  Wt 179 lb (81.194 kg)  BMI 27.61 kg/m2  SpO2 98%  Physical Exam  Physical Exam  Constitutional: She is oriented to person, place, and time and well-developed, well-nourished, and in no distress. No distress.  HENT:  Head: Normocephalic and atraumatic.  Right Ear: External ear normal.  Left Ear: External ear normal.  Nose: Nose normal.  Mouth/Throat: Oropharynx is clear and moist. No oropharyngeal exudate.  Eyes: Conjunctivae are normal. Pupils are equal, round, and  reactive to light. Right eye exhibits no discharge. Left eye exhibits no discharge. No scleral icterus.  Neck: Normal range of motion. Neck supple. No thyromegaly present.  Cardiovascular: Normal rate, regular rhythm, normal heart sounds and intact distal pulses.   No murmur heard. Pulmonary/Chest: Effort normal and breath sounds normal. No respiratory distress. She has no wheezes. She has no rales.  Abdominal: Soft. Bowel sounds are normal. She exhibits no distension and no mass. There is no tenderness.  Musculoskeletal: Normal range of motion. She exhibits no edema and no tenderness.  Lymphadenopathy:    She has no cervical adenopathy.  Neurological: She is alert and oriented to person, place, and time. She has normal reflexes. No cranial nerve deficit. Coordination normal.  Skin: Skin is warm and dry. No rash noted. She is not diaphoretic.  Psychiatric: Mood, memory and affect normal.    Lab Results  Component Value Date   TSH 0.64 02/08/2014   Lab Results  Component Value Date   WBC 5.2 02/08/2014   HGB 14.2 02/08/2014   HCT 42.1 02/08/2014   MCV 98.5 02/08/2014   PLT 220.0 02/08/2014   Lab Results  Component Value Date   CREATININE 0.9 02/08/2014   BUN 20 02/08/2014   NA 138 02/08/2014   K 4.8 02/08/2014   CL 98 02/08/2014   CO2 30 02/08/2014   Lab Results  Component Value Date   ALT 30 02/08/2014   AST 24 02/08/2014   ALKPHOS 54 02/08/2014   BILITOT 0.5 02/08/2014   Lab Results  Component Value Date   CHOL 204* 02/08/2014   Lab Results  Component Value Date   HDL 84.70 02/08/2014   Lab Results  Component Value Date   LDLCALC 106* 02/08/2014   Lab Results  Component Value Date   TRIG 67.0 02/08/2014   Lab Results  Component Value Date   CHOLHDL 2 02/08/2014     Assessment & Plan  HYPERTENSION Low today will drop HCTZ and monitor  Obesity Encouraged DASH diet, decrease po intake and increase exercise as tolerated. Needs 7-8 hours of sleep nightly. Avoid trans  fats, eat small, frequent meals every 4-5 hours with lean proteins, complex carbs and healthy fats. Minimize simple carbs, GMO foods.  Hot flashes Consider SNRI, avoid simple carbs, hot liquids, increase sleep and exercise. Consider Icool  Preventative health care Patient encouraged to maintain heart healthy diet, regular exercise, adequate sleep. Consider daily probiotics. Take medications as prescribed. Declines flu shot  HYPERLIPIDEMIA Mild, Encouraged heart healthy diet, increase exercise, avoid trans fats, consider a krill oil cap daily

## 2014-02-14 NOTE — Assessment & Plan Note (Signed)
Mild, Encouraged heart healthy diet, increase exercise, avoid trans fats, consider a krill oil cap daily 

## 2014-02-14 NOTE — Assessment & Plan Note (Signed)
Low today will drop HCTZ and monitor

## 2014-02-14 NOTE — Assessment & Plan Note (Signed)
Consider SNRI, avoid simple carbs, hot liquids, increase sleep and exercise. Consider Icool

## 2014-02-14 NOTE — Assessment & Plan Note (Signed)
Patient encouraged to maintain heart healthy diet, regular exercise, adequate sleep. Consider daily probiotics. Take medications as prescribed. Declines flu shot 

## 2014-02-14 NOTE — Assessment & Plan Note (Signed)
Encouraged DASH diet, decrease po intake and increase exercise as tolerated. Needs 7-8 hours of sleep nightly. Avoid trans fats, eat small, frequent meals every 4-5 hours with lean proteins, complex carbs and healthy fats. Minimize simple carbs, GMO foods. 

## 2014-02-17 ENCOUNTER — Telehealth: Payer: Self-pay

## 2014-02-17 ENCOUNTER — Other Ambulatory Visit: Payer: Self-pay | Admitting: Family Medicine

## 2014-02-17 ENCOUNTER — Telehealth: Payer: Self-pay | Admitting: Lab

## 2014-02-17 DIAGNOSIS — F419 Anxiety disorder, unspecified: Principal | ICD-10-CM

## 2014-02-17 DIAGNOSIS — F329 Major depressive disorder, single episode, unspecified: Secondary | ICD-10-CM

## 2014-02-17 MED ORDER — CITALOPRAM HYDROBROMIDE 20 MG PO TABS: 20.0000 mg | ORAL_TABLET | Freq: Every day | ORAL | Status: DC

## 2014-02-17 NOTE — Telephone Encounter (Signed)
Her labs were done, they look good, cholesterol is up slightly but not hi enough to warrant medication changes. Just avoid simple carbs and trans fats. She can increase her Celexa to 20 mg daily (can take 2 of the 10s til gone) I will send in #30 with 2 rf to last pharmacy in computer and then she should come in for follow up to dicuss changes in roughly 3 months.

## 2014-02-17 NOTE — Telephone Encounter (Signed)
Spoke to Latoya [name] to verify when had labs done. Latoya [name] stated she came in on the 9-14th for labs in the am. Then saw Doctor Charlett Blake on the 17th for a office visit.  The Latoya [name] did not come in on the 9-17th for any blood work that day.

## 2014-02-17 NOTE — Telephone Encounter (Signed)
Patient presents to the office with concerns over the dosing of her celexa. She currently takes 10mg  per day who states that she had discussed with Dr Charlett Blake increasing to 20mg  q day.  In reviewing the chart it appears that there were orders for the patient for a lab draw. The patient stated that she had her blood drawn but the chart shows that it needs to be drawn. The lab techs state that they do not draw a patient's blood until they release the orders and the labels print. Would you like increase the Celexa? Draw labs again? Please advise.

## 2014-02-18 NOTE — Telephone Encounter (Signed)
LM for patient to return the call.  

## 2014-02-19 NOTE — Telephone Encounter (Signed)
LM for patient to CB °

## 2014-02-22 NOTE — Telephone Encounter (Signed)
Pt aware.

## 2014-04-28 ENCOUNTER — Telehealth: Payer: Self-pay | Admitting: *Deleted

## 2014-04-28 ENCOUNTER — Other Ambulatory Visit: Payer: Self-pay | Admitting: Family Medicine

## 2014-04-28 NOTE — Telephone Encounter (Signed)
rx refill- phentermine 37.5mg  Last OV- 02/11/14 Last refilled- 02/11/14 #30 / 3 rf  UDS- none

## 2014-04-28 NOTE — Telephone Encounter (Signed)
She should not need a refill until January.

## 2014-04-29 NOTE — Telephone Encounter (Signed)
Pt notified / verbalized understanding. Pt states pharmacy must have requested new refill for this month , she did not.

## 2014-05-25 ENCOUNTER — Other Ambulatory Visit: Payer: Self-pay | Admitting: Family Medicine

## 2014-05-25 DIAGNOSIS — I10 Essential (primary) hypertension: Secondary | ICD-10-CM

## 2014-05-25 NOTE — Telephone Encounter (Signed)
Caller name: [facility]  Relation to pt: other  Call back number: [phone] Pharmacy: [facility], [place], [place] [address]   Reason for call:  Pharmacy states pt is on vacation and requesting a refill losartan (COZAAR) 100 MG tablet.

## 2014-05-26 MED ORDER — LOSARTAN POTASSIUM 100 MG PO TABS: 100.0000 mg | ORAL_TABLET | Freq: Every day | ORAL | Status: DC

## 2014-05-26 NOTE — Telephone Encounter (Signed)
Med filled for 1 month supply.

## 2014-05-26 NOTE — Telephone Encounter (Signed)
Patient calling back regarding this. She has been out of med for two days now and needs refill sent.

## 2014-06-11 ENCOUNTER — Other Ambulatory Visit: Payer: Self-pay | Admitting: Family Medicine

## 2014-06-11 NOTE — Telephone Encounter (Signed)
Last filled:  02/11/14 Amt:  30, 3 tablets Last OV:  02/11/14  Please advise.

## 2014-06-14 NOTE — Telephone Encounter (Signed)
Rx printed and signed and faxed to the pharmacy.//AB/CMA

## 2014-06-29 ENCOUNTER — Encounter: Payer: Self-pay | Admitting: *Deleted

## 2014-06-30 ENCOUNTER — Ambulatory Visit: Payer: Self-pay | Admitting: *Deleted

## 2014-07-09 ENCOUNTER — Other Ambulatory Visit: Payer: Self-pay | Admitting: Family Medicine

## 2014-07-09 DIAGNOSIS — F329 Major depressive disorder, single episode, unspecified: Secondary | ICD-10-CM

## 2014-07-09 DIAGNOSIS — I10 Essential (primary) hypertension: Secondary | ICD-10-CM

## 2014-07-09 DIAGNOSIS — F419 Anxiety disorder, unspecified: Secondary | ICD-10-CM

## 2014-07-09 MED ORDER — LOSARTAN POTASSIUM 100 MG PO TABS: 100.0000 mg | ORAL_TABLET | Freq: Every day | ORAL | Status: DC

## 2014-07-09 MED ORDER — CITALOPRAM HYDROBROMIDE 20 MG PO TABS: 20.0000 mg | ORAL_TABLET | Freq: Every day | ORAL | Status: DC

## 2014-08-03 ENCOUNTER — Encounter: Payer: Self-pay | Admitting: *Deleted

## 2014-08-04 ENCOUNTER — Ambulatory Visit (INDEPENDENT_AMBULATORY_CARE_PROVIDER_SITE_OTHER): Payer: 59 | Admitting: *Deleted

## 2014-08-04 DIAGNOSIS — I83899 Varicose veins of unspecified lower extremities with other complications: Secondary | ICD-10-CM

## 2014-08-04 DIAGNOSIS — I83892 Varicose veins of left lower extremities with other complications: Secondary | ICD-10-CM

## 2014-08-04 NOTE — Progress Notes (Signed)
The patient returned for more sclerotherapy (last treated 06/03/13) on her left lower leg where she reports that new veins have developed. Upon examination, she has new reticulars on the front and bulging vv"s in the back calf. She says the left leg aches and is warmer to the touch than the right, which I can confirm as true. Swelling is present in the left ankle and her symptoms worsen as the day goes on. Dr. Bridgett Larsson consulted. The sonosite demonstrates reflux in the L GSV above the knee and looks to be enlarged. He asked me to order a reflux study of her L LE and to schedule a visit with Dr. Kellie Simmering.  The patient has been wearing her thigh high compression stockings (20-30 mm Hg) since her last sclero treatment (05/2013). She bought them for [name] when she was initially seen in 2014. She does try elevation and Ibuprofen but still experiences the pain, swelling and bulging. Appt made for 08/10/14.

## 2014-08-09 ENCOUNTER — Encounter: Payer: Self-pay | Admitting: Vascular Surgery

## 2014-08-10 ENCOUNTER — Encounter (HOSPITAL_COMMUNITY): Payer: 59

## 2014-08-10 ENCOUNTER — Encounter: Payer: 59 | Admitting: Vascular Surgery

## 2014-08-11 ENCOUNTER — Other Ambulatory Visit: Payer: Self-pay | Admitting: *Deleted

## 2014-08-11 DIAGNOSIS — I83892 Varicose veins of left lower extremities with other complications: Secondary | ICD-10-CM

## 2014-08-12 ENCOUNTER — Ambulatory Visit (INDEPENDENT_AMBULATORY_CARE_PROVIDER_SITE_OTHER): Payer: No Typology Code available for payment source | Admitting: Family Medicine

## 2014-08-12 ENCOUNTER — Encounter: Payer: Self-pay | Admitting: Family Medicine

## 2014-08-12 VITALS — BP 110/74 | HR 94 | Temp 98.3°F | Ht 67.5 in | Wt 182.0 lb

## 2014-08-12 DIAGNOSIS — I10 Essential (primary) hypertension: Secondary | ICD-10-CM | POA: Diagnosis not present

## 2014-08-12 DIAGNOSIS — F329 Major depressive disorder, single episode, unspecified: Secondary | ICD-10-CM

## 2014-08-12 DIAGNOSIS — F32A Depression, unspecified: Secondary | ICD-10-CM

## 2014-08-12 DIAGNOSIS — F418 Other specified anxiety disorders: Secondary | ICD-10-CM | POA: Diagnosis not present

## 2014-08-12 DIAGNOSIS — M79675 Pain in left toe(s): Secondary | ICD-10-CM

## 2014-08-12 DIAGNOSIS — F419 Anxiety disorder, unspecified: Secondary | ICD-10-CM | POA: Diagnosis not present

## 2014-08-12 DIAGNOSIS — E782 Mixed hyperlipidemia: Secondary | ICD-10-CM | POA: Diagnosis not present

## 2014-08-12 DIAGNOSIS — I83892 Varicose veins of left lower extremities with other complications: Secondary | ICD-10-CM

## 2014-08-12 DIAGNOSIS — E663 Overweight: Secondary | ICD-10-CM

## 2014-08-12 MED ORDER — PHENTERMINE HCL 30 MG PO TBDP: 15.0000 mg | ORAL_TABLET | Freq: Every day | ORAL | Status: DC

## 2014-08-12 MED ORDER — CITALOPRAM HYDROBROMIDE 20 MG PO TABS: 20.0000 mg | ORAL_TABLET | Freq: Every day | ORAL | Status: DC

## 2014-08-12 MED ORDER — PHENTERMINE HCL 15 MG PO CAPS: 15.0000 mg | ORAL_CAPSULE | ORAL | Status: DC

## 2014-08-12 MED ORDER — ALPRAZOLAM 0.25 MG PO TABS: 0.2500 mg | ORAL_TABLET | Freq: Every evening | ORAL | Status: DC | PRN

## 2014-08-12 NOTE — Progress Notes (Signed)
Pre visit review using our clinic review tool, if applicable. No additional management support is needed unless otherwise documented below in the visit note. 

## 2014-08-12 NOTE — Patient Instructions (Addendum)
Ice and Salon Pas gel twice daily to great toe Hypertension Hypertension, commonly called high blood pressure, is when the force of blood pumping through your arteries is too strong. Your arteries are the blood vessels that carry blood from your heart throughout your body. A blood pressure reading consists of a higher number over a lower number, such as 110/72. The higher number (systolic) is the pressure inside your arteries when your heart pumps. The lower number (diastolic) is the pressure inside your arteries when your heart relaxes. Ideally you want your blood pressure below 120/80. Hypertension forces your heart to work harder to pump blood. Your arteries may become narrow or stiff. Having hypertension puts you at risk for heart disease, stroke, and other problems.  RISK FACTORS Some risk factors for high blood pressure are controllable. Others are not.  Risk factors you cannot control include:   Race. You may be at higher risk if you are African American.  Age. Risk increases with age.  Gender. Men are at higher risk than women before age 84 years. After age 33, women are at higher risk than men. Risk factors you can control include:  Not getting enough exercise or physical activity.  Being overweight.  Getting too much fat, sugar, calories, or salt in your diet.  Drinking too much alcohol. SIGNS AND SYMPTOMS Hypertension does not usually cause signs or symptoms. Extremely high blood pressure (hypertensive crisis) may cause headache, anxiety, shortness of breath, and nosebleed. DIAGNOSIS  To check if you have hypertension, your health care provider will measure your blood pressure while you are seated, with your arm held at the level of your heart. It should be measured at least twice using the same arm. Certain conditions can cause a difference in blood pressure between your right and left arms. A blood pressure reading that is higher than normal on one occasion does not mean that you  need treatment. If one blood pressure reading is high, ask your health care provider about having it checked again. TREATMENT  Treating high blood pressure includes making lifestyle changes and possibly taking medicine. Living a healthy lifestyle can help lower high blood pressure. You may need to change some of your habits. Lifestyle changes may include:  Following the DASH diet. This diet is high in fruits, vegetables, and whole grains. It is low in salt, red meat, and added sugars.  Getting at least 2 hours of brisk physical activity every week.  Losing weight if necessary.  Not smoking.  Limiting alcoholic beverages.  Learning ways to reduce stress. If lifestyle changes are not enough to get your blood pressure under control, your health care provider may prescribe medicine. You may need to take more than one. Work closely with your health care provider to understand the risks and benefits. HOME CARE INSTRUCTIONS  Have your blood pressure rechecked as directed by your health care provider.   Take medicines only as directed by your health care provider. Follow the directions carefully. Blood pressure medicines must be taken as prescribed. The medicine does not work as well when you skip doses. Skipping doses also puts you at risk for problems.   Do not smoke.   Monitor your blood pressure at home as directed by your health care provider. SEEK MEDICAL CARE IF:   You think you are having a reaction to medicines taken.  You have recurrent headaches or feel dizzy.  You have swelling in your ankles.  You have trouble with your vision. [place]  CARE IF:  You develop a severe headache or confusion.  You have unusual weakness, numbness, or feel faint.  You have severe chest or abdominal pain.  You vomit repeatedly.  You have trouble breathing. MAKE SURE YOU:   Understand these instructions.  Will watch your condition.  Will get help right away if you  are not doing well or get worse. Document Released: 05/14/2005 Document Revised: 09/28/2013 Document Reviewed: 03/06/2013 [facility] Patient Information 2015 [place], [place]. This information is not intended to replace advice given to you by your health care provider. Make sure you discuss any questions you have with your health care provider.

## 2014-08-16 ENCOUNTER — Encounter: Payer: Self-pay | Admitting: Vascular Surgery

## 2014-08-17 ENCOUNTER — Ambulatory Visit (INDEPENDENT_AMBULATORY_CARE_PROVIDER_SITE_OTHER): Payer: No Typology Code available for payment source | Admitting: Vascular Surgery

## 2014-08-17 ENCOUNTER — Ambulatory Visit (HOSPITAL_COMMUNITY)
Admission: RE | Admit: 2014-08-17 | Discharge: 2014-08-17 | Disposition: A | Discharge status: Home / Self Care | Payer: No Typology Code available for payment source | Source: Ambulatory Visit | Attending: Vascular Surgery | Admitting: Vascular Surgery

## 2014-08-17 VITALS — BP 128/83 | HR 83 | Resp 16 | Ht 68.0 in | Wt 175.0 lb

## 2014-08-17 DIAGNOSIS — I781 Nevus, non-neoplastic: Secondary | ICD-10-CM | POA: Insufficient documentation

## 2014-08-17 DIAGNOSIS — I83899 Varicose veins of unspecified lower extremities with other complications: Secondary | ICD-10-CM | POA: Insufficient documentation

## 2014-08-17 DIAGNOSIS — I83892 Varicose veins of left lower extremities with other complications: Secondary | ICD-10-CM

## 2014-08-17 NOTE — Progress Notes (Signed)
Subjective:     Patient ID: Latoya [name], female   DOB: [birth year], 59 y.o.   MRN: [id number]  HPI this 59 year old female returns for further evaluation of recurrent spider veins in the left medial calf area. She was evaluated in 2015 by Dr. Juanda Crumble fields and found to have a small great saphenous vein on the left with very little reflux. She was treated with sclerotherapy by Kathlee Nations in January 2015. Since then she has noted some new spider veins over the past 14 months. She denies a describes aching throbbing discomfort in this area. She does not were elastic compression stockings. She has no history of DVT thrombophlebitis stasis ulcers or bleeding.  Past Medical History  Diagnosis Date  . Hyperlipidemia   . Hypertension   . Dermatitis 12/03/2007    Qualifier: Diagnosis of  By: Jerold Coombe    . Cervical cancer screening 12/30/2012  . Cancer 12/2009    BCC, SCC  . Eczema 01/04/2013  . Preventative health care 01/04/2013    History  Substance Use Topics  . Smoking status: Former Research scientist (life sciences)  . Smokeless tobacco: Never Used  . Alcohol Use: Yes    Family History  Problem Relation Age of Onset  . Heart disease Mother   . Diabetes Mother   . Heart disease Father     pacer  . Diabetes Father   . Hypertension Father     weight controlled  . Obesity Father   . Melanoma    . Obesity Sister     s/p lap band  . Cancer Maternal Grandmother     pancreatic  . Cancer Paternal Grandmother 51    liver  . Psoriasis Brother   . Obesity Brother   . Obesity Brother   . Diabetes Brother     diet controlled    No Known Allergies   Current outpatient prescriptions:  .  ALPRAZolam (XANAX) 0.25 MG tablet, Take 1-2 tablets (0.25-0.5 mg total) by mouth at bedtime as needed for sleep or anxiety., Disp: 20 tablet, Rfl: 2 .  aspirin 81 MG tablet, Take 81 mg by mouth daily.  , Disp: , Rfl:  .  atorvastatin (LIPITOR) 40 MG tablet, Take 1 tablet (40 mg total) by mouth daily at 6 PM., Disp: 90 tablet, Rfl:  3 .  cetirizine (ZYRTEC) 5 MG tablet, Take 5 mg by mouth daily., Disp: , Rfl:  .  citalopram (CELEXA) 20 MG tablet, Take 1 tablet (20 mg total) by mouth daily., Disp: 30 tablet, Rfl: 6 .  losartan (COZAAR) 100 MG tablet, Take 1 tablet (100 mg total) by mouth daily., Disp: 30 tablet, Rfl: 7 .  phentermine 15 MG capsule, Take 1 capsule (15 mg total) by mouth every morning., Disp: 30 capsule, Rfl: 1  Filed Vitals:   08/17/14 0931  BP: 128/83  Pulse: 83  Resp: 16  Height: 5\' 8"  (1.727 m)  Weight: 175 lb (79.379 kg)    Body mass index is 26.61 kg/(m^2).           Review of Systems denies chest pain, dyspnea on exertion   PND, orthopnea. Objective:   Physical Exam BP 128/83 mmHg  Pulse 83  Resp 16  Ht 5\' 8"  (1.727 m)  Wt 175 lb (79.379 kg)  BMI 26.61 kg/m2  Gen. well-developed well-nourished female no apparent stress alert and oriented 3 Lungs no rhonchi or wheezing Cardiovascular regular rhythm no murmurs Left leg with 3+ femoral popliteal and posterior tibial pulse palpable. Left foot well-perfused.  Scattered spider veins present in the left medial calf area. No hyperpigmentation or ulceration or chronic edema noted.  Today I ordered a venous duplex exam the left leg which I reviewed and interpreted. The vein is of normal size with no reflux demonstrated there is no DVT or deep venous reflux.     Assessment:     Spider veins left leg with no evidence of superficial venous reflux or deep venous reflux    Plan:     Have recommended sclerotherapy if patient would desire treatment and she is considering this

## 2014-08-18 ENCOUNTER — Ambulatory Visit: Payer: Self-pay

## 2014-08-19 ENCOUNTER — Ambulatory Visit (INDEPENDENT_AMBULATORY_CARE_PROVIDER_SITE_OTHER): Payer: 59 | Admitting: *Deleted

## 2014-08-19 DIAGNOSIS — I8393 Asymptomatic varicose veins of bilateral lower extremities: Secondary | ICD-10-CM

## 2014-08-19 NOTE — Progress Notes (Signed)
X=.3% Sotradecol administered with a 27g butterfly.  Patient received a total of 22cc.  Retreated this nice lady. She has a lot of spiders and retics. Some were easily accessed. Tol well. Will follow prn.  Photos: No.  Compression stockings applied: Yes.

## 2014-08-20 ENCOUNTER — Ambulatory Visit: Payer: 59 | Admitting: *Deleted

## 2014-08-23 ENCOUNTER — Encounter: Payer: Self-pay | Admitting: Family Medicine

## 2014-08-23 DIAGNOSIS — M79675 Pain in left toe(s): Secondary | ICD-10-CM

## 2014-08-23 HISTORY — DX: Pain in left toe(s): M79.675

## 2014-08-23 NOTE — Assessment & Plan Note (Signed)
Stubbed her toe last week, pain improving. Encouraged ice and topical treatments

## 2014-08-23 NOTE — Assessment & Plan Note (Signed)
Following with vascular surgery

## 2014-08-23 NOTE — Addendum Note (Signed)
Addended by: Penni Homans A on: 08/23/2014 10:34 AM   Modules accepted: Level of Service

## 2014-08-23 NOTE — Assessment & Plan Note (Addendum)
Encouraged DASH diet, decrease po intake and increase exercise as tolerated. Needs 7-8 hours of sleep nightly. Avoid trans fats, eat small, frequent meals every 4-5 hours with lean proteins, complex carbs and healthy fats. Minimize simple carbs, GMO foods. Has had good weight loss. Patient encouraged to discontinue Phentermine. Will drop dose to 15 mg and then discontinue

## 2014-08-23 NOTE — Assessment & Plan Note (Signed)
Well controlled, no changes to meds. Encouraged heart healthy diet such as the DASH diet and exercise as tolerated.  °

## 2014-08-23 NOTE — Assessment & Plan Note (Signed)
Tolerating statin, encouraged heart healthy diet, avoid trans fats, minimize simple carbs and saturated fats. Increase exercise as tolerated 

## 2014-08-23 NOTE — Progress Notes (Signed)
Latoya [name]  [id number] 09-Sep-1955 08/23/2014      Progress Note-Follow Up  Subjective  Chief Complaint  Chief Complaint  Patient presents with  . Follow-up    HPI  Patient is a 59 y.o. female in today for routine medical care. Patient in today doing fairly well. Is seeing vascular surgery for her varicose veins in left leg, not red or hot. She injured her third left toe last week but the pain is nearly resolved. No other acute complaints. Denies CP/palp/SOB/HA/congestion/fevers/GI or GU c/o. Taking meds as prescribed  Past Medical History  Diagnosis Date  . Hyperlipidemia   . Hypertension   . Dermatitis 12/03/2007    Qualifier: Diagnosis of  By: Jerold Coombe    . Cervical cancer screening 12/30/2012  . Cancer 12/2009    BCC, SCC  . Eczema 01/04/2013  . Preventative health care 01/04/2013  . Morbid obesity 01/13/2012  . Overweight 01/13/2012  . Pain of toe of left foot 08/23/2014    Past Surgical History  Procedure Laterality Date  . Dilation and currettage    . Mohs surgery  12/2009    basal cell     Family History  Problem Relation Age of Onset  . Heart disease Mother   . Diabetes Mother   . Heart disease Father     pacer  . Diabetes Father   . Hypertension Father     weight controlled  . Obesity Father   . Melanoma    . Obesity Sister     s/p lap band  . Cancer Maternal Grandmother     pancreatic  . Cancer Paternal Grandmother 78    liver  . Psoriasis Brother   . Obesity Brother   . Obesity Brother   . Diabetes Brother     diet controlled    History   Social History  . Marital Status: Married    Spouse Name: N/A  . Number of Children: N/A  . Years of Education: N/A   Occupational History  . 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 Smoking status: Former Research scientist (life sciences)  . Smokeless tobacco: Never Used  . Alcohol Use: Yes  . Drug Use: No  . Sexual Activity: Not on file     Comment: lives with. no dietary restrictions, works full  time   Other Topics Concern  . Not on file   Social History Narrative    Current Outpatient Prescriptions on File Prior to Visit  Medication Sig Dispense Refill  . aspirin 81 MG tablet Take 81 mg by mouth daily.      Marland Kitchen atorvastatin (LIPITOR) 40 MG tablet Take 1 tablet (40 mg total) by mouth daily at 6 PM. 90 tablet 3  . cetirizine (ZYRTEC) 5 MG tablet Take 5 mg by mouth daily.    Marland Kitchen losartan (COZAAR) 100 MG tablet Take 1 tablet (100 mg total) by mouth daily. 30 tablet 7   No current facility-administered medications on file prior to visit.    No Known Allergies  Review of Systems  Review of Systems  Constitutional: Negative for fever and malaise/fatigue.  HENT: Negative for congestion.   Eyes: Negative for discharge.  Respiratory: Negative for shortness of breath.   Cardiovascular: Negative for chest pain, palpitations and leg swelling.  Gastrointestinal: Negative for nausea, abdominal pain and diarrhea.  Genitourinary: Negative for dysuria.  Musculoskeletal: Positive for joint pain. Negative for falls.  Skin: Negative for rash.  Neurological: Negative for loss of  consciousness and headaches.  Endo/Heme/Allergies: Negative for polydipsia.  Psychiatric/Behavioral: Negative for depression and suicidal ideas. The patient is not nervous/anxious and does not have insomnia.     Objective  BP 110/74 mmHg  Pulse 94  Temp(Src) 98.3 F (36.8 C) (Oral)  Ht 5' 7.5" (1.715 m)  Wt 182 lb (82.555 kg)  BMI 28.07 kg/m2  SpO2 95%  Physical Exam  Physical Exam  Constitutional: She is oriented to person, place, and time and well-developed, well-nourished, and in no distress. No distress.  HENT:  Head: Normocephalic and atraumatic.  Eyes: Conjunctivae are normal.  Neck: Neck supple. No thyromegaly present.  Cardiovascular: Normal rate, regular rhythm and normal heart sounds.   No murmur heard. Pulmonary/Chest: Effort normal and breath sounds normal. She has no wheezes.    Abdominal: She exhibits no distension and no mass.  Musculoskeletal: She exhibits no edema.  Lymphadenopathy:    She has no cervical adenopathy.  Neurological: She is alert and oriented to person, place, and time.  Skin: Skin is warm and dry. No rash noted. She is not diaphoretic.  Psychiatric: Memory, affect and judgment normal.    Lab Results  Component Value Date   TSH 0.64 02/08/2014   Lab Results  Component Value Date   WBC 5.2 02/08/2014   HGB 14.2 02/08/2014   HCT 42.1 02/08/2014   MCV 98.5 02/08/2014   PLT 220.0 02/08/2014   Lab Results  Component Value Date   CREATININE 0.9 02/08/2014   BUN 20 02/08/2014   NA 138 02/08/2014   K 4.8 02/08/2014   CL 98 02/08/2014   CO2 30 02/08/2014   Lab Results  Component Value Date   ALT 30 02/08/2014   AST 24 02/08/2014   ALKPHOS 54 02/08/2014   BILITOT 0.5 02/08/2014   Lab Results  Component Value Date   CHOL 204* 02/08/2014   Lab Results  Component Value Date   HDL 84.70 02/08/2014   Lab Results  Component Value Date   LDLCALC 106* 02/08/2014   Lab Results  Component Value Date   TRIG 67.0 02/08/2014   Lab Results  Component Value Date   CHOLHDL 2 02/08/2014     Assessment & Plan  Essential hypertension Well controlled, no changes to meds. Encouraged heart healthy diet such as the DASH diet and exercise as tolerated.    Hyperlipidemia, mixed Tolerating statin, encouraged heart healthy diet, avoid trans fats, minimize simple carbs and saturated fats. Increase exercise as tolerated   Overweight Encouraged DASH diet, decrease po intake and increase exercise as tolerated. Needs 7-8 hours of sleep nightly. Avoid trans fats, eat small, frequent meals every 4-5 hours with lean proteins, complex carbs and healthy fats. Minimize simple carbs, GMO foods. Has had good weight loss. Patient encouraged to discontinue Phentermine. Will drop dose to 15 mg and then discontinue   Varicose veins of lower  extremities with complications Following with vascular surgery.   Pain of toe of left foot Stubbed her toe last week, pain improving. Encouraged ice and topical treatments

## 2014-08-24 ENCOUNTER — Encounter: Payer: Self-pay | Admitting: *Deleted

## 2014-10-14 ENCOUNTER — Other Ambulatory Visit: Payer: Self-pay | Admitting: Family Medicine

## 2014-10-14 ENCOUNTER — Telehealth: Payer: Self-pay | Admitting: Family Medicine

## 2014-10-14 MED ORDER — PHENTERMINE HCL 15 MG PO CAPS: 15.0000 mg | ORAL_CAPSULE | ORAL | Status: DC

## 2014-10-14 NOTE — Telephone Encounter (Signed)
Called the patient left a detailed message hardcopy is ready for pickup at the front desk.

## 2014-10-14 NOTE — Telephone Encounter (Signed)
Last refill was on 08/12/14 #30 with 1 refill Last office visit 08/12/14 Next scheduled office visit 08/12/14 Advise on Phentermine refill

## 2014-10-14 NOTE — Telephone Encounter (Signed)
Pt never picked up RX for phentermine 37.5 from 10/16/13

## 2014-12-13 ENCOUNTER — Other Ambulatory Visit: Payer: Self-pay | Admitting: Family Medicine

## 2014-12-13 MED ORDER — PHENTERMINE HCL 15 MG PO CAPS: 15.0000 mg | ORAL_CAPSULE | ORAL | Status: DC

## 2014-12-13 NOTE — Telephone Encounter (Signed)
Called the patient (left detailed message) informed to pickup hardcopy for Phentermine at the front desk.

## 2015-01-10 ENCOUNTER — Encounter (HOSPITAL_BASED_OUTPATIENT_CLINIC_OR_DEPARTMENT_OTHER): Payer: Self-pay | Admitting: *Deleted

## 2015-01-10 ENCOUNTER — Emergency Department (HOSPITAL_BASED_OUTPATIENT_CLINIC_OR_DEPARTMENT_OTHER)
Admission: EM | Admit: 2015-01-10 | Discharge: 2015-01-10 | Disposition: A | Discharge status: Home / Self Care | Payer: No Typology Code available for payment source | Attending: Emergency Medicine | Admitting: Emergency Medicine

## 2015-01-10 ENCOUNTER — Emergency Department (HOSPITAL_BASED_OUTPATIENT_CLINIC_OR_DEPARTMENT_OTHER): Payer: No Typology Code available for payment source

## 2015-01-10 DIAGNOSIS — Y9389 Activity, other specified: Secondary | ICD-10-CM | POA: Diagnosis not present

## 2015-01-10 DIAGNOSIS — Y998 Other external cause status: Secondary | ICD-10-CM | POA: Insufficient documentation

## 2015-01-10 DIAGNOSIS — Y9289 Other specified places as the place of occurrence of the external cause: Secondary | ICD-10-CM | POA: Insufficient documentation

## 2015-01-10 DIAGNOSIS — S52125A Nondisplaced fracture of head of left radius, initial encounter for closed fracture: Secondary | ICD-10-CM | POA: Diagnosis not present

## 2015-01-10 DIAGNOSIS — Z23 Encounter for immunization: Secondary | ICD-10-CM | POA: Insufficient documentation

## 2015-01-10 DIAGNOSIS — Z79899 Other long term (current) drug therapy: Secondary | ICD-10-CM | POA: Diagnosis not present

## 2015-01-10 DIAGNOSIS — E785 Hyperlipidemia, unspecified: Secondary | ICD-10-CM | POA: Insufficient documentation

## 2015-01-10 DIAGNOSIS — S52122A Displaced fracture of head of left radius, initial encounter for closed fracture: Secondary | ICD-10-CM

## 2015-01-10 DIAGNOSIS — E663 Overweight: Secondary | ICD-10-CM | POA: Diagnosis not present

## 2015-01-10 DIAGNOSIS — Z872 Personal history of diseases of the skin and subcutaneous tissue: Secondary | ICD-10-CM | POA: Diagnosis not present

## 2015-01-10 DIAGNOSIS — Z85828 Personal history of other malignant neoplasm of skin: Secondary | ICD-10-CM | POA: Insufficient documentation

## 2015-01-10 DIAGNOSIS — S80212A Abrasion, left knee, initial encounter: Secondary | ICD-10-CM | POA: Diagnosis not present

## 2015-01-10 DIAGNOSIS — Z7982 Long term (current) use of aspirin: Secondary | ICD-10-CM | POA: Insufficient documentation

## 2015-01-10 DIAGNOSIS — W010XXA Fall on same level from slipping, tripping and stumbling without subsequent striking against object, initial encounter: Secondary | ICD-10-CM | POA: Insufficient documentation

## 2015-01-10 DIAGNOSIS — I10 Essential (primary) hypertension: Secondary | ICD-10-CM | POA: Diagnosis not present

## 2015-01-10 DIAGNOSIS — S59902A Unspecified injury of left elbow, initial encounter: Secondary | ICD-10-CM | POA: Diagnosis present

## 2015-01-10 MED ORDER — OXYCODONE-ACETAMINOPHEN 5-325 MG PO TABS: 1.0000 | ORAL_TABLET | ORAL | Status: DC | PRN

## 2015-01-10 MED ORDER — IBUPROFEN 200 MG PO TABS
600.0000 mg | ORAL_TABLET | Freq: Once | ORAL | Status: AC
  Administered 2015-01-10: 600 mg via ORAL
  Filled 2015-01-10 (×2): qty 1

## 2015-01-10 MED ORDER — TETANUS-DIPHTH-ACELL PERTUSSIS 5-2.5-18.5 LF-MCG/0.5 IM SUSP
0.5000 mL | Freq: Once | INTRAMUSCULAR | Status: AC
  Administered 2015-01-10: 0.5 mL via INTRAMUSCULAR
  Filled 2015-01-10: qty 0.5

## 2015-01-10 NOTE — ED Notes (Signed)
Edema noted on the inside of the L elbow with some mild discoloration.  Pt. Also has an abrasion on the L knee with full ROM in the L leg and L knee.

## 2015-01-10 NOTE — ED Notes (Signed)
Fall. She tripped over a gas hose. Injury to her left forearm and left knee.

## 2015-01-10 NOTE — ED Provider Notes (Signed)
CSN: [id number]     Arrival date & time 01/10/15  1821 History  This chart was scribed for Latoya Freiberg, MD by Meriel Pica, ED Scribe. This patient was seen in room MH03/MH03 and the patient's care was started 6:41 PM.   Chief Complaint  Patient presents with  . Fall   Patient is a 59 y.o. female presenting with fall. The history is provided by the patient. No language interpreter was used.  Fall This is a new problem. The current episode started 1 to 2 hours ago. The problem occurs constantly. The problem has not changed since onset.Exacerbated by: movement. Nothing relieves the symptoms. She has tried nothing for the symptoms. The treatment provided no relief.   HPI Comments: Latoya [name] is a 59 y.o. female, with no significant PMhx, who presents to the Emergency Department complaining of sudden onset, constant, moderate pain in left elbow and left anterior knee s/p fall that occurred PTA. She has associated abrasions to her left elbow and left knee. Pt reports when she was pumping gas she tripped over the hose and landed on the pavement, catching most of her weight on her left arm. Her pain in both left elbow and left knee is exacerbated with movement of the extremities. Denies LOC, syncope, any other arthralgias or myalgias, or pertinent PMhx.   Past Medical History  Diagnosis Date  . Hyperlipidemia   . Hypertension   . Dermatitis 12/03/2007    Qualifier: Diagnosis of  By: Jerold Coombe    . Cervical cancer screening 12/30/2012  . Cancer 12/2009    BCC, SCC  . Eczema 01/04/2013  . Preventative health care 01/04/2013  . Morbid obesity 01/13/2012  . Overweight 01/13/2012  . Pain of toe of left foot 08/23/2014   Past Surgical History  Procedure Laterality Date  . Dilation and currettage    . Mohs surgery  12/2009    basal cell    Family History  Problem Relation Age of Onset  . Heart disease Mother   . Diabetes Mother   . Heart disease Father     pacer  . Diabetes Father   .  Hypertension Father     weight controlled  . Obesity Father   . Melanoma    . Obesity Sister     s/p lap band  . Cancer Maternal Grandmother     pancreatic  . Cancer Paternal Grandmother 89    liver  . Psoriasis Brother   . Obesity Brother   . Obesity Brother   . Diabetes Brother     diet controlled   Social History  Substance Use Topics  . Smoking status: Former Research scientist (life sciences)  . Smokeless tobacco: Never Used  . Alcohol Use: Yes   OB History    No data available     Review of Systems  Musculoskeletal: Positive for arthralgias. Negative for myalgias.  Skin: Positive for wound ( abrasions to left elbow and left knee).  Neurological: Negative for syncope.  All other systems reviewed and are negative.  Allergies  Review of patient's allergies indicates no known allergies.  Home Medications   Prior to Admission medications   Medication Sig Start Date End Date Taking? Authorizing Provider  ALPRAZolam (XANAX) 0.25 MG tablet Take 1-2 tablets (0.25-0.5 mg total) by mouth at bedtime as needed for sleep or anxiety. 08/12/14   Mosie Lukes, MD  aspirin 81 MG tablet Take 81 mg by mouth daily.      Historical Provider, MD  atorvastatin (  LIPITOR) 40 MG tablet Take 1 tablet (40 mg total) by mouth daily at 6 PM. 02/11/14   Mosie Lukes, MD  cetirizine (ZYRTEC) 5 MG tablet Take 5 mg by mouth daily.    Historical Provider, MD  citalopram (CELEXA) 20 MG tablet Take 1 tablet (20 mg total) by mouth daily. 08/12/14   Mosie Lukes, MD  losartan (COZAAR) 100 MG tablet Take 1 tablet (100 mg total) by mouth daily. 07/09/14   Mosie Lukes, MD  oxyCODONE-acetaminophen (PERCOCET/ROXICET) 5-325 MG per tablet Take 1-2 tablets by mouth every 4 (four) hours as needed. 01/10/15   Latoya Freiberg, MD  phentermine 15 MG capsule Take 1 capsule (15 mg total) by mouth every morning. 12/13/14   Mosie Lukes, MD   BP 139/92 mmHg  Pulse 78  Temp(Src) 98.2 F (36.8 C) (Oral)  Resp 16  Ht 5\' 8"  (1.727 m)  Wt  175 lb (79.379 kg)  BMI 26.61 kg/m2  SpO2 97% Physical Exam  Constitutional: She is oriented to person, place, and time. She appears well-developed and well-nourished.  HENT:  Head: Normocephalic and atraumatic.  Right Ear: External ear normal.  Left Ear: External ear normal.  Eyes: Conjunctivae and EOM are normal. Pupils are equal, round, and reactive to light.  Neck: Normal range of motion. Neck supple.  Cardiovascular: Normal rate, regular rhythm, normal heart sounds and intact distal pulses.   Pulmonary/Chest: Effort normal and breath sounds normal.  Abdominal: Soft. Bowel sounds are normal. There is no tenderness.  Musculoskeletal:       Left elbow: She exhibits decreased range of motion (2/2 pain) and swelling. She exhibits no laceration.  Neurological: She is alert and oriented to person, place, and time.  Skin: Skin is warm and dry.  Vitals reviewed.   ED Course  Procedures  DIAGNOSTIC STUDIES: Oxygen Saturation is 97% on RA, normal by my interpretation.    COORDINATION OF CARE: 6:45 PM Discussed treatment plan which includes to order Xray of left elbow with pt. Pt acknowledges and agrees to plan.   Labs Review Labs Reviewed - No data to display  Imaging Review Dg Elbow Complete Left  01/10/2015   CLINICAL DATA:  Tripped over hose at a gas station today and injured left elbow. Anterior and posterior pain with limited range of motion.  EXAM: LEFT ELBOW - COMPLETE 3+ VIEW  COMPARISON:  None.  FINDINGS: Examination demonstrates a displaced anterior fat pad and small posterior fat pad. There is subtle focal cortical regularity over the base of the radial head suggesting a nondisplaced fracture.  IMPRESSION: Findings suggesting a nondisplaced fracture at the base of the radial head.   Electronically Signed   By: Marin Olp M.D.   On: 01/10/2015 19:13      EKG Interpretation None      MDM   Final diagnoses:  Radial head fracture, left, closed, initial encounter     59 y.o. female with pertinent PMH of HTN presents with L elbow pain after mechanical fall.  Exam as above.  Elba Barman revealed likely L radial head fx.  DC home in stable condition to fu with orthopedics after splint.    I have reviewed all laboratory and imaging studies if ordered as above  1. Radial head fracture, left, closed, initial encounter           Latoya Freiberg, MD 01/10/15 [phone]

## 2015-01-10 NOTE — Discharge Instructions (Signed)
Radial Head Fracture A radial head fracture is a break of the smaller bone (radius) in the forearm. The head of this bone is the part near the elbow. These fractures commonly happen during a fall, when you land on an outstretched arm. These fractures are more common in middle aged adults and are common with a dislocation of the elbow. SYMPTOMS   Swelling of the elbow joint and pain on the outside of the elbow.  Pain and difficulty in bending or straightening the elbow.  Pain and difficulty in turning the palm of the hand up or down with the elbow bent. DIAGNOSIS  Your caregiver may make this diagnosis by a physical exam. X-rays can confirm the type and amount of fracture. Sometimes a fracture that is not displaced cannot be seen on the original X-ray. TREATMENT  Radial head fractures are classified according to the amount of movement (displacement) of parts from the normal position.  Type 1 Fractures  Type 1 fractures are generally small fractures in which bone pieces remain together (nondisplaced fracture).  The fracture may not be seen on initial X-rays. Usually if X-rays are repeated two to three weeks later, the fracture will show up. A splint or sling is used for a few days. Gentle early motion is used to prevent the elbow from becoming stiff. It should not be done vigorously or forced as this could displace the bone pieces. Type 2 Fractures  With type 2 fractures, bone pieces are slightly displaced and larger pieces of bone are broken off.  If only a little displacement of the bone piece is present, splinting for 4 to 5 days usually works well. This is again followed with gentle active range of motion. Small fragments may be surgically removed.  Large pieces of bone that can be put back into place will sometimes be fixed with pins or screws to hold them until the bone is healed. If this cannot be done, the fragments are removed. For older, less active people, sometimes the entire radial  head is removed if the wrist is not injured. The elbow and arm will still work fine. Soft tissue, tendon, and ligament injuries are corrected at the same time. Type 3 Fractures  Type 3 fractures have multiple broken pieces of bone that cannot be fixed. Surgery is usually needed to remove the broken bits of bone and what is left of the radial head. Soft-tissue damage is repaired. Gentle early motion is used to prevent the elbow from becoming stiff. Sometimes an artificial radial head can be used to prevent deformity if the elbow is unstable. Rest, ice, elevation, immobilization, medications, and pain control are used in the early care. HOME CARE INSTRUCTIONS   Keep the injured part elevated while sitting or lying down. Keep the injury above the level of your heart (the center of the chest). This will decrease swelling and pain.  Apply ice to the injury for 15-20 minutes, 03-04 times per day while awake, for 2 days. Put the ice in a plastic bag and place a towel between the bag of ice and your cast or splint.  Move your fingers to avoid stiffness and minimize swelling.  If you have a plaster or fiberglass cast:  Do not try to scratch the skin under the cast using sharp or pointed objects.  Check the skin around the cast every day. You may put lotion on any red or sore areas.  Keep your cast dry and clean.  If you have a plaster splint:  Wear the splint as directed.  You may loosen the elastic around the splint if your fingers become numb, tingle, or turn cold or blue.  Do not put pressure on any part of your cast or splint. It may break. Rest your cast only on a pillow for the first 24 hours until it is fully hardened.  Your cast or splint can be protected during bathing with a plastic bag. Do not lower the cast or splint into the water.  Only take over-the-counter or prescription medicines for pain, discomfort, or fever as directed by your caregiver.  Follow all instructions for  follow-up with your caregiver. This includes any orthopedic referrals, physical therapy, and rehabilitation. Any delay in obtaining necessary care could result in a delay or failure of the bones to heal or permanent elbow stiffness.  Do not overdo exercises. This could further damage your injury. SEEK IMMEDIATE MEDICAL CARE IF:   Your cast or splint gets damaged or breaks.  You have more severe pain or swelling than you did before getting the cast.  You have severe pain when stretching your fingers.  There is a bad smell, new stains, and/or pus-like (purulent) drainage coming from under the cast.  Your fingers or hand turn pale or blue, become cold, or you lose feeling. Document Released: 03/05/2006 Document Revised: 09/28/2013 Document Reviewed: 04/12/2009 [facility] Patient Information 2015 [place], [place]. This information is not intended to replace advice given to you by your health care provider. Make sure you discuss any questions you have with your health care provider.

## 2015-01-11 ENCOUNTER — Other Ambulatory Visit: Payer: Self-pay | Admitting: Family Medicine

## 2015-01-11 ENCOUNTER — Telehealth: Payer: Self-pay | Admitting: Family Medicine

## 2015-01-11 DIAGNOSIS — S42309D Unspecified fracture of shaft of humerus, unspecified arm, subsequent encounter for fracture with routine healing: Secondary | ICD-10-CM

## 2015-01-11 NOTE — Telephone Encounter (Signed)
°  Relation to HO:OILN Call back [phone] Pharmacy:  Reason for call: pt was seen in the ER last night, pt has a radial head fracture left closed (elbow) pt states she need a referral to [facility] specialist with Dr. Tamera Punt, pt states she doesn't see a need to do a er follow up since she came [facility] the [facility] er and dr. Charlett Blake can review everything and see that she need a specialist.

## 2015-01-14 ENCOUNTER — Other Ambulatory Visit: Payer: Self-pay | Admitting: Physician Assistant

## 2015-01-14 ENCOUNTER — Telehealth: Payer: Self-pay | Admitting: Family Medicine

## 2015-01-14 MED ORDER — OXYCODONE-ACETAMINOPHEN 5-325 MG PO TABS: 1.0000 | ORAL_TABLET | ORAL | Status: DC | PRN

## 2015-01-14 NOTE — Telephone Encounter (Signed)
Called the patient had to leave a detailed message oxycodone has been filled and ready for pickup at the front desk.  Called the patient once again and she did answer, let her know the hardcopy for oxycodone has been filled and is at the front desk for pickup.

## 2015-01-14 NOTE — Telephone Encounter (Signed)
I have ok's a refill due to recent fracture and upcoming appointment. Rx ready for pick up at front desk. I hope she feels better soon.

## 2015-01-14 NOTE — Telephone Encounter (Signed)
Relation to pt: self  Call back number: [phone]   Reason for call:  Patient requesting a refill oxyCODONE-acetaminophen (PERCOCET/ROXICET) 5-325 MG per tablet patient states she has an orthopedic appointment 01/19/15, patient has only 2 pills left advised patient Dr. Charlett Blake is off and she said she can not make it thru the weekend with the pain she is experiencing

## 2015-02-11 ENCOUNTER — Other Ambulatory Visit: Payer: Self-pay

## 2015-02-11 DIAGNOSIS — E785 Hyperlipidemia, unspecified: Secondary | ICD-10-CM

## 2015-02-14 ENCOUNTER — Telehealth: Payer: Self-pay

## 2015-02-15 ENCOUNTER — Encounter: Payer: Self-pay | Admitting: Family Medicine

## 2015-02-15 ENCOUNTER — Ambulatory Visit (INDEPENDENT_AMBULATORY_CARE_PROVIDER_SITE_OTHER): Payer: No Typology Code available for payment source | Admitting: Family Medicine

## 2015-02-15 VITALS — BP 146/84 | HR 79 | Temp 98.4°F | Ht 68.0 in | Wt 189.1 lb

## 2015-02-15 DIAGNOSIS — I10 Essential (primary) hypertension: Secondary | ICD-10-CM | POA: Diagnosis not present

## 2015-02-15 DIAGNOSIS — E663 Overweight: Secondary | ICD-10-CM

## 2015-02-15 DIAGNOSIS — E785 Hyperlipidemia, unspecified: Secondary | ICD-10-CM | POA: Diagnosis not present

## 2015-02-15 DIAGNOSIS — E782 Mixed hyperlipidemia: Secondary | ICD-10-CM | POA: Diagnosis not present

## 2015-02-15 DIAGNOSIS — E538 Deficiency of other specified B group vitamins: Secondary | ICD-10-CM

## 2015-02-15 DIAGNOSIS — Z0000 Encounter for general adult medical examination without abnormal findings: Secondary | ICD-10-CM

## 2015-02-15 LAB — COMPREHENSIVE METABOLIC PANEL
ALK PHOS: 62 U/L (ref 39–117)
ALT: 20 U/L (ref 0–35)
AST: 21 U/L (ref 0–37)
Albumin: 4.4 g/dL (ref 3.5–5.2)
BILIRUBIN TOTAL: 0.5 mg/dL (ref 0.2–1.2)
BUN: 15 mg/dL (ref 6–23)
CALCIUM: 9.5 mg/dL (ref 8.4–10.5)
CO2: 29 mEq/L (ref 19–32)
CREATININE: 0.77 mg/dL (ref 0.40–1.20)
Chloride: 104 mEq/L (ref 96–112)
GFR: 81.31 mL/min (ref >60.00)
Glucose, Bld: 91 mg/dL (ref 70–99)
Potassium: 4.4 mEq/L (ref 3.5–5.1)
Sodium: 139 mEq/L (ref 135–145)
TOTAL PROTEIN: 7.4 g/dL (ref 6.0–8.3)

## 2015-02-15 LAB — LIPID PANEL
Cholesterol: 183 mg/dL (ref 0–200)
HDL: 77.7 mg/dL (ref >39.00)
LDL Cholesterol: 92 mg/dL (ref 0–99)
NONHDL: 104.87
TRIGLYCERIDES: 63 mg/dL (ref 0.0–149.0)
Total CHOL/HDL Ratio: 2
VLDL: 12.6 mg/dL (ref 0.0–40.0)

## 2015-02-15 LAB — CBC
HCT: 40.8 % (ref 36.0–46.0)
HEMOGLOBIN: 13.6 g/dL (ref 12.0–15.0)
MCHC: 33.3 g/dL (ref 30.0–36.0)
MCV: 98.1 fl (ref 78.0–100.0)
PLATELETS: 218 10*3/uL (ref 150.0–400.0)
RBC: 4.16 Mil/uL (ref 3.87–5.11)
RDW: 15.3 % (ref 11.5–15.5)
WBC: 5.6 10*3/uL (ref 4.0–10.5)

## 2015-02-15 LAB — TSH: TSH: 0.88 u[IU]/mL (ref 0.35–4.50)

## 2015-02-15 LAB — VITAMIN B12: Vitamin B-12: 281 pg/mL (ref 211–911)

## 2015-02-15 MED ORDER — PHENTERMINE HCL 15 MG PO CAPS: 15.0000 mg | ORAL_CAPSULE | ORAL | Status: DC

## 2015-02-15 MED ORDER — ATORVASTATIN CALCIUM 40 MG PO TABS: 40.0000 mg | ORAL_TABLET | Freq: Every day | ORAL | Status: DC

## 2015-02-15 NOTE — Patient Instructions (Addendum)
Confirm with insurance if Zostavax (Shingles vaccine) will be covered by insurance at the age of 60.   Krill or flaxseed oil twice a week for dry skin and scalp 64 fl oz of clear fluids to help with dry skin/scalp [email or web address] for shampoos Alba/Jason Try to take blood pressure once a month after waiting 10 minutes  Preventive Care for Adults A healthy lifestyle and preventive care can promote health and wellness. Preventive health guidelines for women include the following key practices.  A routine yearly physical is a good way to check with your health care provider about your health and preventive screening. It is a chance to share any concerns and updates on your health and to receive a thorough exam.  Visit your dentist for a routine exam and preventive care every 6 months. Brush your teeth twice a day and floss once a day. Good oral hygiene prevents tooth decay and gum disease.  The frequency of eye exams is based on your age, health, family medical history, use of contact lenses, and other factors. Follow your health care provider's recommendations for frequency of eye exams.  Eat a healthy diet. Foods like vegetables, fruits, whole grains, low-fat dairy products, and lean protein foods contain the nutrients you need without too many calories. Decrease your intake of foods high in solid fats, added sugars, and salt. Eat the right amount of calories for you.Get information about a proper diet from your health care provider, if necessary.  Regular physical exercise is one of the most important things you can do for your health. Most adults should get at least 150 minutes of moderate-intensity exercise (any activity that increases your heart rate and causes you to sweat) each week. In addition, most adults need muscle-strengthening exercises on 2 or more days a week.  Maintain a healthy weight. The body mass index (BMI) is a screening tool to identify possible weight problems. It provides  an estimate of body fat based on height and weight. Your health care provider can find your BMI and can help you achieve or maintain a healthy weight.For adults 20 years and older:  A BMI below 18.5 is considered underweight.  A BMI of 18.5 to 24.9 is normal.  A BMI of 25 to 29.9 is considered overweight.  A BMI of 30 and above is considered obese.  Maintain normal blood lipids and cholesterol levels by exercising and minimizing your intake of saturated fat. Eat a balanced diet with plenty of fruit and vegetables. Blood tests for lipids and cholesterol should begin at age 12 and be repeated every 5 years. If your lipid or cholesterol levels are high, you are over 50, or you are at high risk for heart disease, you may need your cholesterol levels checked more frequently.Ongoing high lipid and cholesterol levels should be treated with medicines if diet and exercise are not working.  If you smoke, find out from your health care provider how to quit. If you do not use tobacco, do not start.  Lung cancer screening is recommended for adults aged 28-80 years who are at high risk for developing lung cancer because of a history of smoking. A yearly low-dose CT scan of the lungs is recommended for people who have at least a 30-pack-year history of smoking and are a current smoker or have quit within the past 15 years. A pack year of smoking is smoking an average of 1 pack of cigarettes a day for 1 year (for example: 1 pack a day for  30 years or 2 packs a day for 15 years). Yearly screening should continue until the smoker has stopped smoking for at least 15 years. Yearly screening should be stopped for people who develop a health problem that would prevent them from having lung cancer treatment.  If you are pregnant, do not drink alcohol. If you are breastfeeding, be very cautious about drinking alcohol. If you are not pregnant and choose to drink alcohol, do not have more than 1 drink per day. One drink is  considered to be 12 ounces (355 mL) of beer, 5 ounces (148 mL) of wine, or 1.5 ounces (44 mL) of liquor.  Avoid use of street drugs. Do not share needles with anyone. Ask for help if you need support or instructions about stopping the use of drugs.  High blood pressure causes heart disease and increases the risk of stroke. Your blood pressure should be checked at least every 1 to 2 years. Ongoing high blood pressure should be treated with medicines if weight loss and exercise do not work.  If you are 38-10 years old, ask your health care provider if you should take aspirin to prevent strokes.  Diabetes screening involves taking a blood sample to check your fasting blood sugar level. This should be done once every 3 years, after age 87, if you are within normal weight and without risk factors for diabetes. Testing should be considered at a younger age or be carried out more frequently if you are overweight and have at least 1 risk factor for diabetes.  Breast cancer screening is essential preventive care for women. You should practice "breast self-awareness." This means understanding the normal appearance and feel of your breasts and may include breast self-examination. Any changes detected, no matter how small, should be reported to a health care provider. Women in their 70s and 30s should have a clinical breast exam (CBE) by a health care provider as part of a regular health exam every 1 to 3 years. After age 56, women should have a CBE every year. Starting at age 75, women should consider having a mammogram (breast X-ray test) every year. Women who have a family history of breast cancer should talk to their health care provider about genetic screening. Women at a high risk of breast cancer should talk to their health care providers about having an MRI and a mammogram every year.  Breast cancer gene (BRCA)-related cancer risk assessment is recommended for women who have family members with BRCA-related  cancers. BRCA-related cancers include breast, ovarian, tubal, and peritoneal cancers. Having family members with these cancers may be associated with an increased risk for harmful changes (mutations) in the breast cancer genes BRCA1 and BRCA2. Results of the assessment will determine the need for genetic counseling and BRCA1 and BRCA2 testing.  Routine pelvic exams to screen for cancer are no longer recommended for nonpregnant women who are considered low risk for cancer of the pelvic organs (ovaries, uterus, and vagina) and who do not have symptoms. Ask your health care provider if a screening pelvic exam is right for you.  If you have had past treatment for cervical cancer or a condition that could lead to cancer, you need Pap tests and screening for cancer for at least 20 years after your treatment. If Pap tests have been discontinued, your risk factors (such as having a new sexual partner) need to be reassessed to determine if screening should be resumed. Some women have medical problems that increase the chance of  getting cervical cancer. In these cases, your health care provider may recommend more frequent screening and Pap tests.  The HPV test is an additional test that may be used for cervical cancer screening. The HPV test looks for the virus that can cause the cell changes on the cervix. The cells collected during the Pap test can be tested for HPV. The HPV test could be used to screen women aged 11 years and older, and should be used in women of any age who have unclear Pap test results. After the age of 23, women should have HPV testing at the same frequency as a Pap test.  Colorectal cancer can be detected and often prevented. Most routine colorectal cancer screening begins at the age of 34 years and continues through age 34 years. However, your health care provider may recommend screening at an earlier age if you have risk factors for colon cancer. On a yearly basis, your health care provider  may provide home test kits to check for hidden blood in the stool. Use of a small camera at the end of a tube, to directly examine the colon (sigmoidoscopy or colonoscopy), can detect the earliest forms of colorectal cancer. Talk to your health care provider about this at age 49, when routine screening begins. Direct exam of the colon should be repeated every 5-10 years through age 56 years, unless early forms of pre-cancerous polyps or small growths are found.  People who are at an increased risk for hepatitis B should be screened for this virus. You are considered at high risk for hepatitis B if:  You were born in a country where hepatitis B occurs often. Talk with your health care provider about which countries are considered high risk.  Your parents were born in a high-risk country and you have not received a shot to protect against hepatitis B (hepatitis B vaccine).  You have HIV or AIDS.  You use needles to inject street drugs.  You live with, or have sex with, someone who has hepatitis B.  You get hemodialysis treatment.  You take certain medicines for conditions like cancer, organ transplantation, and autoimmune conditions.  Hepatitis C blood testing is recommended for all people born from [birth year] through 1965 and any individual with known risks for hepatitis C.  Practice safe sex. Use condoms and avoid high-risk sexual practices to reduce the spread of sexually transmitted infections (STIs). STIs include gonorrhea, chlamydia, syphilis, trichomonas, herpes, HPV, and human immunodeficiency virus (HIV). Herpes, HIV, and HPV are viral illnesses that have no cure. They can result in disability, cancer, and death.  You should be screened for sexually transmitted illnesses (STIs) including gonorrhea and chlamydia if:  You are sexually active and are younger than 24 years.  You are older than 24 years and your health care provider tells you that you are at risk for this type of  infection.  Your sexual activity has changed since you were last screened and you are at an increased risk for chlamydia or gonorrhea. Ask your health care provider if you are at risk.  If you are at risk of being infected with HIV, it is recommended that you take a prescription medicine daily to prevent HIV infection. This is called preexposure prophylaxis (PrEP). You are considered at risk if:  You are a heterosexual woman, are sexually active, and are at increased risk for HIV infection.  You take drugs by injection.  You are sexually active with a partner who has HIV.  Talk  with your health care provider about whether you are at high risk of being infected with HIV. If you choose to begin PrEP, you should first be tested for HIV. You should then be tested every 3 months for as long as you are taking PrEP.  Osteoporosis is a disease in which the bones lose minerals and strength with aging. This can result in serious bone fractures or breaks. The risk of osteoporosis can be identified using a bone density scan. Women ages 50 years and over and women at risk for fractures or osteoporosis should discuss screening with their health care providers. Ask your health care provider whether you should take a calcium supplement or vitamin D to reduce the rate of osteoporosis.  Menopause can be associated with physical symptoms and risks. Hormone replacement therapy is available to decrease symptoms and risks. You should talk to your health care provider about whether hormone replacement therapy is right for you.  Use sunscreen. Apply sunscreen liberally and repeatedly throughout the day. You should seek shade when your shadow is shorter than you. Protect yourself by wearing long sleeves, pants, a wide-brimmed hat, and sunglasses year round, whenever you are outdoors.  Once a month, do a whole body skin exam, using a mirror to look at the skin on your back. Tell your health care provider of new moles,  moles that have irregular borders, moles that are larger than a pencil eraser, or moles that have changed in shape or color.  Stay current with required vaccines (immunizations).  Influenza vaccine. All adults should be immunized every year.  Tetanus, diphtheria, and acellular pertussis (Td, Tdap) vaccine. Pregnant women should receive 1 dose of Tdap vaccine during each pregnancy. The dose should be obtained regardless of the length of time since the last dose. Immunization is preferred during the 27th-36th week of gestation. An adult who has not previously received Tdap or who does not know her vaccine status should receive 1 dose of Tdap. This initial dose should be followed by tetanus and diphtheria toxoids (Td) booster doses every 10 years. Adults with an unknown or incomplete history of completing a 3-dose immunization series with Td-containing vaccines should begin or complete a primary immunization series including a Tdap dose. Adults should receive a Td booster every 10 years.  Varicella vaccine. An adult without evidence of immunity to varicella should receive 2 doses or a second dose if she has previously received 1 dose. Pregnant females who do not have evidence of immunity should receive the first dose after pregnancy. This first dose should be obtained before leaving the health care facility. The second dose should be obtained 4-8 weeks after the first dose.  Human papillomavirus (HPV) vaccine. Females aged 13-26 years who have not received the vaccine previously should obtain the 3-dose series. The vaccine is not recommended for use in pregnant females. However, pregnancy testing is not needed before receiving a dose. If a female is found to be pregnant after receiving a dose, no treatment is needed. In that case, the remaining doses should be delayed until after the pregnancy. Immunization is recommended for any person with an immunocompromised condition through the age of 14 years if she  did not get any or all doses earlier. During the 3-dose series, the second dose should be obtained 4-8 weeks after the first dose. The third dose should be obtained 24 weeks after the first dose and 16 weeks after the second dose.  Zoster vaccine. One dose is recommended for adults aged  60 years or older unless certain conditions are present.  Measles, mumps, and rubella (MMR) vaccine. Adults born before 50 generally are considered immune to measles and mumps. Adults born in [birth year] or later should have 1 or more doses of MMR vaccine unless there is a contraindication to the vaccine or there is laboratory evidence of immunity to each of the three diseases. A routine second dose of MMR vaccine should be obtained at least 28 days after the first dose for students attending postsecondary schools, health care workers, or international travelers. People who received inactivated measles vaccine or an unknown type of measles vaccine during 1963-1967 should receive 2 doses of MMR vaccine. People who received inactivated mumps vaccine or an unknown type of mumps vaccine before 1979 and are at high risk for mumps infection should consider immunization with 2 doses of MMR vaccine. For females of childbearing age, rubella immunity should be determined. If there is no evidence of immunity, females who are not pregnant should be vaccinated. If there is no evidence of immunity, females who are pregnant should delay immunization until after pregnancy. Unvaccinated health care workers born before [birth year] who lack laboratory evidence of measles, mumps, or rubella immunity or laboratory confirmation of disease should consider measles and mumps immunization with 2 doses of MMR vaccine or rubella immunization with 1 dose of MMR vaccine.  Pneumococcal 13-valent conjugate (PCV13) vaccine. When indicated, a person who is uncertain of her immunization history and has no record of immunization should receive the PCV13 vaccine. An adult  aged 52 years or older who has certain medical conditions and has not been previously immunized should receive 1 dose of PCV13 vaccine. This PCV13 should be followed with a dose of pneumococcal polysaccharide (PPSV23) vaccine. The PPSV23 vaccine dose should be obtained at least 8 weeks after the dose of PCV13 vaccine. An adult aged 39 years or older who has certain medical conditions and previously received 1 or more doses of PPSV23 vaccine should receive 1 dose of PCV13. The PCV13 vaccine dose should be obtained 1 or more years after the last PPSV23 vaccine dose.  Pneumococcal polysaccharide (PPSV23) vaccine. When PCV13 is also indicated, PCV13 should be obtained first. All adults aged 36 years and older should be immunized. An adult younger than age 39 years who has certain medical conditions should be immunized. Any person who resides in a nursing home or long-term care facility should be immunized. An adult smoker should be immunized. People with an immunocompromised condition and certain other conditions should receive both PCV13 and PPSV23 vaccines. People with human immunodeficiency virus (HIV) infection should be immunized as soon as possible after diagnosis. Immunization during chemotherapy or radiation therapy should be avoided. Routine use of PPSV23 vaccine is not recommended for American Indians, [place] Natives, or people younger than 65 years unless there are medical conditions that require PPSV23 vaccine. When indicated, people who have unknown immunization and have no record of immunization should receive PPSV23 vaccine. One-time revaccination 5 years after the first dose of PPSV23 is recommended for people aged 19-64 years who have chronic kidney failure, nephrotic syndrome, asplenia, or immunocompromised conditions. People who received 1-2 doses of PPSV23 before age 109 years should receive another dose of PPSV23 vaccine at age 55 years or later if at least 5 years have passed since the previous  dose. Doses of PPSV23 are not needed for people immunized with PPSV23 at or after age 42 years.  Meningococcal vaccine. Adults with asplenia or persistent complement component deficiencies  should receive 2 doses of quadrivalent meningococcal conjugate (MenACWY-D) vaccine. The doses should be obtained at least 2 months apart. Microbiologists working with certain meningococcal bacteria, [employer] recruits, people at risk during an outbreak, and people who travel to or live in countries with a high rate of meningitis should be immunized. A first-year college student up through age 58 years who is living in a residence hall should receive a dose if she did not receive a dose on or after her 16th birthday. Adults who have certain high-risk conditions should receive one or more doses of vaccine.  Hepatitis A vaccine. Adults who wish to be protected from this disease, have certain high-risk conditions, work with hepatitis A-infected animals, work in hepatitis A research labs, or travel to or work in countries with a high rate of hepatitis A should be immunized. Adults who were previously unvaccinated and who anticipate close contact with an international adoptee during the first 60 days after arrival in the [place] from a country with a high rate of hepatitis A should be immunized.  Hepatitis B vaccine. Adults who wish to be protected from this disease, have certain high-risk conditions, may be exposed to blood or other infectious body fluids, are household contacts or sex partners of hepatitis B positive people, are clients or workers in certain care facilities, or travel to or work in countries with a high rate of hepatitis B should be immunized.  Haemophilus influenzae type b (Hib) vaccine. A previously unvaccinated person with asplenia or sickle cell disease or having a scheduled splenectomy should receive 1 dose of Hib vaccine. Regardless of previous immunization, a recipient of a hematopoietic stem cell  transplant should receive a 3-dose series 6-12 months after her successful transplant. Hib vaccine is not recommended for adults with HIV infection. Preventive Services / Frequency Ages 59 to 28 years  Blood pressure check.** / Every 1 to 2 years.  Lipid and cholesterol check.** / Every 5 years beginning at age 54.  Clinical breast exam.** / Every 3 years for women in their 74s and 64s.  BRCA-related cancer risk assessment.** / For women who have family members with a BRCA-related cancer (breast, ovarian, tubal, or peritoneal cancers).  Pap test.** / Every 2 years from ages 31 through 69. Every 3 years starting at age 3 through age 66 or 46 with a history of 3 consecutive normal Pap tests.  HPV screening.** / Every 3 years from ages 35 through ages 47 to 23 with a history of 3 consecutive normal Pap tests.  Hepatitis C blood test.** / For any individual with known risks for hepatitis C.  Skin self-exam. / Monthly.  Influenza vaccine. / Every year.  Tetanus, diphtheria, and acellular pertussis (Tdap, Td) vaccine.** / Consult your health care provider. Pregnant women should receive 1 dose of Tdap vaccine during each pregnancy. 1 dose of Td every 10 years.  Varicella vaccine.** / Consult your health care provider. Pregnant females who do not have evidence of immunity should receive the first dose after pregnancy.  HPV vaccine. / 3 doses over 6 months, if 25 and younger. The vaccine is not recommended for use in pregnant females. However, pregnancy testing is not needed before receiving a dose.  Measles, mumps, rubella (MMR) vaccine.** / You need at least 1 dose of MMR if you were born in 1957 or later. You may also need a 2nd dose. For females of childbearing age, rubella immunity should be determined. If there is no evidence of immunity, females  who are not pregnant should be vaccinated. If there is no evidence of immunity, females who are pregnant should delay immunization until after  pregnancy.  Pneumococcal 13-valent conjugate (PCV13) vaccine.** / Consult your health care provider.  Pneumococcal polysaccharide (PPSV23) vaccine.** / 1 to 2 doses if you smoke cigarettes or if you have certain conditions.  Meningococcal vaccine.** / 1 dose if you are age 59 to 59 years and a Market researcher living in a residence hall, or have one of several medical conditions, you need to get vaccinated against meningococcal disease. You may also need additional booster doses.  Hepatitis A vaccine.** / Consult your health care provider.  Hepatitis B vaccine.** / Consult your health care provider.  Haemophilus influenzae type b (Hib) vaccine.** / Consult your health care provider. Ages 27 to 17 years  Blood pressure check.** / Every 1 to 2 years.  Lipid and cholesterol check.** / Every 5 years beginning at age 6 years.  Lung cancer screening. / Every year if you are aged 54-80 years and have a 30-pack-year history of smoking and currently smoke or have quit within the past 15 years. Yearly screening is stopped once you have quit smoking for at least 15 years or develop a health problem that would prevent you from having lung cancer treatment.  Clinical breast exam.** / Every year after age 66 years.  BRCA-related cancer risk assessment.** / For women who have family members with a BRCA-related cancer (breast, ovarian, tubal, or peritoneal cancers).  Mammogram.** / Every year beginning at age 81 years and continuing for as long as you are in good health. Consult with your health care provider.  Pap test.** / Every 3 years starting at age 37 years through age 62 or 10 years with a history of 3 consecutive normal Pap tests.  HPV screening.** / Every 3 years from ages 37 years through ages 3 to 3 years with a history of 3 consecutive normal Pap tests.  Fecal occult blood test (FOBT) of stool. / Every year beginning at age 40 years and continuing until age 50 years. You may  not need to do this test if you get a colonoscopy every 10 years.  Flexible sigmoidoscopy or colonoscopy.** / Every 5 years for a flexible sigmoidoscopy or every 10 years for a colonoscopy beginning at age 56 years and continuing until age 6 years.  Hepatitis C blood test.** / For all people born from [birth year] through 1966 and any individual with known risks for hepatitis C.  Skin self-exam. / Monthly.  Influenza vaccine. / Every year.  Tetanus, diphtheria, and acellular pertussis (Tdap/Td) vaccine.** / Consult your health care provider. Pregnant women should receive 1 dose of Tdap vaccine during each pregnancy. 1 dose of Td every 10 years.  Varicella vaccine.** / Consult your health care provider. Pregnant females who do not have evidence of immunity should receive the first dose after pregnancy.  Zoster vaccine.** / 1 dose for adults aged 41 years or older.  Measles, mumps, rubella (MMR) vaccine.** / You need at least 1 dose of MMR if you were born in 1957 or later. You may also need a 2nd dose. For females of childbearing age, rubella immunity should be determined. If there is no evidence of immunity, females who are not pregnant should be vaccinated. If there is no evidence of immunity, females who are pregnant should delay immunization until after pregnancy.  Pneumococcal 13-valent conjugate (PCV13) vaccine.** / Consult your health care provider.  Pneumococcal polysaccharide (PPSV23)  vaccine.** / 1 to 2 doses if you smoke cigarettes or if you have certain conditions.  Meningococcal vaccine.** / Consult your health care provider.  Hepatitis A vaccine.** / Consult your health care provider.  Hepatitis B vaccine.** / Consult your health care provider.  Haemophilus influenzae type b (Hib) vaccine.** / Consult your health care provider. Ages 62 years and over  Blood pressure check.** / Every 1 to 2 years.  Lipid and cholesterol check.** / Every 5 years beginning at age 64 years.  Lung  cancer screening. / Every year if you are aged 20-80 years and have a 30-pack-year history of smoking and currently smoke or have quit within the past 15 years. Yearly screening is stopped once you have quit smoking for at least 15 years or develop a health problem that would prevent you from having lung cancer treatment.  Clinical breast exam.** / Every year after age 63 years.  BRCA-related cancer risk assessment.** / For women who have family members with a BRCA-related cancer (breast, ovarian, tubal, or peritoneal cancers).  Mammogram.** / Every year beginning at age 40 years and continuing for as long as you are in good health. Consult with your health care provider.  Pap test.** / Every 3 years starting at age 78 years through age 54 or 9 years with 3 consecutive normal Pap tests. Testing can be stopped between 65 and 70 years with 3 consecutive normal Pap tests and no abnormal Pap or HPV tests in the past 10 years.  HPV screening.** / Every 3 years from ages 39 years through ages 34 or 52 years with a history of 3 consecutive normal Pap tests. Testing can be stopped between 65 and 70 years with 3 consecutive normal Pap tests and no abnormal Pap or HPV tests in the past 10 years.  Fecal occult blood test (FOBT) of stool. / Every year beginning at age 56 years and continuing until age 31 years. You may not need to do this test if you get a colonoscopy every 10 years.  Flexible sigmoidoscopy or colonoscopy.** / Every 5 years for a flexible sigmoidoscopy or every 10 years for a colonoscopy beginning at age 64 years and continuing until age 1 years.  Hepatitis C blood test.** / For all people born from [birth year] through 1966 and any individual with known risks for hepatitis C.  Osteoporosis screening.** / A one-time screening for women ages 19 years and over and women at risk for fractures or osteoporosis.  Skin self-exam. / Monthly.  Influenza vaccine. / Every year.  Tetanus, diphtheria, and  acellular pertussis (Tdap/Td) vaccine.** / 1 dose of Td every 10 years.  Varicella vaccine.** / Consult your health care provider.  Zoster vaccine.** / 1 dose for adults aged 66 years or older.  Pneumococcal 13-valent conjugate (PCV13) vaccine.** / Consult your health care provider.  Pneumococcal polysaccharide (PPSV23) vaccine.** / 1 dose for all adults aged 74 years and older.  Meningococcal vaccine.** / Consult your health care provider.  Hepatitis A vaccine.** / Consult your health care provider.  Hepatitis B vaccine.** / Consult your health care provider.  Haemophilus influenzae type b (Hib) vaccine.** / Consult your health care provider. ** Family history and personal history of risk and conditions may change your health care provider's recommendations. Document Released: 07/10/2001 Document Revised: 09/28/2013 Document Reviewed: 10/09/2010 [facility] Patient Information 2015 [place], [place]. This information is not intended to replace advice given to you by your health care provider. Make sure you discuss any questions you  have with your health care provider.

## 2015-02-15 NOTE — Progress Notes (Signed)
Patient ID: Latoya [name], female   DOB: 1956/02/17, 59 y.o.   MRN: [id number]   Subjective:    Patient ID: Latoya [name], female    DOB: 28-Feb-1956, 59 y.o.   MRN: [id number]  Chief Complaint  Patient presents with  . Annual Exam    HPI Patient is in today for annual exam and follow-up on numerous concerns. She continues to struggle with a great deal of stress secondary to the follow-up from her divorce. They continue to work on dividing the property. No new illness. She does acknowledge she's been having some trouble with pruritus most notably in her scalp and she thinks it's related to stress. She's had no recent febrile illness. She reports her blood pressure has been running in the 144Y to 185U systolic. Denies CP/palp/SOB/HA/congestion/fevers/GI or GU c/o. Taking meds as prescribed  Past Medical History  Diagnosis Date  . Hyperlipidemia   . Hypertension   . Dermatitis 12/03/2007    Qualifier: Diagnosis of  By: Jerold Coombe    . Cervical cancer screening 12/30/2012  . Cancer 12/2009    BCC, SCC  . Eczema 01/04/2013  . Preventative health care 01/04/2013  . Morbid obesity 01/13/2012  . Overweight 01/13/2012  . Pain of toe of left foot 08/23/2014  . Anxiety     Past Surgical History  Procedure Laterality Date  . Dilation and currettage    . Mohs surgery  12/2009    basal cell     Family History  Problem Relation Age of Onset  . Heart disease Mother   . Diabetes Mother   . Heart disease Father     pacer  . Diabetes Father   . Hypertension Father     weight controlled  . Obesity Father   . Melanoma    . Obesity Sister     s/p lap band  . Cancer Maternal Grandmother     pancreatic  . Cancer Paternal Grandmother 2    liver  . Psoriasis Brother   . Obesity Brother   . Obesity Brother   . Diabetes Brother     diet controlled    Social History   Social History  . Marital Status: Married    Spouse Name: N/A  . Number of Children: N/A  . Years of Education: N/A    Occupational History  . 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 Smoking status: Former Research scientist (life sciences)  . Smokeless tobacco: Never Used  . Alcohol Use: Yes  . Drug Use: No  . Sexual Activity: Not on file     Comment: lives with. no dietary restrictions, works full time   Other Topics Concern  . Not on file   Social History Narrative    Outpatient Prescriptions Prior to Visit  Medication Sig Dispense Refill  . ALPRAZolam (XANAX) 0.25 MG tablet Take 1-2 tablets (0.25-0.5 mg total) by mouth at bedtime as needed for sleep or anxiety. 20 tablet 2  . aspirin 81 MG tablet Take 81 mg by mouth daily.      . cetirizine (ZYRTEC) 5 MG tablet Take 5 mg by mouth daily.    . citalopram (CELEXA) 20 MG tablet Take 1 tablet (20 mg total) by mouth daily. 30 tablet 6  . losartan (COZAAR) 100 MG tablet Take 1 tablet (100 mg total) by mouth daily. 30 tablet 7  . phentermine 15 MG capsule Take 1 capsule (15 mg total) by mouth every morning. 30 capsule 1  . atorvastatin (  LIPITOR) 40 MG tablet Take 1 tablet (40 mg total) by mouth daily at 6 PM. 90 tablet 3   No facility-administered medications prior to visit.    No Known Allergies  Review of Systems  Constitutional: Negative for fever and malaise/fatigue.  HENT: Negative for congestion.   Eyes: Negative for discharge.  Respiratory: Negative for shortness of breath.   Cardiovascular: Negative for chest pain, palpitations and leg swelling.  Gastrointestinal: Negative for nausea and abdominal pain.  Genitourinary: Negative for dysuria.  Musculoskeletal: Negative for falls.  Skin: Negative for rash.  Neurological: Negative for loss of consciousness and headaches.  Endo/Heme/Allergies: Negative for environmental allergies.  Psychiatric/Behavioral: Negative for depression. The patient is nervous/anxious.        Objective:    Physical Exam  Constitutional: She is oriented to person, place, and time. She appears well-developed and  well-nourished. No distress.  HENT:  Head: Normocephalic and atraumatic.  Eyes: Conjunctivae are normal.  Neck: Neck supple. No thyromegaly present.  Cardiovascular: Normal rate, regular rhythm and normal heart sounds.   No murmur heard. Pulmonary/Chest: Effort normal and breath sounds normal. No respiratory distress.  Abdominal: Soft. Bowel sounds are normal. She exhibits no distension and no mass. There is no tenderness.  Musculoskeletal: She exhibits no edema.  Lymphadenopathy:    She has no cervical adenopathy.  Neurological: She is alert and oriented to person, place, and time.  Skin: Skin is warm and dry.  Psychiatric: She has a normal mood and affect. Her behavior is normal.    BP 155/88 mmHg  Pulse 79  Temp(Src) 98.4 F (36.9 C) (Oral)  Ht 5\' 8"  (1.727 m)  Wt 189 lb 2 oz (85.787 kg)  BMI 28.76 kg/m2  SpO2 98% Wt Readings from Last 3 Encounters:  02/15/15 189 lb 2 oz (85.787 kg)  01/10/15 175 lb (79.379 kg)  08/17/14 175 lb (79.379 kg)     Lab Results  Component Value Date   WBC 5.2 02/08/2014   HGB 14.2 02/08/2014   HCT 42.1 02/08/2014   PLT 220.0 02/08/2014   GLUCOSE 88 02/08/2014   CHOL 204* 02/08/2014   TRIG 67.0 02/08/2014   HDL 84.70 02/08/2014   LDLDIRECT 144.9 12/23/2008   LDLCALC 106* 02/08/2014   ALT 30 02/08/2014   AST 24 02/08/2014   NA 138 02/08/2014   K 4.8 02/08/2014   CL 98 02/08/2014   CREATININE 0.9 02/08/2014   BUN 20 02/08/2014   CO2 30 02/08/2014   TSH 0.64 02/08/2014   HGBA1C 5.6 09/26/2009    Lab Results  Component Value Date   TSH 0.64 02/08/2014   Lab Results  Component Value Date   WBC 5.2 02/08/2014   HGB 14.2 02/08/2014   HCT 42.1 02/08/2014   MCV 98.5 02/08/2014   PLT 220.0 02/08/2014   Lab Results  Component Value Date   NA 138 02/08/2014   K 4.8 02/08/2014   CO2 30 02/08/2014   GLUCOSE 88 02/08/2014   BUN 20 02/08/2014   CREATININE 0.9 02/08/2014   BILITOT 0.5 02/08/2014   ALKPHOS 54 02/08/2014   AST  24 02/08/2014   ALT 30 02/08/2014   PROT 7.8 02/08/2014   ALBUMIN 4.5 02/08/2014   ALBUMIN 4.5 02/08/2014   CALCIUM 9.8 02/08/2014   GFR 71.82 02/08/2014   Lab Results  Component Value Date   CHOL 204* 02/08/2014   Lab Results  Component Value Date   HDL 84.70 02/08/2014   Lab Results  Component Value Date  [place] 106* 02/08/2014   Lab Results  Component Value Date   TRIG 67.0 02/08/2014   Lab Results  Component Value Date   CHOLHDL 2 02/08/2014   Lab Results  Component Value Date   HGBA1C 5.6 09/26/2009       Assessment & Plan:   Essential hypertension Mildly elevated, no changes to meds. Encouraged heart healthy diet such as the DASH diet and exercise as tolerated. Patient agrees to monitor her bp and let us know if it remains elevated. She reports it is better elsewhere today  Overweight Encouraged DASH diet, decrease po intake and increase exercise as tolerated. Needs 7-8 hours of sleep nightly. Avoid trans fats, eat small, frequent meals every 4-5 hours with lean proteins, complex carbs and healthy fats. Minimize simple carbs, GMO foods.  Preventative health care Patient encouraged to maintain heart healthy diet, regular exercise, adequate sleep. Consider daily probiotics. Take medications as prescribed. Labs reviewed. Given and reviewed copy of ACP documents from [facility] Secretary of State and encouraged to complete and return   I am having Ms. Kloss maintain her aspirin, cetirizine, losartan, citalopram, ALPRAZolam, phentermine, and atorvastatin.  Meds ordered this encounter  Medications  . atorvastatin (LIPITOR) 40 MG tablet    Sig: Take 1 tablet (40 mg total) by mouth daily at 6 PM.    Dispense:  90 tablet    Refill:  [place], LPN

## 2015-02-15 NOTE — Progress Notes (Signed)
Pre visit review using our clinic review tool, if applicable. No additional management support is needed unless otherwise documented below in the visit note. 

## 2015-02-16 ENCOUNTER — Telehealth: Payer: Self-pay

## 2015-02-16 LAB — HIV ANTIBODY (ROUTINE TESTING W REFLEX): HIV 1&2 Ab, 4th Generation: NONREACTIVE

## 2015-02-16 NOTE — Telephone Encounter (Signed)
-----   Message from Mosie Lukes, MD sent at 02/15/2015 11:16 PM EDT ----- Notify labs look good

## 2015-02-16 NOTE — Telephone Encounter (Signed)
Pt notified of results. No questions or concerns at this time.  

## 2015-02-16 NOTE — Telephone Encounter (Signed)
-----   Message from Mosie Lukes, MD sent at 02/16/2015 10:52 AM EDT ----- All labs including HIV are normal, good

## 2015-02-16 NOTE — Telephone Encounter (Signed)
Pt notified no questions or concerns at this tme.

## 2015-02-23 NOTE — Assessment & Plan Note (Signed)
Patient encouraged to maintain heart healthy diet, regular exercise, adequate sleep. Consider daily probiotics. Take medications as prescribed. Labs reviewed. Given and reviewed copy of ACP documents from [place] Secretary of State and encouraged to complete and return 

## 2015-02-23 NOTE — Telephone Encounter (Signed)
No additional notes

## 2015-02-23 NOTE — Assessment & Plan Note (Signed)
Encouraged DASH diet, decrease po intake and increase exercise as tolerated. Needs 7-8 hours of sleep nightly. Avoid trans fats, eat small, frequent meals every 4-5 hours with lean proteins, complex carbs and healthy fats. Minimize simple carbs, GMO foods. 

## 2015-02-23 NOTE — Assessment & Plan Note (Signed)
Mildly elevated, no changes to meds. Encouraged heart healthy diet such as the DASH diet and exercise as tolerated. Patient agrees to monitor her bp and let us know if it remains elevated. She reports it is better elsewhere today

## 2015-03-15 ENCOUNTER — Other Ambulatory Visit: Payer: Self-pay | Admitting: Family Medicine

## 2015-05-18 ENCOUNTER — Other Ambulatory Visit: Payer: Self-pay | Admitting: Family Medicine

## 2015-05-18 NOTE — Telephone Encounter (Signed)
Called left message to call back 

## 2015-05-18 NOTE — Telephone Encounter (Signed)
Requesting: Phentermine Contract  None UDS   None Last OV   02/15/2015 Last Refill   #30 with 2 refills on 02/15/2015  Please Advise

## 2015-05-18 NOTE — Telephone Encounter (Signed)
Patient informed of provider comments on refill request.

## 2015-06-07 ENCOUNTER — Ambulatory Visit: Payer: No Typology Code available for payment source | Admitting: Family Medicine

## 2015-06-14 ENCOUNTER — Ambulatory Visit (INDEPENDENT_AMBULATORY_CARE_PROVIDER_SITE_OTHER): Payer: No Typology Code available for payment source | Admitting: Family Medicine

## 2015-06-14 ENCOUNTER — Encounter: Payer: Self-pay | Admitting: Family Medicine

## 2015-06-14 ENCOUNTER — Ambulatory Visit (HOSPITAL_BASED_OUTPATIENT_CLINIC_OR_DEPARTMENT_OTHER)
Admission: RE | Admit: 2015-06-14 | Discharge: 2015-06-14 | Disposition: A | Discharge status: Home / Self Care | Payer: No Typology Code available for payment source | Source: Ambulatory Visit | Attending: Family Medicine | Admitting: Family Medicine

## 2015-06-14 VITALS — BP 130/76 | HR 89 | Temp 98.0°F | Ht 68.0 in | Wt 189.1 lb

## 2015-06-14 DIAGNOSIS — J329 Chronic sinusitis, unspecified: Secondary | ICD-10-CM

## 2015-06-14 DIAGNOSIS — R059 Cough, unspecified: Secondary | ICD-10-CM

## 2015-06-14 DIAGNOSIS — R05 Cough: Secondary | ICD-10-CM | POA: Insufficient documentation

## 2015-06-14 DIAGNOSIS — F329 Major depressive disorder, single episode, unspecified: Secondary | ICD-10-CM

## 2015-06-14 DIAGNOSIS — R0989 Other specified symptoms and signs involving the circulatory and respiratory systems: Secondary | ICD-10-CM | POA: Insufficient documentation

## 2015-06-14 DIAGNOSIS — I10 Essential (primary) hypertension: Secondary | ICD-10-CM

## 2015-06-14 DIAGNOSIS — E785 Hyperlipidemia, unspecified: Secondary | ICD-10-CM | POA: Diagnosis not present

## 2015-06-14 DIAGNOSIS — F418 Other specified anxiety disorders: Secondary | ICD-10-CM | POA: Diagnosis not present

## 2015-06-14 DIAGNOSIS — F419 Anxiety disorder, unspecified: Principal | ICD-10-CM

## 2015-06-14 DIAGNOSIS — E663 Overweight: Secondary | ICD-10-CM

## 2015-06-14 DIAGNOSIS — E782 Mixed hyperlipidemia: Secondary | ICD-10-CM

## 2015-06-14 LAB — LIPID PANEL
Cholesterol: 196 mg/dL (ref 0–200)
HDL: 101.7 mg/dL (ref >39.00)
LDL Cholesterol: 83 mg/dL (ref 0–99)
NONHDL: 94.43
Total CHOL/HDL Ratio: 2
Triglycerides: 57 mg/dL (ref 0.0–149.0)
VLDL: 11.4 mg/dL (ref 0.0–40.0)

## 2015-06-14 LAB — CBC WITH DIFFERENTIAL/PLATELET
BASOS ABS: 0.1 10*3/uL (ref 0.0–0.1)
Basophils Relative: 0.8 % (ref 0.0–3.0)
EOS PCT: 8.3 % — AB (ref 0.0–5.0)
Eosinophils Absolute: 0.6 10*3/uL (ref 0.0–0.7)
HCT: 43.2 % (ref 36.0–46.0)
Hemoglobin: 14.3 g/dL (ref 12.0–15.0)
LYMPHS ABS: 1.9 10*3/uL (ref 0.7–4.0)
Lymphocytes Relative: 27 % (ref 12.0–46.0)
MCHC: 33 g/dL (ref 30.0–36.0)
MCV: 97.6 fl (ref 78.0–100.0)
MONOS PCT: 7.9 % (ref 3.0–12.0)
Monocytes Absolute: 0.6 10*3/uL (ref 0.1–1.0)
NEUTROS ABS: 3.9 10*3/uL (ref 1.4–7.7)
NEUTROS PCT: 56 % (ref 43.0–77.0)
PLATELETS: 252 10*3/uL (ref 150.0–400.0)
RBC: 4.43 Mil/uL (ref 3.87–5.11)
RDW: 16.3 % — ABNORMAL HIGH (ref 11.5–15.5)
WBC: 7 10*3/uL (ref 4.0–10.5)

## 2015-06-14 LAB — COMPREHENSIVE METABOLIC PANEL
ALT: 36 U/L — AB (ref 0–35)
AST: 34 U/L (ref 0–37)
Albumin: 4.7 g/dL (ref 3.5–5.2)
Alkaline Phosphatase: 69 U/L (ref 39–117)
BUN: 15 mg/dL (ref 6–23)
CO2: 30 meq/L (ref 19–32)
Calcium: 9.8 mg/dL (ref 8.4–10.5)
Chloride: 101 mEq/L (ref 96–112)
Creatinine, Ser: 0.74 mg/dL (ref 0.40–1.20)
GFR: 85.03 mL/min (ref >60.00)
GLUCOSE: 82 mg/dL (ref 70–99)
POTASSIUM: 4.4 meq/L (ref 3.5–5.1)
SODIUM: 139 meq/L (ref 135–145)
TOTAL PROTEIN: 7.8 g/dL (ref 6.0–8.3)
Total Bilirubin: 0.7 mg/dL (ref 0.2–1.2)

## 2015-06-14 LAB — TSH: TSH: 0.73 u[IU]/mL (ref 0.35–4.50)

## 2015-06-14 MED ORDER — CEFDINIR 300 MG PO CAPS: 300.0000 mg | ORAL_CAPSULE | Freq: Two times a day (BID) | ORAL | Status: AC

## 2015-06-14 MED ORDER — CEFDINIR 300 MG PO CAPS: 300.0000 mg | ORAL_CAPSULE | Freq: Two times a day (BID) | ORAL | Status: DC

## 2015-06-14 MED ORDER — HYDROCODONE-HOMATROPINE 5-1.5 MG/5ML PO SYRP: 5.0000 mL | ORAL_SOLUTION | Freq: Three times a day (TID) | ORAL | Status: DC | PRN

## 2015-06-14 MED ORDER — CITALOPRAM HYDROBROMIDE 20 MG PO TABS: 20.0000 mg | ORAL_TABLET | Freq: Every day | ORAL | Status: DC

## 2015-06-14 MED ORDER — LOSARTAN POTASSIUM 100 MG PO TABS: ORAL_TABLET | ORAL | Status: DC

## 2015-06-14 MED ORDER — ATORVASTATIN CALCIUM 40 MG PO TABS: 40.0000 mg | ORAL_TABLET | Freq: Every day | ORAL | Status: DC

## 2015-06-14 MED FILL — CITALOPRAM HBR 20 MG TABLET: 20 | 34 days supply | Qty: 34 | Fill #0

## 2015-06-14 MED FILL — LOSARTAN POTASSIUM 100 MG T: 100 | 34 days supply | Qty: 34 | Fill #0

## 2015-06-14 MED FILL — ATORVASTATIN 40 MG TABLET: 40 | 34 days supply | Qty: 34 | Fill #0 | Status: TO

## 2015-06-14 MED FILL — HYDROCODONE-HOMATROPINE SYR: 5-1.5 | 12 days supply | Qty: 180 | Fill #0

## 2015-06-14 MED FILL — CEFDINIR 300 MG CAPSULE: 300 | 21 days supply | Qty: 42 | Fill #0

## 2015-06-14 NOTE — Patient Instructions (Addendum)
NOW company order online at [email or web address] NOW has a 10 strain probiotic Vitamin C 1000 mg Elderberry capsules,  Liquid, Zinc, capsule, Coldeeze, Xicam Aged or Black Garlic  Mucinex twice daily  Sinusitis, Adult Sinusitis is redness, soreness, and inflammation of the paranasal sinuses. Paranasal sinuses are air pockets within the bones of your face. They are located beneath your eyes, in the middle of your forehead, and above your eyes. In healthy paranasal sinuses, mucus is able to drain out, and air is able to circulate through them by way of your nose. However, when your paranasal sinuses are inflamed, mucus and air can become trapped. This can allow bacteria and other germs to grow and cause infection. Sinusitis can develop quickly and last only a short time (acute) or continue over a long period (chronic). Sinusitis that lasts for more than 12 weeks is considered chronic. CAUSES Causes of sinusitis include:  Allergies.  Structural abnormalities, such as displacement of the cartilage that separates your nostrils (deviated septum), which can decrease the air flow through your nose and sinuses and affect sinus drainage.  Functional abnormalities, such as when the small hairs (cilia) that line your sinuses and help remove mucus do not work properly or are not present. SIGNS AND SYMPTOMS Symptoms of acute and chronic sinusitis are the same. The primary symptoms are pain and pressure around the affected sinuses. Other symptoms include:  Upper toothache.  Earache.  Headache.  Bad breath.  Decreased sense of smell and taste.  A cough, which worsens when you are lying flat.  Fatigue.  Fever.  Thick drainage from your nose, which often is green and may contain pus (purulent).  Swelling and warmth over the affected sinuses. DIAGNOSIS Your health care provider will perform a physical exam. During your exam, your health care provider may perform any of the following to help  determine if you have acute sinusitis or chronic sinusitis:  Look in your nose for signs of abnormal growths in your nostrils (nasal polyps).  Tap over the affected sinus to check for signs of infection.  View the inside of your sinuses using an imaging device that has a light attached (endoscope). If your health care provider suspects that you have chronic sinusitis, one or more of the following tests may be recommended:  Allergy tests.  Nasal culture. A sample of mucus is taken from your nose, sent to a lab, and screened for bacteria.  Nasal cytology. A sample of mucus is taken from your nose and examined by your health care provider to determine if your sinusitis is related to an allergy. TREATMENT Most cases of acute sinusitis are related to a viral infection and will resolve on their own within 10 days. Sometimes, medicines are prescribed to help relieve symptoms of both acute and chronic sinusitis. These may include pain medicines, decongestants, nasal steroid sprays, or saline sprays. However, for sinusitis related to a bacterial infection, your health care provider will prescribe antibiotic medicines. These are medicines that will help kill the bacteria causing the infection. Rarely, sinusitis is caused by a fungal infection. In these cases, your health care provider will prescribe antifungal medicine. For some cases of chronic sinusitis, surgery is needed. Generally, these are cases in which sinusitis recurs more than 3 times per year, despite other treatments. HOME CARE INSTRUCTIONS  Drink plenty of water. Water helps thin the mucus so your sinuses can drain more easily.  Use a humidifier.  Inhale steam 3-4 times a day (for example, sit in the  bathroom with the shower running).  Apply a warm, moist washcloth to your face 3-4 times a day, or as directed by your health care provider.  Use saline nasal sprays to help moisten and clean your sinuses.  Take medicines only as  directed by your health care provider.  If you were prescribed either an antibiotic or antifungal medicine, finish it all even if you start to feel better. SEEK IMMEDIATE MEDICAL CARE IF:  You have increasing pain or severe headaches.  You have nausea, vomiting, or drowsiness.  You have swelling around your face.  You have vision problems.  You have a stiff neck.  You have difficulty breathing.   This information is not intended to replace advice given to you by your health care provider. Make sure you discuss any questions you have with your health care provider.   Document Released: 05/14/2005 Document Revised: 06/04/2014 Document Reviewed: 05/29/2011 Elsevier Interactive Patient Education [employer].

## 2015-06-14 NOTE — Assessment & Plan Note (Signed)
Well controlled, no changes to meds. Encouraged heart healthy diet such as the DASH diet and exercise as tolerated.  °

## 2015-06-14 NOTE — Progress Notes (Signed)
Pre visit review using our clinic review tool, if applicable. No additional management support is needed unless otherwise documented below in the visit note. 

## 2015-06-26 DIAGNOSIS — J329 Chronic sinusitis, unspecified: Secondary | ICD-10-CM | POA: Insufficient documentation

## 2015-06-26 DIAGNOSIS — R05 Cough: Secondary | ICD-10-CM | POA: Insufficient documentation

## 2015-06-26 DIAGNOSIS — R059 Cough, unspecified: Secondary | ICD-10-CM | POA: Insufficient documentation

## 2015-06-26 NOTE — Progress Notes (Signed)
Patient ID: Latoya [name], female   DOB: 13-Oct-1955, 60 y.o.   MRN: [id number]   Subjective:    Patient ID: Latoya [name], female    DOB: [birth year], 60 y.o.   MRN: [id number]  Chief Complaint  Patient presents with  . Cough    HPI Patient is in today for follow-up and respiratory concerns. She has been struggling with respiratory concerns off and on for about 2 months. She was treated at [facility] 3-4 months ago and given albuterol some antibiotics and Tessalon Perles. She reports she partially improved only to worsen again. At present she has both chest but mostly head congestion. Cough is sometimes dry spots productive of yellow to green phlegm. She denies fevers chills but she does endorse fatigue, malaise and myalgias. Denies CP/palp/fevers/GI or GU c/o. Taking meds as prescribed  Past Medical History  Diagnosis Date  . Hyperlipidemia   . Hypertension   . Dermatitis 12/03/2007    Qualifier: Diagnosis of  By: Jerold Coombe    . Cervical cancer screening 12/30/2012  . Cancer ([place]) 12/2009    BCC, SCC  . Eczema 01/04/2013  . Preventative health care 01/04/2013  . Morbid obesity ([place]) 01/13/2012  . Overweight 01/13/2012  . Pain of toe of left foot 08/23/2014  . Anxiety     Past Surgical History  Procedure Laterality Date  . Dilation and currettage    . Mohs surgery  12/2009    basal cell     Family History  Problem Relation Age of Onset  . Heart disease Mother   . Diabetes Mother   . Heart disease Father     pacer  . Diabetes Father   . Hypertension Father     weight controlled  . Obesity Father   . Melanoma    . Obesity Sister     s/p lap band  . Cancer Maternal Grandmother     pancreatic  . Cancer Paternal Grandmother 37    liver  . Psoriasis Brother   . Obesity Brother   . Obesity Brother   . Diabetes Brother     diet controlled    Social History   Social History  . Marital Status: Married    Spouse Name: N/A  . Number of Children: N/A  . Years of  Education: N/A   Occupational History  . 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 Smoking status: Former Research scientist (life sciences)  . Smokeless tobacco: Never Used  . Alcohol Use: Yes  . Drug Use: No  . Sexual Activity: Not on file     Comment: lives ALONE. no dietary restrictions, works full time Insurance underwriter   Other Topics Concern  . Not on file   Social History Narrative    Outpatient Prescriptions Prior to Visit  Medication Sig Dispense Refill  . ALPRAZolam (XANAX) 0.25 MG tablet Take 1-2 tablets (0.25-0.5 mg total) by mouth at bedtime as needed for sleep or anxiety. 20 tablet 2  . cetirizine (ZYRTEC) 5 MG tablet Take 5 mg by mouth daily.    . phentermine 15 MG capsule Take 1 capsule (15 mg total) by mouth every morning. 30 capsule 2  . atorvastatin (LIPITOR) 40 MG tablet Take 1 tablet (40 mg total) by mouth daily at 6 PM. 90 tablet 3  . citalopram (CELEXA) 20 MG tablet Take 1 tablet (20 mg total) by mouth daily. 30 tablet 6  . losartan (COZAAR) 100 MG tablet TAKE 1 TABLET (100 MG) BY  MOUTH DAILY. 30 tablet 7  . aspirin 81 MG tablet Take 81 mg by mouth daily. Reported on 06/14/2015     No facility-administered medications prior to visit.    No Known Allergies  Review of Systems  Constitutional: Positive for malaise/fatigue. Negative for fever.  HENT: Positive for congestion.   Eyes: Negative for discharge.  Respiratory: Positive for cough, sputum production and shortness of breath.   Cardiovascular: Negative for chest pain, palpitations and leg swelling.  Gastrointestinal: Negative for nausea and abdominal pain.  Genitourinary: Negative for dysuria.  Musculoskeletal: Negative for falls.  Skin: Negative for rash.  Neurological: Positive for headaches. Negative for loss of consciousness.  Endo/Heme/Allergies: Negative for environmental allergies.  Psychiatric/Behavioral: Negative for depression. The patient is not nervous/anxious.        Objective:    Physical Exam    Constitutional: She is oriented to person, place, and time. She appears well-developed and well-nourished. No distress.  HENT:  Head: Normocephalic and atraumatic.  Nose: Nose normal.  Eyes: Right eye exhibits no discharge. Left eye exhibits no discharge.  Neck: Normal range of motion. Neck supple.  Cardiovascular: Normal rate and regular rhythm.   No murmur heard. Pulmonary/Chest: Effort normal and breath sounds normal.  Decreased BS b/l bases  Abdominal: Soft. Bowel sounds are normal. There is no tenderness.  Musculoskeletal: She exhibits no edema.  Neurological: She is alert and oriented to person, place, and time.  Skin: Skin is warm and dry.  Psychiatric: She has a normal mood and affect.  Nursing note and vitals reviewed.   BP 130/76 mmHg  Pulse 89  Temp(Src) 98 F (36.7 C) (Oral)  Ht 5\' 8"  (1.727 m)  Wt 189 lb 2 oz (85.787 kg)  BMI 28.76 kg/m2  SpO2 97% Wt Readings from Last 3 Encounters:  06/14/15 189 lb 2 oz (85.787 kg)  02/15/15 189 lb 2 oz (85.787 kg)  01/10/15 175 lb (79.379 kg)     Lab Results  Component Value Date   WBC 7.0 06/14/2015   HGB 14.3 06/14/2015   HCT 43.2 06/14/2015   PLT 252.0 06/14/2015   GLUCOSE 82 06/14/2015   CHOL 196 06/14/2015   TRIG 57.0 06/14/2015   HDL 101.70 06/14/2015   LDLDIRECT 144.9 12/23/2008   LDLCALC 83 06/14/2015   ALT 36* 06/14/2015   AST 34 06/14/2015   NA 139 06/14/2015   K 4.4 06/14/2015   CL 101 06/14/2015   CREATININE 0.74 06/14/2015   BUN 15 06/14/2015   CO2 30 06/14/2015   TSH 0.73 06/14/2015   HGBA1C 5.6 09/26/2009    Lab Results  Component Value Date   TSH 0.73 06/14/2015   Lab Results  Component Value Date   WBC 7.0 06/14/2015   HGB 14.3 06/14/2015   HCT 43.2 06/14/2015   MCV 97.6 06/14/2015   PLT 252.0 06/14/2015   Lab Results  Component Value Date   NA 139 06/14/2015   K 4.4 06/14/2015   CO2 30 06/14/2015   GLUCOSE 82 06/14/2015   BUN 15 06/14/2015   CREATININE 0.74 06/14/2015    BILITOT 0.7 06/14/2015   ALKPHOS 69 06/14/2015   AST 34 06/14/2015   ALT 36* 06/14/2015   PROT 7.8 06/14/2015   ALBUMIN 4.7 06/14/2015   CALCIUM 9.8 06/14/2015   GFR 85.03 06/14/2015   Lab Results  Component Value Date   CHOL 196 06/14/2015   Lab Results  Component Value Date   HDL 101.70 06/14/2015   Lab Results  Component  Value Date   LDLCALC 83 06/14/2015   Lab Results  Component Value Date   TRIG 57.0 06/14/2015   Lab Results  Component Value Date   CHOLHDL 2 06/14/2015   Lab Results  Component Value Date   HGBA1C 5.6 09/26/2009       Assessment & Plan:   Problem List Items Addressed This Visit    Cough    CXR unremarkable, use Albuterol and Tessalon Perles prn      Relevant Medications   HYDROcodone-homatropine (HYCODAN) 5-1.5 MG/5ML syrup   Other Relevant Orders   CBC w/Diff (Completed)   DG Chest 2 View (Completed)   Essential hypertension    Well controlled, no changes to meds. Encouraged heart healthy diet such as the DASH diet and exercise as tolerated.       Relevant Medications   losartan (COZAAR) 100 MG tablet   atorvastatin (LIPITOR) 40 MG tablet   Other Relevant Orders   CBC w/Diff (Completed)   Comprehensive metabolic panel (Completed)   TSH (Completed)   Hyperlipidemia, mixed    Tolerating statin, encouraged heart healthy diet, avoid trans fats, minimize simple carbs and saturated fats. Increase exercise as tolerated      Relevant Medications   losartan (COZAAR) 100 MG tablet   atorvastatin (LIPITOR) 40 MG tablet   Overweight    Encouraged DASH diet, decrease po intake and increase exercise as tolerated. Needs 7-8 hours of sleep nightly. Avoid trans fats, eat small, frequent meals every 4-5 hours with lean proteins, complex carbs and healthy fats. Minimize simple carbs      Sinusitis    Encouraged increased rest and hydration, add probiotics, zinc such as Coldeze or Xicam. Treat fevers as needed. Started on Cefdinir       Relevant Medications   HYDROcodone-homatropine (HYCODAN) 5-1.5 MG/5ML syrup   Other Relevant Orders   CBC w/Diff (Completed)    Other Visit Diagnoses    Anxiety and depression    -  Primary    Relevant Medications    citalopram (CELEXA) 20 MG tablet    Hyperlipidemia        Relevant Medications    losartan (COZAAR) 100 MG tablet    atorvastatin (LIPITOR) 40 MG tablet    Other Relevant Orders    Lipid panel (Completed)       I am having Ms. Avis start on HYDROcodone-homatropine. I am also having her maintain her aspirin, cetirizine, ALPRAZolam, phentermine, PROAIR HFA, losartan, citalopram, and atorvastatin.  Meds ordered this encounter  Medications  . PROAIR HFA 108 (90 Base) MCG/ACT inhaler    Sig: Take 2 puffs by mouth every 6 (six) hours as needed.     Refill:  0  . losartan (COZAAR) 100 MG tablet    Sig: TAKE 1 TABLET (100 MG) BY MOUTH DAILY.    Dispense:  90 tablet    Refill:  2  . citalopram (CELEXA) 20 MG tablet    Sig: Take 1 tablet (20 mg total) by mouth daily.    Dispense:  90 tablet    Refill:  2  . atorvastatin (LIPITOR) 40 MG tablet    Sig: Take 1 tablet (40 mg total) by mouth daily at 6 PM.    Dispense:  90 tablet    Refill:  3  . DISCONTD: cefdinir (OMNICEF) 300 MG capsule    Sig: Take 1 capsule (300 mg total) by mouth 2 (two) times daily.    Dispense:  20 capsule    Refill:  0  . HYDROcodone-homatropine (HYCODAN) 5-1.5 MG/5ML syrup    Sig: Take 5 mLs by mouth every 8 (eight) hours as needed for cough.    Dispense:  180 mL    Refill:  0  . cefdinir (OMNICEF) 300 MG capsule    Sig: Take 1 capsule (300 mg total) by mouth 2 (two) times daily.    Dispense:  42 capsule    Refill:  0     Penni Homans, MD

## 2015-06-26 NOTE — Assessment & Plan Note (Signed)
Tolerating statin, encouraged heart healthy diet, avoid trans fats, minimize simple carbs and saturated fats. Increase exercise as tolerated 

## 2015-06-26 NOTE — Assessment & Plan Note (Signed)
Encouraged increased rest and hydration, add probiotics, zinc such as Coldeze or Xicam. Treat fevers as needed. Started on Cefdinir 

## 2015-06-26 NOTE — Assessment & Plan Note (Signed)
CXR unremarkable, use Albuterol and Tessalon Perles prn

## 2015-06-26 NOTE — Assessment & Plan Note (Signed)
Encouraged DASH diet, decrease po intake and increase exercise as tolerated. Needs 7-8 hours of sleep nightly. Avoid trans fats, eat small, frequent meals every 4-5 hours with lean proteins, complex carbs and healthy fats. Minimize simple carbs 

## 2015-07-18 MED FILL — LOSARTAN POTASSIUM 100 MG T: 100 | 34 days supply | Qty: 34 | Fill #1

## 2015-07-18 MED FILL — CITALOPRAM HBR 20 MG TABLET: 20 | 34 days supply | Qty: 34 | Fill #1

## 2015-08-26 MED FILL — CITALOPRAM HBR 20 MG TABLET: 20 | 34 days supply | Qty: 34 | Fill #2 | Status: TO

## 2015-08-26 MED FILL — LOSARTAN POTASSIUM 100 MG T: 100 | 34 days supply | Qty: 34 | Fill #2 | Status: TO

## 2015-10-13 ENCOUNTER — Encounter: Payer: Self-pay | Admitting: Medical

## 2015-10-13 ENCOUNTER — Ambulatory Visit (INDEPENDENT_AMBULATORY_CARE_PROVIDER_SITE_OTHER): Payer: No Typology Code available for payment source | Admitting: Medical

## 2015-10-13 VITALS — BP 118/76 | HR 87 | Temp 98.1°F | Ht 68.0 in | Wt 192.2 lb

## 2015-10-13 DIAGNOSIS — J309 Allergic rhinitis, unspecified: Secondary | ICD-10-CM

## 2015-10-13 DIAGNOSIS — R05 Cough: Secondary | ICD-10-CM | POA: Diagnosis not present

## 2015-10-13 DIAGNOSIS — J209 Acute bronchitis, unspecified: Secondary | ICD-10-CM | POA: Diagnosis not present

## 2015-10-13 DIAGNOSIS — R059 Cough, unspecified: Secondary | ICD-10-CM

## 2015-10-13 DIAGNOSIS — R062 Wheezing: Secondary | ICD-10-CM | POA: Diagnosis not present

## 2015-10-13 MED ORDER — CEFDINIR 300 MG PO CAPS: 300.0000 mg | ORAL_CAPSULE | Freq: Two times a day (BID) | ORAL | Status: DC

## 2015-10-13 MED ORDER — FLUTICASONE PROPIONATE 50 MCG/ACT NA SUSP: 2.0000 | Freq: Every day | NASAL | Status: DC

## 2015-10-13 MED ORDER — HYDROCODONE-HOMATROPINE 5-1.5 MG/5ML PO SYRP: 5.0000 mL | ORAL_SOLUTION | Freq: Three times a day (TID) | ORAL | Status: DC | PRN

## 2015-10-13 MED FILL — HYDROCODONE-HOMATROPINE SYR: 5-1.5 | 15 days supply | Qty: 120 | Fill #0

## 2015-10-13 MED FILL — FLUTICASONE PROP 50 MCG SPR: 50 | 30 days supply | Qty: 16 | Fill #0

## 2015-10-13 MED FILL — CEFDINIR 300 MG CAPSULE: 300 | 10 days supply | Qty: 20 | Fill #0

## 2015-10-13 NOTE — Progress Notes (Signed)
Subjective:    Patient ID: Latoya [name], female    DOB: 1956/04/04, 60 y.o.   MRN: [id number]  HPI  Pt in with one month of reported allergy symptoms. Started nasal congestion, sneezing, itching eyes, runny nose, and cough. Some wheezing at night. Some productive cough.   No fever, no chills or sweats.   Pt is using proair just recently but only used 3-4 times since being sick over past month.  Pt has no tender sinus.  Pt is on zyrtec.   Review of Systems  Constitutional: Negative for fever, chills and fatigue.  HENT: Positive for congestion, postnasal drip, sinus pressure and sneezing. Negative for sore throat.   Respiratory: Positive for cough and wheezing. Negative for shortness of breath.        Cough is productive but no constant productive  Cardiovascular: Negative for chest pain and palpitations.  Gastrointestinal: Negative for abdominal pain.  Musculoskeletal: Negative for back pain.  Skin: Negative for rash.  Neurological: Negative for dizziness and headaches.  Hematological: Negative for adenopathy. Does not bruise/bleed easily.  Psychiatric/Behavioral: Negative for behavioral problems and confusion.    Past Medical History  Diagnosis Date  . Hyperlipidemia   . Hypertension   . Dermatitis 12/03/2007    Qualifier: Diagnosis of  By: Jerold Coombe    . Cervical cancer screening 12/30/2012  . Cancer ([place]) 12/2009    BCC, SCC  . Eczema 01/04/2013  . Preventative health care 01/04/2013  . Morbid obesity ([place]) 01/13/2012  . Overweight 01/13/2012  . Pain of toe of left foot 08/23/2014  . Anxiety      Social History   Social History  . Marital Status: Married    Spouse Name: N/A  . Number of Children: N/A  . Years of Education: N/A   Occupational History  . 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 Smoking status: Former Research scientist (life sciences)  . Smokeless tobacco: Never Used  . Alcohol Use: Yes  . Drug Use: No  . Sexual Activity: Not on file     Comment:  lives ALONE. no dietary restrictions, works full time Insurance underwriter   Other Topics Concern  . Not on file   Social History Narrative    Past Surgical History  Procedure Laterality Date  . Dilation and currettage    . Mohs surgery  12/2009    basal cell     Family History  Problem Relation Age of Onset  . Heart disease Mother   . Diabetes Mother   . Heart disease Father     pacer  . Diabetes Father   . Hypertension Father     weight controlled  . Obesity Father   . Melanoma    . Obesity Sister     s/p lap band  . Cancer Maternal Grandmother     pancreatic  . Cancer Paternal Grandmother 49    liver  . Psoriasis Brother   . Obesity Brother   . Obesity Brother   . Diabetes Brother     diet controlled    No Known Allergies  Current Outpatient Prescriptions on File Prior to Visit  Medication Sig Dispense Refill  . ALPRAZolam (XANAX) 0.25 MG tablet Take 1-2 tablets (0.25-0.5 mg total) by mouth at bedtime as needed for sleep or anxiety. 20 tablet 2  . aspirin 81 MG tablet Take 81 mg by mouth daily. Reported on 06/14/2015    . atorvastatin (LIPITOR) 40 MG tablet Take 1 tablet (40 mg  total) by mouth daily at 6 PM. 90 tablet 3  . cetirizine (ZYRTEC) 5 MG tablet Take 5 mg by mouth daily.    . citalopram (CELEXA) 20 MG tablet Take 1 tablet (20 mg total) by mouth daily. 90 tablet 2  . losartan (COZAAR) 100 MG tablet TAKE 1 TABLET (100 MG) BY MOUTH DAILY. 90 tablet 2  . phentermine 15 MG capsule Take 1 capsule (15 mg total) by mouth every morning. 30 capsule 2  . PROAIR HFA 108 (90 Base) MCG/ACT inhaler Take 2 puffs by mouth every 6 (six) hours as needed.   0   No current facility-administered medications on file prior to visit.    BP 118/76 mmHg  Pulse 87  Temp(Src) 98.1 F (36.7 C) (Oral)  Ht 5\' 8"  (1.727 m)  Wt 192 lb 3.2 oz (87.181 kg)  BMI 29.23 kg/m2  SpO2 98%       Objective:   Physical Exam  General  Mental Status - Alert. General Appearance - Well groomed.  Not in acute distress.  Skin Rashes- No Rashes.  HEENT Head- Normal. Ear Auditory Canal - Left- Normal. Right - Normal.Tympanic Membrane- Left- Normal. Right- Normal. Eye Sclera/Conjunctiva- Left- Normal. Right- Normal. Nose & Sinuses Nasal Mucosa- Left-  Boggy and Congested. Right-  Boggy and  Congested.Bilateral no  maxillary and no  frontal sinus pressure. Mouth & Throat Lips: Upper Lip- Normal: no dryness, cracking, pallor, cyanosis, or vesicular eruption. Lower Lip-Normal: no dryness, cracking, pallor, cyanosis or vesicular eruption. Buccal Mucosa- Bilateral- No Aphthous ulcers. Oropharynx- No Discharge or Erythema. Tonsils: Characteristics- Bilateral- No Erythema or Congestion. Size/Enlargement- Bilateral- No enlargement. Discharge- bilateral-None.  Neck Neck- Supple. No Masses.   Chest and Lung Exam Auscultation: Breath Sounds:-Clear even and unlabored.  Cardiovascular Auscultation:Rythm- Regular, rate and rhythm. Murmurs & Other Heart Sounds:Ausculatation of the heart reveal- No Murmurs.  Lymphatic Head & Neck General Head & Neck Lymphatics: Bilateral: Description- No Localized lymphadenopathy.       Assessment & Plan:  Symptoms appear to have started as allergies. Continue zyrtec. Rx flonase and give depomedrol 40 mg im.  For cough rx hycodan.  For bronchitis rx cefdnir  Follow up in 7 days or as needed

## 2015-10-13 NOTE — Patient Instructions (Addendum)
Symptoms appear to have started as allergies. Continue zyrtec. Rx flonase and give depomedrol 40 mg im.  For cough rx hycodan.  For bronchitis rx cefdnir  Follow up in 7 days or as needed

## 2015-10-13 NOTE — Progress Notes (Signed)
Pre visit review using our clinic review tool, if applicable. No additional management support is needed unless otherwise documented below in the visit note. 

## 2015-10-20 MED FILL — ATORVASTATIN 40 MG TABLET: 40 | 34 days supply | Qty: 34 | Fill #4

## 2015-10-27 ENCOUNTER — Encounter: Payer: Self-pay | Admitting: Medical

## 2015-10-27 ENCOUNTER — Ambulatory Visit (INDEPENDENT_AMBULATORY_CARE_PROVIDER_SITE_OTHER): Payer: No Typology Code available for payment source | Admitting: Medical

## 2015-10-27 VITALS — BP 114/76 | HR 75 | Temp 97.9°F | Ht 68.0 in | Wt 192.8 lb

## 2015-10-27 DIAGNOSIS — T7840XA Allergy, unspecified, initial encounter: Secondary | ICD-10-CM | POA: Diagnosis not present

## 2015-10-27 MED ORDER — METHYLPREDNISOLONE ACETATE 40 MG/ML IJ SUSP
40.0000 mg | Freq: Once | INTRAMUSCULAR | Status: AC
  Administered 2015-10-27: 40 mg via INTRAMUSCULAR

## 2015-10-27 MED ORDER — PREDNISONE 10 MG PO TABS: ORAL_TABLET | ORAL | Status: DC

## 2015-10-27 MED ORDER — HYDROXYZINE HCL 25 MG PO TABS: 25.0000 mg | ORAL_TABLET | Freq: Three times a day (TID) | ORAL | Status: DC | PRN

## 2015-10-27 MED FILL — predniSONE 10 MG TABS: 10 | 6 days supply | Qty: 21 | Fill #0

## 2015-10-27 MED FILL — hydrOXYzine HCL 25 MG TABS: 25 | 10 days supply | Qty: 30 | Fill #0

## 2015-10-27 NOTE — Progress Notes (Signed)
Pre visit review using our clinic review tool, if applicable. No additional management support is needed unless otherwise documented below in the visit note. 

## 2015-10-27 NOTE — Patient Instructions (Addendum)
The exact etiology of your  allergic reaction is  unknown . We gave you depo-medrol 40 mg im injection. I am also prescribing oral prednisone and hydroxyzine for itching. Your rash should gradually improve. If worsening or expanding  please notify us. If your rash reoccurs intermittently and no cause is identified then could consider allergist referral.  Follow up in 7 days or as needed.

## 2015-10-27 NOTE — Progress Notes (Signed)
Subjective:    Patient ID: Latoya [name], female    DOB: 1955/08/04, 60 y.o.   MRN: [id number]  HPI  Pt has recent rash that itches since this am. Reports rash occurred after no known particular exposure. On review pt does not report any suspicious exposure to soaps, creams, detergents, make up, lotions, detergents, animal exposure exposure, plants or insect bites(only mentioned some zyquil use last night and chicken and cashews last 2 nights).  Pt rash is in the area of. Pt reports no shortness of breath or wheezing. . Pt is not diabetic.  Pt mentions that her rt lobe was little red and swollen yesterday. Then today when she woke up had diffuse scattered rash all over with itching.   Pt does have some eczema in the past.    Review of Systems  Constitutional: Negative for fever, chills and fatigue.  Respiratory: Negative for cough, chest tightness, shortness of breath and wheezing.   Cardiovascular: Negative for chest pain and palpitations.  Gastrointestinal: Negative for abdominal pain.  Musculoskeletal: Negative for back pain and gait problem.  Skin: Positive for rash.  Hematological: Negative for adenopathy. Does not bruise/bleed easily.  Psychiatric/Behavioral: Negative for behavioral problems and confusion.    Past Medical History  Diagnosis Date  . Hyperlipidemia   . Hypertension   . Dermatitis 12/03/2007    Qualifier: Diagnosis of  By: Jerold Coombe    . Cervical cancer screening 12/30/2012  . Cancer ([place]) 12/2009    BCC, SCC  . Eczema 01/04/2013  . Preventative health care 01/04/2013  . Morbid obesity ([place]) 01/13/2012  . Overweight 01/13/2012  . Pain of toe of left foot 08/23/2014  . Anxiety      Social History   Social History  . Marital Status: Married    Spouse Name: N/A  . Number of Children: N/A  . Years of Education: N/A   Occupational History  . 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 Smoking status: Former Research scientist (life sciences)  . Smokeless tobacco:  Never Used  . Alcohol Use: Yes  . Drug Use: No  . Sexual Activity: Not on file     Comment: lives ALONE. no dietary restrictions, works full time Insurance underwriter   Other Topics Concern  . Not on file   Social History Narrative    Past Surgical History  Procedure Laterality Date  . Dilation and currettage    . Mohs surgery  12/2009    basal cell     Family History  Problem Relation Age of Onset  . Heart disease Mother   . Diabetes Mother   . Heart disease Father     pacer  . Diabetes Father   . Hypertension Father     weight controlled  . Obesity Father   . Melanoma    . Obesity Sister     s/p lap band  . Cancer Maternal Grandmother     pancreatic  . Cancer Paternal Grandmother 54    liver  . Psoriasis Brother   . Obesity Brother   . Obesity Brother   . Diabetes Brother     diet controlled    No Known Allergies  Current Outpatient Prescriptions on File Prior to Visit  Medication Sig Dispense Refill  . ALPRAZolam (XANAX) 0.25 MG tablet Take 1-2 tablets (0.25-0.5 mg total) by mouth at bedtime as needed for sleep or anxiety. 20 tablet 2  . aspirin 81 MG tablet Take 81 mg by mouth daily. Reported  on 06/14/2015    . atorvastatin (LIPITOR) 40 MG tablet Take 1 tablet (40 mg total) by mouth daily at 6 PM. 90 tablet 3  . cetirizine (ZYRTEC) 5 MG tablet Take 5 mg by mouth daily.    . citalopram (CELEXA) 20 MG tablet Take 1 tablet (20 mg total) by mouth daily. 90 tablet 2  . losartan (COZAAR) 100 MG tablet TAKE 1 TABLET (100 MG) BY MOUTH DAILY. 90 tablet 2  . phentermine 15 MG capsule Take 1 capsule (15 mg total) by mouth every morning. 30 capsule 2  . PROAIR HFA 108 (90 Base) MCG/ACT inhaler Take 2 puffs by mouth every 6 (six) hours as needed.   0   No current facility-administered medications on file prior to visit.    BP 114/76 mmHg  Pulse 75  Temp(Src) 97.9 F (36.6 C) (Oral)  Ht 5\' 8"  (1.727 m)  Wt 192 lb 12.8 oz (87.454 kg)  BMI 29.32 kg/m2  SpO2 98%         Objective:   Physical Exam  General Mental Status- Alert. General Appearance- Not in acute distress.   Skin Scattered red rash all over with scattered papular rash all over. Arms appear worse. Some rash on face as well.  Neck Carotid Arteries- Normal color. Moisture- Normal Moisture. No carotid bruits. No JVD.  Chest and Lung Exam Auscultation: Breath Sounds:-Normal.  Cardiovascular Auscultation:Rythm- Regular. Murmurs & Other Heart Sounds:Auscultation of the heart reveals- No Murmurs.  Abdomen Inspection:-Inspeection Normal. Palpation/Percussion:Note:No mass. Palpation and Percussion of the abdomen reveal- Non Tender, Non Distended + BS, no rebound or guarding.   Neurologic Cranial Nerve exam:- CN III-XII intact(No nystagmus), symmetric smile. Strength:- 5/5 equal and symmetric strength both upper and lower extremities.      Assessment & Plan:  The exact etiology of your  allergic reaction is  unknown . We gave you depo-medrol 80 mg im injection. I am also prescribing oral prednisone and hydroxyzine for itching. Your rash should gradually improve. If worsening or expanding  please notify us. If your rash reoccurs intermittently and no cause is identified then could consider allergist referral.  Follow up in 7 days or as needed.

## 2015-10-28 MED FILL — LOSARTAN POTASSIUM 100 MG T: 100 | 34 days supply | Qty: 34 | Fill #0

## 2015-11-09 MED FILL — CITALOPRAM HBR 20 MG TABLET: 20 | 34 days supply | Qty: 34 | Fill #0

## 2015-11-22 MED FILL — ATORVASTATIN 40 MG TABLET: 40 | 30 days supply | Qty: 30 | Fill #5

## 2015-11-22 MED FILL — LOSARTAN POTASSIUM 100 MG T: 100 | 30 days supply | Qty: 30 | Fill #1

## 2015-12-13 ENCOUNTER — Ambulatory Visit: Payer: No Typology Code available for payment source | Admitting: Family Medicine

## 2015-12-13 MED FILL — CITALOPRAM HBR 20 MG TABLET: 20 | 34 days supply | Qty: 34 | Fill #1

## 2015-12-21 MED FILL — LOSARTAN POTASSIUM 100 MG T: 100 | 30 days supply | Qty: 30 | Fill #2

## 2015-12-21 MED FILL — ATORVASTATIN 40 MG TABLET: 40 | 30 days supply | Qty: 30 | Fill #6

## 2016-01-03 ENCOUNTER — Telehealth: Payer: Self-pay | Admitting: Family Medicine

## 2016-01-03 ENCOUNTER — Other Ambulatory Visit: Payer: Self-pay | Admitting: Family Medicine

## 2016-01-03 DIAGNOSIS — Z1239 Encounter for other screening for malignant neoplasm of breast: Secondary | ICD-10-CM

## 2016-01-03 NOTE — Telephone Encounter (Signed)
Pt requested appt thru mychart for 3d mammogram at [facility]. Please notify pt when order entered. Thank you.

## 2016-01-03 NOTE — Telephone Encounter (Signed)
Called the patient left her a detailed message mammogram has been ordered.

## 2016-01-09 ENCOUNTER — Ambulatory Visit (HOSPITAL_BASED_OUTPATIENT_CLINIC_OR_DEPARTMENT_OTHER)
Admission: RE | Admit: 2016-01-09 | Discharge: 2016-01-09 | Disposition: A | Discharge status: Home / Self Care | Payer: No Typology Code available for payment source | Source: Ambulatory Visit | Attending: Family Medicine | Admitting: Family Medicine

## 2016-01-09 DIAGNOSIS — R921 Mammographic calcification found on diagnostic imaging of breast: Secondary | ICD-10-CM | POA: Diagnosis not present

## 2016-01-09 DIAGNOSIS — Z1239 Encounter for other screening for malignant neoplasm of breast: Secondary | ICD-10-CM

## 2016-01-09 DIAGNOSIS — Z1231 Encounter for screening mammogram for malignant neoplasm of breast: Secondary | ICD-10-CM | POA: Diagnosis not present

## 2016-01-10 ENCOUNTER — Other Ambulatory Visit: Payer: Self-pay | Admitting: Family Medicine

## 2016-01-10 DIAGNOSIS — R928 Other abnormal and inconclusive findings on diagnostic imaging of breast: Secondary | ICD-10-CM

## 2016-01-18 ENCOUNTER — Other Ambulatory Visit: Payer: Self-pay | Admitting: Family Medicine

## 2016-01-18 ENCOUNTER — Ambulatory Visit
Admission: RE | Admit: 2016-01-18 | Discharge: 2016-01-18 | Disposition: A | Discharge status: Home / Self Care | Payer: No Typology Code available for payment source | Source: Ambulatory Visit | Attending: Family Medicine | Admitting: Family Medicine

## 2016-01-18 DIAGNOSIS — R928 Other abnormal and inconclusive findings on diagnostic imaging of breast: Secondary | ICD-10-CM

## 2016-01-18 MED FILL — ATORVASTATIN 40 MG TABLET: 40 | 30 days supply | Qty: 30 | Fill #7

## 2016-01-18 MED FILL — CITALOPRAM HBR 20 MG TABLET: 20 | 34 days supply | Qty: 34 | Fill #2

## 2016-01-27 ENCOUNTER — Ambulatory Visit (HOSPITAL_BASED_OUTPATIENT_CLINIC_OR_DEPARTMENT_OTHER)
Admission: RE | Admit: 2016-01-27 | Discharge: 2016-01-27 | Disposition: A | Discharge status: Home / Self Care | Payer: No Typology Code available for payment source | Source: Ambulatory Visit | Attending: Medical | Admitting: Medical

## 2016-01-27 ENCOUNTER — Ambulatory Visit: Payer: No Typology Code available for payment source | Admitting: Family Medicine

## 2016-01-27 ENCOUNTER — Encounter: Payer: Self-pay | Admitting: Medical

## 2016-01-27 ENCOUNTER — Ambulatory Visit (INDEPENDENT_AMBULATORY_CARE_PROVIDER_SITE_OTHER): Payer: No Typology Code available for payment source | Admitting: Medical

## 2016-01-27 VITALS — BP 123/85 | HR 96 | Temp 98.2°F | Ht 68.0 in | Wt 199.4 lb

## 2016-01-27 DIAGNOSIS — R062 Wheezing: Secondary | ICD-10-CM

## 2016-01-27 DIAGNOSIS — F411 Generalized anxiety disorder: Secondary | ICD-10-CM | POA: Diagnosis not present

## 2016-01-27 DIAGNOSIS — R1011 Right upper quadrant pain: Secondary | ICD-10-CM | POA: Insufficient documentation

## 2016-01-27 DIAGNOSIS — K760 Fatty (change of) liver, not elsewhere classified: Secondary | ICD-10-CM | POA: Diagnosis not present

## 2016-01-27 DIAGNOSIS — F419 Anxiety disorder, unspecified: Secondary | ICD-10-CM

## 2016-01-27 DIAGNOSIS — E669 Obesity, unspecified: Secondary | ICD-10-CM

## 2016-01-27 DIAGNOSIS — J209 Acute bronchitis, unspecified: Secondary | ICD-10-CM

## 2016-01-27 LAB — CBC WITH DIFFERENTIAL/PLATELET
BASOS ABS: 0.1 10*3/uL (ref 0.0–0.1)
Basophils Relative: 0.9 % (ref 0.0–3.0)
EOS ABS: 0.6 10*3/uL (ref 0.0–0.7)
Eosinophils Relative: 9.3 % — ABNORMAL HIGH (ref 0.0–5.0)
HEMATOCRIT: 40.4 % (ref 36.0–46.0)
HEMOGLOBIN: 13.8 g/dL (ref 12.0–15.0)
LYMPHS PCT: 25.6 % (ref 12.0–46.0)
Lymphs Abs: 1.7 10*3/uL (ref 0.7–4.0)
MCHC: 34.1 g/dL (ref 30.0–36.0)
MCV: 98.3 fl (ref 78.0–100.0)
MONOS PCT: 6.7 % (ref 3.0–12.0)
Monocytes Absolute: 0.5 10*3/uL (ref 0.1–1.0)
NEUTROS ABS: 3.8 10*3/uL (ref 1.4–7.7)
Neutrophils Relative %: 57.5 % (ref 43.0–77.0)
PLATELETS: 224 10*3/uL (ref 150.0–400.0)
RBC: 4.11 Mil/uL (ref 3.87–5.11)
RDW: 15 % (ref 11.5–15.5)
WBC: 6.7 10*3/uL (ref 4.0–10.5)

## 2016-01-27 LAB — AMYLASE: Amylase: 44 U/L (ref 27–131)

## 2016-01-27 LAB — COMPREHENSIVE METABOLIC PANEL
ALK PHOS: 61 U/L (ref 39–117)
ALT: 41 U/L — AB (ref 0–35)
AST: 35 U/L (ref 0–37)
Albumin: 4.5 g/dL (ref 3.5–5.2)
BILIRUBIN TOTAL: 0.4 mg/dL (ref 0.2–1.2)
BUN: 15 mg/dL (ref 6–23)
CALCIUM: 9.1 mg/dL (ref 8.4–10.5)
CO2: 30 meq/L (ref 19–32)
CREATININE: 0.82 mg/dL (ref 0.40–1.20)
Chloride: 103 mEq/L (ref 96–112)
GFR: 75.37 mL/min (ref >60.00)
Glucose, Bld: 85 mg/dL (ref 70–99)
Potassium: 4.1 mEq/L (ref 3.5–5.1)
Sodium: 140 mEq/L (ref 135–145)
TOTAL PROTEIN: 7.3 g/dL (ref 6.0–8.3)

## 2016-01-27 LAB — LIPASE: LIPASE: 32 U/L (ref 11.0–59.0)

## 2016-01-27 MED ORDER — PROAIR HFA 108 (90 BASE) MCG/ACT IN AERS
2.0000 | INHALATION_SPRAY | Freq: Four times a day (QID) | RESPIRATORY_TRACT | 0 refills | Status: DC | PRN
Start: 2016-01-27 — End: 2016-05-15

## 2016-01-27 MED ORDER — PREDNISONE 10 MG PO TABS: ORAL_TABLET | ORAL | 0 refills | Status: DC

## 2016-01-27 MED ORDER — ALPRAZOLAM 0.25 MG PO TABS: 0.2500 mg | ORAL_TABLET | Freq: Every evening | ORAL | 0 refills | Status: DC | PRN

## 2016-01-27 MED ORDER — LORCASERIN HCL 10 MG PO TABS: ORAL_TABLET | ORAL | 0 refills | Status: DC

## 2016-01-27 MED ORDER — DOXYCYCLINE HYCLATE 100 MG PO TABS: 100.0000 mg | ORAL_TABLET | Freq: Two times a day (BID) | ORAL | 0 refills | Status: DC

## 2016-01-27 MED ORDER — BECLOMETHASONE DIPROPIONATE 40 MCG/ACT IN AERS: 2.0000 | INHALATION_SPRAY | Freq: Two times a day (BID) | RESPIRATORY_TRACT | 2 refills | Status: DC

## 2016-01-27 MED FILL — DOXYCYCLINE HYCLATE 100 MG: 100 | 7 days supply | Qty: 14 | Fill #0

## 2016-01-27 MED FILL — BELVIQ 10 MG TABLET: 10 | 14 days supply | Qty: 28 | Fill #0

## 2016-01-27 MED FILL — VENTOLIN HFA 90 MCG INHALER: 108 (90 BAS | 30 days supply | Qty: 18 | Fill #0

## 2016-01-27 MED FILL — ALPRAZolam 0.25 MG TABS: 0.25 | 10 days supply | Qty: 20 | Fill #0

## 2016-01-27 MED FILL — predniSONE 10 MG TABS: 10 | 6 days supply | Qty: 21 | Fill #0

## 2016-01-27 NOTE — Telephone Encounter (Signed)
Called patient to discuss medication. Advised per ES to call pharmacy before coming to pick up to see if it will need PA. Patient agreed.

## 2016-01-27 NOTE — Progress Notes (Signed)
Subjective:    Patient ID: Latoya [name], female    DOB: May 13, 1956, 60 y.o.   MRN: [id number]  HPI  Pt in with coughing and wheezing for about a week. Cough is worse at night. Occasionally bringing mucous. No fever, no chills or sweats. Pt used proair about a year ago when had bronchitis. Quit smoking in 2008.    Pt also reports some pain rt upper quadrant pain dull low level pain for 3 months. She did not notice any pain after eating. No nausea or vomiting. No fever, no chills or sweats accociated with that. No rash on her skin. No asscociation with exercise. About a week or so since last felt pain  Pt request phentermine rx. She wants to loose weight again. She had been on it in past. When used in the past lost 40 lb. Gradually gaining it back. Pt bp is good today.  Pt has anxiety and insomnia. She rarely uses xanax and might refill.   Review of Systems  Constitutional: Negative for chills, fatigue and fever.  HENT: Positive for congestion. Negative for facial swelling, postnasal drip, sinus pressure, sneezing and sore throat.   Respiratory: Positive for cough and wheezing. Negative for stridor.        Chest congestion.  Gastrointestinal: Negative for abdominal pain, constipation, diarrhea, nausea, rectal pain and vomiting.       See hpi.  Musculoskeletal: Negative for back pain.  Hematological: Negative for adenopathy. Does not bruise/bleed easily.  Psychiatric/Behavioral: Negative for agitation, confusion and self-injury. The patient is not nervous/anxious.     Past Medical History:  Diagnosis Date  . Anxiety   . Cancer ([place]) 12/2009   BCC, SCC  . Cervical cancer screening 12/30/2012  . Dermatitis 12/03/2007   Qualifier: Diagnosis of  By: Jerold Coombe    . Eczema 01/04/2013  . Hyperlipidemia   . Hypertension   . Morbid obesity ([place]) 01/13/2012  . Overweight 01/13/2012  . Pain of toe of left foot 08/23/2014  . Preventative health care 01/04/2013     Social History    Social History  . Marital status: Married    Spouse name: N/A  . Number of children: N/A  . Years of education: N/A   Occupational History  . 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 Smoking status: Former Research scientist (life sciences)  . Smokeless tobacco: Never Used  . Alcohol use Yes  . Drug use: No  . Sexual activity: Not on file     Comment: lives ALONE. no dietary restrictions, works full time Insurance underwriter   Other Topics Concern  . Not on file   Social History Narrative  . No narrative on file    Past Surgical History:  Procedure Laterality Date  . dilation and currettage    . MOHS SURGERY  12/2009   basal cell     Family History  Problem Relation Age of Onset  . Heart disease Mother   . Diabetes Mother   . Heart disease Father     pacer  . Diabetes Father   . Hypertension Father     weight controlled  . Obesity Father   . Melanoma    . Obesity Sister     s/p lap band  . Cancer Maternal Grandmother     pancreatic  . Cancer Paternal Grandmother 53    liver  . Psoriasis Brother   . Obesity Brother   . Obesity Brother   . Diabetes Brother  diet controlled    No Known Allergies  Current Outpatient Prescriptions on File Prior to Visit  Medication Sig Dispense Refill  . atorvastatin (LIPITOR) 40 MG tablet Take 1 tablet (40 mg total) by mouth daily at 6 PM. 90 tablet 3  . cetirizine (ZYRTEC) 5 MG tablet Take 5 mg by mouth daily.    . citalopram (CELEXA) 20 MG tablet Take 1 tablet (20 mg total) by mouth daily. 90 tablet 2  . losartan (COZAAR) 100 MG tablet TAKE 1 TABLET (100 MG) BY MOUTH DAILY. 90 tablet 2  . aspirin 81 MG tablet Take 81 mg by mouth daily. Reported on 06/14/2015    . hydrOXYzine (ATARAX/VISTARIL) 25 MG tablet Take 1 tablet (25 mg total) by mouth 3 (three) times daily as needed. (Patient not taking: Reported on 01/27/2016) 30 tablet 0  . phentermine 15 MG capsule Take 1 capsule (15 mg total) by mouth every morning. (Patient not  taking: Reported on 01/27/2016) 30 capsule 2   No current facility-administered medications on file prior to visit.     BP 123/85   Pulse 96   Temp 98.2 F (36.8 C) (Oral)   Ht 5\' 8"  (1.727 m)   Wt 199 lb 6.4 oz (90.4 kg)   SpO2 95%   BMI 30.32 kg/m       Objective:   Physical Exam  General  Mental Status - Alert. General Appearance - Well groomed. Not in acute distress.  Skin Rashes- No Rashes.  HEENT Head- Normal. Ear Auditory Canal - Left- Normal. Right - Normal.Tympanic Membrane- Left- Normal. Right- Normal. Eye Sclera/Conjunctiva- Left- Normal. Right- Normal. Nose & Sinuses Nasal Mucosa- Left-   Not Boggy and Congested. Right-   Not Boggy and  Congested.Bilateral  No maxillary and  No frontal sinus pressure. Mouth & Throat Lips: Upper Lip- Normal: no dryness, cracking, pallor, cyanosis, or vesicular eruption. Lower Lip-Normal: no dryness, cracking, pallor, cyanosis or vesicular eruption. Buccal Mucosa- Bilateral- No Aphthous ulcers. Oropharynx- No Discharge or Erythema. Tonsils: Characteristics- Bilateral- No Erythema or Congestion. Size/Enlargement- Bilateral- No enlargement. Discharge- bilateral-None.  Neck Neck- Supple. No Masses.   Chest and Lung Exam Auscultation: Breath Sounds:- even and unlabored. Faint upper lobe rhonchi     Abdomen Inspection:-Inspection Normal.  Palpation/Perucssion: Palpation and Percussion of the abdomen reveal- Non Tender, No Rebound tenderness, No rigidity(Guarding) and No Palpable abdominal masses.  Liver:-Normal.  Spleen:- Normal.   Back- no cva pain.        Assessment & Plan:  You appear to have bronchitis. Rest hydrate and tylenol for fever. I am prescribing cough benzonatate  medicine, and doxycycline antibiotic.   For wheezing refill proair to use 2 inh every 6 hours if needed. If using proair frequently then start qvar steroid inhaler. If having to use both and still some wheezing on Tuesday then start  prednisone taper dose in light of upcoming son wedding.  You should gradually get better. If not then notify us and would recommend a chest xray.  For abdomen pain in recent past will get cbc, cmp, amylase and lipase today. Will place abdomen US order.   For anxiety and insmonia will rx limited number of xanax  For obesity discussed plan with Dr. Charlett Blake. If she approves will send in fax of phone. Later talked with Dr. Randel Pigg. Authorized belvique. Can use for 2 wks but needs to see Dr. Charlett Blake in 2 weeks to discuss

## 2016-01-27 NOTE — Telephone Encounter (Signed)
Pt last ate 9 am this morning. Order abd [name]. Can she get done today 3pm or so. Or can you schedule her for tomorrow. Order placed.

## 2016-01-27 NOTE — Progress Notes (Signed)
Pre visit review using our clinic tool,if applicable. No additional management support is needed unless otherwise documented below in the visit note.  

## 2016-01-27 NOTE — Telephone Encounter (Signed)
Sent to Imaging. They will call pt to schedule.

## 2016-01-27 NOTE — Patient Instructions (Addendum)
You appear to have bronchitis. Rest hydrate and tylenol for fever. I am prescribing cough benzonatate  medicine, and doxycycline antibiotic.   For wheezing refill proair to use 2 inh every 6 hours if needed. If using proair frequently then start qvar steroid inhaler. If having to use both and still some wheezing on Tuesday then start prednisone taper dose in light of upcoming son wedding.  You should gradually get better. If not then notify us and would recommend a chest xray.  For abdomen pain in recent past will get cbc, cmp, amylase and lipase today. Will place abdomen US order.   For anxiety and insmonia will rx limited number of xanax  For obesity discussed plan with Dr. Charlett Blake. If she approves will send in fax of phone. Later talked with Dr. Randel Pigg. Authorized belvique. Can use for 2 wks but needs to see Dr. Charlett Blake in 2 weeks to discuss  Follow up in 7-10 days or as needed

## 2016-01-27 NOTE — Telephone Encounter (Signed)
I talked with Dr Charlett Blake. For weight loss she approved Belvique/lorcaserin. She can start this and then follow up with Dr. Charlett Blake in 2 weeks. She can pick this up today. Not sure when we will close. Pick up today before 5 pm. Or on Tuesday. Let her now that this med might need insurance prior authorization.

## 2016-02-10 MED FILL — ATORVASTATIN 40 MG TABLET: 40 | 30 days supply | Qty: 30 | Fill #8

## 2016-02-10 MED FILL — LOSARTAN POTASSIUM 100 MG T: 100 | 30 days supply | Qty: 30 | Fill #3

## 2016-02-27 MED FILL — CITALOPRAM HBR 20 MG TABLET: 20 | 32 days supply | Qty: 32 | Fill #3

## 2016-03-09 ENCOUNTER — Other Ambulatory Visit: Payer: Self-pay | Admitting: Family Medicine

## 2016-03-09 MED ORDER — LOSARTAN POTASSIUM 100 MG PO TABS: ORAL_TABLET | ORAL | 2 refills | Status: DC

## 2016-03-09 MED FILL — LOSARTAN POTASSIUM 100 MG T: 100 | 10 days supply | Qty: 10 | Fill #4

## 2016-03-21 ENCOUNTER — Other Ambulatory Visit: Payer: Self-pay | Admitting: Family Medicine

## 2016-03-21 DIAGNOSIS — E785 Hyperlipidemia, unspecified: Secondary | ICD-10-CM

## 2016-03-21 MED FILL — LOSARTAN POTASSIUM 100 MG T: 100 | 30 days supply | Qty: 30 | Fill #0

## 2016-03-21 MED FILL — ATORVASTATIN 40 MG TABLET: 40 | 30 days supply | Qty: 30 | Fill #0 | Status: TO

## 2016-03-28 ENCOUNTER — Other Ambulatory Visit: Payer: Self-pay | Admitting: Family Medicine

## 2016-03-28 DIAGNOSIS — F329 Major depressive disorder, single episode, unspecified: Secondary | ICD-10-CM

## 2016-03-28 DIAGNOSIS — F419 Anxiety disorder, unspecified: Principal | ICD-10-CM

## 2016-03-28 MED FILL — CITALOPRAM HBR 20 MG TABLET: 20 | 30 days supply | Qty: 30 | Fill #0

## 2016-03-28 NOTE — Telephone Encounter (Signed)
Medication filled to pharmacy as requested.   

## 2016-03-29 MED FILL — QVAR 40 MCG ORAL INHALER: 40 | 30 days supply | Qty: 9 | Fill #0

## 2016-04-20 MED FILL — ATORVASTATIN 40 MG TABLET: 40 | 30 days supply | Qty: 30 | Fill #1 | Status: TO

## 2016-04-20 MED FILL — LOSARTAN POTASSIUM 100 MG T: 100 | 30 days supply | Qty: 30 | Fill #1

## 2016-04-27 MED FILL — CITALOPRAM HBR 20 MG TABLET: 20 | 30 days supply | Qty: 30 | Fill #1

## 2016-05-15 ENCOUNTER — Ambulatory Visit (INDEPENDENT_AMBULATORY_CARE_PROVIDER_SITE_OTHER): Payer: No Typology Code available for payment source | Admitting: Family Medicine

## 2016-05-15 ENCOUNTER — Other Ambulatory Visit (HOSPITAL_COMMUNITY)
Admission: RE | Admit: 2016-05-15 | Discharge: 2016-05-15 | Disposition: A | Discharge status: Home / Self Care | Payer: No Typology Code available for payment source | Source: Ambulatory Visit | Attending: Family Medicine | Admitting: Family Medicine

## 2016-05-15 ENCOUNTER — Encounter: Payer: Self-pay | Admitting: Family Medicine

## 2016-05-15 VITALS — BP 132/86 | HR 82 | Temp 97.9°F | Resp 16 | Ht 67.5 in | Wt 195.8 lb

## 2016-05-15 DIAGNOSIS — Z01419 Encounter for gynecological examination (general) (routine) without abnormal findings: Secondary | ICD-10-CM | POA: Insufficient documentation

## 2016-05-15 DIAGNOSIS — I10 Essential (primary) hypertension: Secondary | ICD-10-CM

## 2016-05-15 DIAGNOSIS — Z0000 Encounter for general adult medical examination without abnormal findings: Secondary | ICD-10-CM | POA: Diagnosis not present

## 2016-05-15 DIAGNOSIS — E782 Mixed hyperlipidemia: Secondary | ICD-10-CM

## 2016-05-15 DIAGNOSIS — Z1211 Encounter for screening for malignant neoplasm of colon: Secondary | ICD-10-CM | POA: Diagnosis not present

## 2016-05-15 DIAGNOSIS — R059 Cough, unspecified: Secondary | ICD-10-CM

## 2016-05-15 DIAGNOSIS — Z124 Encounter for screening for malignant neoplasm of cervix: Secondary | ICD-10-CM | POA: Diagnosis not present

## 2016-05-15 DIAGNOSIS — F329 Major depressive disorder, single episode, unspecified: Secondary | ICD-10-CM

## 2016-05-15 DIAGNOSIS — F419 Anxiety disorder, unspecified: Secondary | ICD-10-CM | POA: Diagnosis not present

## 2016-05-15 DIAGNOSIS — Z23 Encounter for immunization: Secondary | ICD-10-CM | POA: Diagnosis not present

## 2016-05-15 DIAGNOSIS — E663 Overweight: Secondary | ICD-10-CM

## 2016-05-15 DIAGNOSIS — R05 Cough: Secondary | ICD-10-CM

## 2016-05-15 LAB — COMPREHENSIVE METABOLIC PANEL
ALT: 30 U/L (ref 0–35)
AST: 30 U/L (ref 0–37)
Albumin: 4.5 g/dL (ref 3.5–5.2)
Alkaline Phosphatase: 60 U/L (ref 39–117)
BILIRUBIN TOTAL: 0.5 mg/dL (ref 0.2–1.2)
BUN: 11 mg/dL (ref 6–23)
CO2: 29 meq/L (ref 19–32)
CREATININE: 0.78 mg/dL (ref 0.40–1.20)
Calcium: 9.3 mg/dL (ref 8.4–10.5)
Chloride: 103 mEq/L (ref 96–112)
GFR: 79.77 mL/min (ref >60.00)
GLUCOSE: 86 mg/dL (ref 70–99)
Potassium: 4.3 mEq/L (ref 3.5–5.1)
SODIUM: 140 meq/L (ref 135–145)
Total Protein: 7.2 g/dL (ref 6.0–8.3)

## 2016-05-15 LAB — LIPID PANEL
CHOL/HDL RATIO: 2
Cholesterol: 173 mg/dL (ref 0–200)
HDL: 91.9 mg/dL (ref >39.00)
LDL CALC: 66 mg/dL (ref 0–99)
NonHDL: 81.11
TRIGLYCERIDES: 74 mg/dL (ref 0.0–149.0)
VLDL: 14.8 mg/dL (ref 0.0–40.0)

## 2016-05-15 LAB — CBC
HCT: 40.6 % (ref 36.0–46.0)
Hemoglobin: 13.6 g/dL (ref 12.0–15.0)
MCHC: 33.5 g/dL (ref 30.0–36.0)
MCV: 98.4 fl (ref 78.0–100.0)
Platelets: 219 10*3/uL (ref 150.0–400.0)
RBC: 4.13 Mil/uL (ref 3.87–5.11)
RDW: 15.1 % (ref 11.5–15.5)
WBC: 6.4 10*3/uL (ref 4.0–10.5)

## 2016-05-15 LAB — TSH: TSH: 0.55 u[IU]/mL (ref 0.35–4.50)

## 2016-05-15 MED ORDER — ALBUTEROL SULFATE HFA 108 (90 BASE) MCG/ACT IN AERS: 2.0000 | INHALATION_SPRAY | Freq: Four times a day (QID) | RESPIRATORY_TRACT | 3 refills | Status: DC | PRN

## 2016-05-15 MED ORDER — CITALOPRAM HYDROBROMIDE 20 MG PO TABS: 20.0000 mg | ORAL_TABLET | Freq: Every day | ORAL | 1 refills | Status: DC

## 2016-05-15 MED ORDER — ALPRAZOLAM 0.25 MG PO TABS: 0.2500 mg | ORAL_TABLET | Freq: Every evening | ORAL | 0 refills | Status: DC | PRN

## 2016-05-15 MED FILL — VENTOLIN HFA 90 MCG INHALER: 108 (90 BAS | 30 days supply | Qty: 18 | Fill #0

## 2016-05-15 MED FILL — LOSARTAN POTASSIUM 100 MG T: 100 | 30 days supply | Qty: 30 | Fill #2 | Status: TO

## 2016-05-15 MED FILL — ATORVASTATIN 40 MG TABLET: 40 | 30 days supply | Qty: 30 | Fill #2 | Status: TO

## 2016-05-15 MED FILL — ALPRAZolam 0.25 MG TABS: 0.25 | 10 days supply | Qty: 20 | Fill #0

## 2016-05-15 NOTE — Assessment & Plan Note (Signed)
Well controlled, no changes to meds. Encouraged heart healthy diet such as the DASH diet and exercise as tolerated.  °

## 2016-05-15 NOTE — Progress Notes (Signed)
Pre visit review using our clinic review tool, if applicable. No additional management support is needed unless otherwise documented below in the visit note. 

## 2016-05-15 NOTE — Assessment & Plan Note (Signed)
Tolerating statin, encouraged heart healthy diet, avoid trans fats, minimize simple carbs and saturated fats. Increase exercise as tolerated 

## 2016-05-15 NOTE — Patient Instructions (Addendum)
Please send mychart about weight management class Dr. Dennard Nip in about 1 month to about classes.  You can look her up on [employer].     Influenza (Flu) Vaccine (Inactivated or Recombinant): What You Need to Know 1. Why get vaccinated? Influenza ("flu") is a contagious disease that spreads around the [place] every year, usually between October and May. Flu is caused by influenza viruses, and is spread mainly by coughing, sneezing, and close contact. Anyone can get flu. Flu strikes suddenly and can last several days. Symptoms vary by age, but can include:  fever/chills  sore throat  muscle aches  fatigue  cough  headache  runny or stuffy nose Flu can also lead to pneumonia and blood infections, and cause diarrhea and seizures in children. If you have a medical condition, such as heart or lung disease, flu can make it worse. Flu is more dangerous for some people. Infants and young children, people 53 years of age and older, pregnant women, and people with certain health conditions or a weakened immune system are at greatest risk. Each year thousands of people in the [place] die from flu, and many more are hospitalized. Flu vaccine can:  keep you from getting flu,  make flu less severe if you do get it, and  keep you from spreading flu to your family and other people. 2. Inactivated and recombinant flu vaccines A dose of flu vaccine is recommended every flu season. Children 6 months through 50 years of age may need two doses during the same flu season. Everyone else needs only one dose each flu season. Some inactivated flu vaccines contain a very small amount of a mercury-based preservative called thimerosal. Studies have not shown thimerosal in vaccines to be harmful, but flu vaccines that do not contain thimerosal are available. There is no live flu virus in flu shots. They cannot cause the flu. There are many flu viruses, and they are always changing.  Each year a new flu vaccine is made to protect against three or four viruses that are likely to cause disease in the upcoming flu season. But even when the vaccine doesn't exactly match these viruses, it may still provide some protection. Flu vaccine cannot prevent:  flu that is caused by a virus not covered by the vaccine, or  illnesses that look like flu but are not. It takes about 2 weeks for protection to develop after vaccination, and protection lasts through the flu season. 3. Some people should not get this vaccine Tell the person who is giving you the vaccine:  If you have any severe, life-threatening allergies. If you ever had a life-threatening allergic reaction after a dose of flu vaccine, or have a severe allergy to any part of this vaccine, you may be advised not to get vaccinated. Most, but not all, types of flu vaccine contain a small amount of egg protein.  If you ever had Guillain-Barr Syndrome (also called GBS). Some people with a history of GBS should not get this vaccine. This should be discussed with your doctor.  If you are not feeling well. It is usually okay to get flu vaccine when you have a mild illness, but you might be asked to come back when you feel better. 4. Risks of a vaccine reaction With any medicine, including vaccines, there is a chance of reactions. These are usually mild and go away on their own, but serious reactions are also possible. Most people who get a flu shot  do not have any problems with it. Minor problems following a flu shot include:  soreness, redness, or swelling where the shot was given  hoarseness  sore, red or itchy eyes  cough  fever  aches  headache  itching  fatigue If these problems occur, they usually begin soon after the shot and last 1 or 2 days. More serious problems following a flu shot can include the following:  There may be a small increased risk of Guillain-Barre Syndrome (GBS) after inactivated flu vaccine.  This risk has been estimated at 1 or 2 additional cases per million people vaccinated. This is much lower than the risk of severe complications from flu, which can be prevented by flu vaccine.  Young children who get the flu shot along with pneumococcal vaccine (PCV13) and/or DTaP vaccine at the same time might be slightly more likely to have a seizure caused by fever. Ask your doctor for more information. Tell your doctor if a child who is getting flu vaccine has ever had a seizure. Problems that could happen after any injected vaccine:  People sometimes faint after a medical procedure, including vaccination. Sitting or lying down for about 15 minutes can help prevent fainting, and injuries caused by a fall. Tell your doctor if you feel dizzy, or have vision changes or ringing in the ears.  Some people get severe pain in the shoulder and have difficulty moving the arm where a shot was given. This happens very rarely.  Any medication can cause a severe allergic reaction. Such reactions from a vaccine are very rare, estimated at about 1 in a million doses, and would happen within a few minutes to a few hours after the vaccination. As with any medicine, there is a very remote chance of a vaccine causing a serious injury or death. The safety of vaccines is always being monitored. For more information, visit: http://www.aguilar.org/ 5. What if there is a serious reaction? What should I look for? Look for anything that concerns you, such as signs of a severe allergic reaction, very high fever, or unusual behavior. Signs of a severe allergic reaction can include hives, swelling of the face and throat, difficulty breathing, a fast heartbeat, dizziness, and weakness. These would start a few minutes to a few hours after the vaccination. What should I do?  If you think it is a severe allergic reaction or other emergency that can't wait, call 9-1-1 and get the person to the nearest hospital. Otherwise,  call your doctor.  Reactions should be reported to the Vaccine Adverse Event Reporting System (VAERS). Your doctor should file this report, or you can do it yourself through the VAERS web site at www.vaers.SamedayNews.es, or by calling 367-156-6077.  VAERS does not give medical advice. 6. The National Vaccine Injury Compensation Program The [employer] Vaccine Injury Compensation Program (VICP) is a federal program that was created to compensate people who may have been injured by certain vaccines. Persons who believe they may have been injured by a vaccine can learn about the program and about filing a claim by calling 858-481-7766 or visiting the [place] website at GoldCloset.com.ee. There is a time limit to file a claim for compensation. 7. How can I learn more?  Ask your healthcare provider. He or she can give you the vaccine package insert or suggest other sources of information.  Call your local or state health department.  Contact the Centers for Disease Control and Prevention (CDC):  Call 973-260-4681 (1-800-CDC-INFO) or  Visit CDC's website at  https://gibson.com/ Vaccine Information Statement, Inactivated Influenza Vaccine (01/01/2014) This information is not intended to replace advice given to you by your health care provider. Make sure you discuss any questions you have with your health care provider. Document Released: 03/08/2006 Document Revised: 02/02/2016 Document Reviewed: 02/02/2016 Elsevier Interactive Patient Education  2017 [employer].

## 2016-05-15 NOTE — Assessment & Plan Note (Signed)
Pap today, no concerns on exam.  

## 2016-05-15 NOTE — Assessment & Plan Note (Signed)
Encouraged DASH diet, decrease po intake and increase exercise as tolerated. Needs 7-8 hours of sleep nightly. Avoid trans fats, eat small, frequent meals every 4-5 hours with lean proteins, complex carbs and healthy fats. Minimize simple carbs 

## 2016-05-15 NOTE — Progress Notes (Signed)
Subjective:    Patient ID: Latoya [name], female    DOB: [birth year], 60 y.o.   MRN: [id number]  Chief Complaint  Patient presents with  . Annual Exam    with pap    HPI Patient is in today for annual exam.  Has some some productive cough, nasal congestion, and chest congestion. No fevers or chills and symptoms are mild. No other acute symptoms or illness. She is frustrated with her weight and and it is questioning whether a medication such as Belvig could be helpful. Is doing well with ADLs. Try to maintain a heart healthy diet. Denies CP/palp/SOB/HA/congestion/fevers/GI or GU c/o. Taking meds as prescribed  Past Medical History:  Diagnosis Date  . Anxiety   . Cancer ([place]) 12/2009   BCC, SCC  . Cervical cancer screening 12/30/2012  . Dermatitis 12/03/2007   Qualifier: Diagnosis of  By: Jerold Coombe    . Eczema 01/04/2013  . Hyperlipidemia   . Hypertension   . Morbid obesity ([place]) 01/13/2012  . Overweight 01/13/2012  . Pain of toe of left foot 08/23/2014  . Preventative health care 01/04/2013    Past Surgical History:  Procedure Laterality Date  . dilation and currettage    . MOHS SURGERY  12/2009   basal cell     Family History  Problem Relation Age of Onset  . Heart disease Mother   . Diabetes Mother   . Heart disease Father     pacer  . Diabetes Father   . Hypertension Father     weight controlled  . Obesity Father   . Obesity Sister     s/p lap band  . Cancer Maternal Grandmother     pancreatic  . Cancer Paternal Grandmother 1    liver  . Psoriasis Brother   . Obesity Brother   . Obesity Brother   . Diabetes Brother     diet controlled  . Melanoma      Social History   Social History  . Marital status: Single    Spouse name: N/A  . Number of children: N/A  . Years of education: N/A   Occupational History  . 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 Smoking status: Former Research scientist (life sciences)  . Smokeless tobacco: Never Used  .  Alcohol use Yes  . Drug use: No  . Sexual activity: Not on file     Comment: lives ALONE. no dietary restrictions, works full time Insurance underwriter   Other Topics Concern  . Not on file   Social History Narrative  . No narrative on file    Outpatient Medications Prior to Visit  Medication Sig Dispense Refill  . aspirin 81 MG tablet Take 81 mg by mouth daily. Reported on 06/14/2015    . atorvastatin (LIPITOR) 40 MG tablet TAKE 1 TABLET (40 MG TOTAL) BY MOUTH DAILY AT 6 PM. 90 tablet 3  . beclomethasone (QVAR) 40 MCG/ACT inhaler Inhale 2 puffs into the lungs 2 (two) times daily at 10 AM and 5 PM. 1 Inhaler 2  . cetirizine (ZYRTEC) 5 MG tablet Take 5 mg by mouth daily.    . Lorcaserin HCl 10 MG TABS 1 tab po twice daily 28 tablet 0  . losartan (COZAAR) 100 MG tablet TAKE 1 TABLET (100 MG) BY MOUTH DAILY. 90 tablet 2  . ALPRAZolam (XANAX) 0.25 MG tablet Take 1-2 tablets (0.25-0.5 mg total) by mouth at bedtime as needed for sleep or anxiety. 20 tablet 0  .  citalopram (CELEXA) 20 MG tablet TAKE 1 TABLET BY MOUTH DAILY 90 tablet 0  . atorvastatin (LIPITOR) 40 MG tablet Take 1 tablet (40 mg total) by mouth daily at 6 PM. 90 tablet 3  . doxycycline (VIBRA-TABS) 100 MG tablet Take 1 tablet (100 mg total) by mouth 2 (two) times daily. 14 tablet 0  . hydrOXYzine (ATARAX/VISTARIL) 25 MG tablet Take 1 tablet (25 mg total) by mouth 3 (three) times daily as needed. (Patient not taking: Reported on 01/27/2016) 30 tablet 0  . phentermine 15 MG capsule Take 1 capsule (15 mg total) by mouth every morning. (Patient not taking: Reported on 01/27/2016) 30 capsule 2  . predniSONE (DELTASONE) 10 MG tablet 6 tab po day 1, 5 tab po day 2, 4 tab po day 3, 3 tab po day 4. 2 tab po day 5, and 1 tab po day 6. 21 tablet 0  . PROAIR HFA 108 (90 Base) MCG/ACT inhaler Inhale 2 puffs into the lungs every 6 (six) hours as needed. 18 g 0   No facility-administered medications prior to visit.     No Known Allergies  Review of  Systems  Constitutional: Negative for fever.  Eyes: Negative for blurred vision.  Respiratory: Positive for cough. Negative for shortness of breath.        Nasal and chest congestion  Cardiovascular: Negative for chest pain and palpitations.  Gastrointestinal: Negative for vomiting.  Musculoskeletal: Negative for back pain.  Skin: Negative for rash.  Neurological: Negative for loss of consciousness and headaches.       Objective:    Physical Exam  Constitutional: She is oriented to person, place, and time. She appears well-developed and well-nourished. No distress.  HENT:  Head: Normocephalic and atraumatic.  Eyes: Conjunctivae are normal.  Neck: Normal range of motion. No thyromegaly present.  Cardiovascular: Normal rate and regular rhythm.   Pulmonary/Chest: Effort normal and breath sounds normal. She has no wheezes.  Abdominal: Soft. Bowel sounds are normal. There is no tenderness.  Genitourinary: Vagina normal and uterus normal. No vaginal discharge found.  Musculoskeletal: Normal range of motion. She exhibits no edema or deformity.  Neurological: She is alert and oriented to person, place, and time.  Skin: Skin is warm and dry. She is not diaphoretic.  Psychiatric: She has a normal mood and affect.    BP 132/86 (BP Location: Left Arm, Cuff Size: Normal)   Pulse 82   Temp 97.9 F (36.6 C) (Oral)   Resp 16   Ht 5' 7.5" (1.715 m)   Wt 195 lb 12.8 oz (88.8 kg)   SpO2 97%   BMI 30.21 kg/m  Wt Readings from Last 3 Encounters:  05/15/16 195 lb 12.8 oz (88.8 kg)  01/27/16 199 lb 6.4 oz (90.4 kg)  10/27/15 192 lb 12.8 oz (87.5 kg)     Lab Results  Component Value Date   WBC 6.4 05/15/2016   HGB 13.6 05/15/2016   HCT 40.6 05/15/2016   PLT 219.0 05/15/2016   GLUCOSE 86 05/15/2016   CHOL 173 05/15/2016   TRIG 74.0 05/15/2016   HDL 91.90 05/15/2016   LDLDIRECT 144.9 12/23/2008   LDLCALC 66 05/15/2016   ALT 30 05/15/2016   AST 30 05/15/2016   NA 140 05/15/2016    K 4.3 05/15/2016   CL 103 05/15/2016   CREATININE 0.78 05/15/2016   BUN 11 05/15/2016   CO2 29 05/15/2016   TSH 0.55 05/15/2016   HGBA1C 5.6 09/26/2009    Lab Results  Component Value Date   TSH 0.55 05/15/2016   Lab Results  Component Value Date   WBC 6.4 05/15/2016   HGB 13.6 05/15/2016   HCT 40.6 05/15/2016   MCV 98.4 05/15/2016   PLT 219.0 05/15/2016   Lab Results  Component Value Date   NA 140 05/15/2016   K 4.3 05/15/2016   CO2 29 05/15/2016   GLUCOSE 86 05/15/2016   BUN 11 05/15/2016   CREATININE 0.78 05/15/2016   BILITOT 0.5 05/15/2016   ALKPHOS 60 05/15/2016   AST 30 05/15/2016   ALT 30 05/15/2016   PROT 7.2 05/15/2016   ALBUMIN 4.5 05/15/2016   CALCIUM 9.3 05/15/2016   GFR 79.77 05/15/2016   Lab Results  Component Value Date   CHOL 173 05/15/2016   Lab Results  Component Value Date   HDL 91.90 05/15/2016   Lab Results  Component Value Date   LDLCALC 66 05/15/2016   Lab Results  Component Value Date   TRIG 74.0 05/15/2016   Lab Results  Component Value Date   CHOLHDL 2 05/15/2016   Lab Results  Component Value Date   HGBA1C 5.6 09/26/2009       Assessment & Plan:   Problem List Items Addressed This Visit    Hyperlipidemia, mixed    Tolerating statin, encouraged heart healthy diet, avoid trans fats, minimize simple carbs and saturated fats. Increase exercise as tolerated      Relevant Orders   Lipid panel (Completed)   Essential hypertension    Well controlled, no changes to meds. Encouraged heart healthy diet such as the DASH diet and exercise as tolerated.       Relevant Orders   Comprehensive metabolic panel (Completed)   TSH (Completed)   CBC (Completed)   Overweight    Encouraged DASH diet, decrease po intake and increase exercise as tolerated. Needs 7-8 hours of sleep nightly. Avoid trans fats, eat small, frequent meals every 4-5 hours with lean proteins, complex carbs and healthy fats. Minimize simple carbs.       Cervical cancer screening    Pap today, no concerns on exam.       Relevant Orders   Cytology - PAP (Completed)   Ambulatory referral to Gastroenterology   Preventative health care    Patient encouraged to maintain heart healthy diet, regular exercise, adequate sleep. Consider daily probiotics. Take medications as prescribed. Given and reviewed copy of ACP documents from [employer] and encouraged to complete and return. Given flu shot. Referred for screening colonoscopy       Anxiety   Relevant Medications   citalopram (CELEXA) 20 MG tablet   ALPRAZolam (XANAX) 0.25 MG tablet   Other Relevant Orders   TSH (Completed)   Cough    Has been on and off since Sepetember. Was treated with antibiotic and steroids and improved and now has worsened in am again, has some grey sputum produced in am then improves.      Relevant Medications   albuterol (VENTOLIN HFA) 108 (90 Base) MCG/ACT inhaler    Other Visit Diagnoses    Screening for colon cancer    -  Primary   Anxiety and depression       Relevant Medications   citalopram (CELEXA) 20 MG tablet   Encounter for immunization       Relevant Orders   Flu Vaccine QUAD 36+ mos IM (Completed)      I have discontinued Ms. Condrey's phentermine, hydrOXYzine, PROAIR HFA, predniSONE, and doxycycline.  I have also changed her citalopram. Additionally, I am having her maintain her aspirin, cetirizine, beclomethasone, Lorcaserin HCl, losartan, atorvastatin, ALPRAZolam, and albuterol.  Meds ordered this encounter  Medications  . DISCONTD: albuterol (VENTOLIN HFA) 108 (90 Base) MCG/ACT inhaler    Sig: Inhale 2 puffs into the lungs every 6 (six) hours as needed for wheezing or shortness of breath.  . citalopram (CELEXA) 20 MG tablet    Sig: Take 1 tablet (20 mg total) by mouth daily.    Dispense:  90 tablet    Refill:  1  . ALPRAZolam (XANAX) 0.25 MG tablet    Sig: Take 1-2 tablets (0.25-0.5 mg total) by mouth at bedtime as needed for  sleep or anxiety.    Dispense:  20 tablet    Refill:  0  . albuterol (VENTOLIN HFA) 108 (90 Base) MCG/ACT inhaler    Sig: Inhale 2 puffs into the lungs every 6 (six) hours as needed for wheezing or shortness of breath.    Dispense:  1 Inhaler    Refill:  3   CMA served as scribe during this visit. History, Physical and Plan performed by medical provider. Documentation and orders reviewed and attested to.   Penni Homans, MD

## 2016-05-15 NOTE — Assessment & Plan Note (Signed)
Has been on and off since [place]. Was treated with antibiotic and steroids and improved and now has worsened in am again, has some grey sputum produced in am then improves.

## 2016-05-16 LAB — CYTOLOGY - PAP: Diagnosis: NEGATIVE

## 2016-05-17 ENCOUNTER — Encounter: Payer: Self-pay | Admitting: Internal Medicine

## 2016-05-30 NOTE — Assessment & Plan Note (Addendum)
Patient encouraged to maintain heart healthy diet, regular exercise, adequate sleep. Consider daily probiotics. Take medications as prescribed. Given and reviewed copy of ACP documents from [employer] and encouraged to complete and return. Given flu shot. Referred for screening colonoscopy

## 2016-06-01 MED FILL — CITALOPRAM HBR 20 MG TABLET: 20 | 30 days supply | Qty: 30 | Fill #2

## 2016-07-02 ENCOUNTER — Other Ambulatory Visit: Payer: Self-pay | Admitting: Family Medicine

## 2016-07-02 DIAGNOSIS — F32A Depression, unspecified: Secondary | ICD-10-CM

## 2016-07-02 DIAGNOSIS — F419 Anxiety disorder, unspecified: Principal | ICD-10-CM

## 2016-07-02 DIAGNOSIS — F329 Major depressive disorder, single episode, unspecified: Secondary | ICD-10-CM

## 2016-07-02 MED FILL — CITALOPRAM HBR 20 MG TABLET: 20 | 30 days supply | Qty: 30 | Fill #0

## 2016-07-03 ENCOUNTER — Encounter (INDEPENDENT_AMBULATORY_CARE_PROVIDER_SITE_OTHER): Payer: Self-pay | Admitting: Family Medicine

## 2016-07-04 ENCOUNTER — Ambulatory Visit (AMBULATORY_SURGERY_CENTER): Payer: Self-pay

## 2016-07-04 ENCOUNTER — Ambulatory Visit (INDEPENDENT_AMBULATORY_CARE_PROVIDER_SITE_OTHER): Payer: No Typology Code available for payment source | Admitting: Family Medicine

## 2016-07-04 ENCOUNTER — Encounter (INDEPENDENT_AMBULATORY_CARE_PROVIDER_SITE_OTHER): Payer: Self-pay | Admitting: Family Medicine

## 2016-07-04 VITALS — BP 136/88 | HR 70 | Temp 97.7°F | Resp 19 | Ht 67.0 in | Wt 192.0 lb

## 2016-07-04 VITALS — Ht 67.5 in | Wt 192.0 lb

## 2016-07-04 DIAGNOSIS — I10 Essential (primary) hypertension: Secondary | ICD-10-CM

## 2016-07-04 DIAGNOSIS — E669 Obesity, unspecified: Secondary | ICD-10-CM

## 2016-07-04 DIAGNOSIS — R5383 Other fatigue: Secondary | ICD-10-CM | POA: Diagnosis not present

## 2016-07-04 DIAGNOSIS — Z6830 Body mass index (BMI) 30.0-30.9, adult: Secondary | ICD-10-CM

## 2016-07-04 DIAGNOSIS — Z1211 Encounter for screening for malignant neoplasm of colon: Secondary | ICD-10-CM

## 2016-07-04 DIAGNOSIS — R0602 Shortness of breath: Secondary | ICD-10-CM | POA: Diagnosis not present

## 2016-07-04 DIAGNOSIS — Z1389 Encounter for screening for other disorder: Secondary | ICD-10-CM | POA: Diagnosis not present

## 2016-07-04 DIAGNOSIS — E785 Hyperlipidemia, unspecified: Secondary | ICD-10-CM

## 2016-07-04 DIAGNOSIS — Z9189 Other specified personal risk factors, not elsewhere classified: Secondary | ICD-10-CM

## 2016-07-04 DIAGNOSIS — Z0289 Encounter for other administrative examinations: Secondary | ICD-10-CM

## 2016-07-04 DIAGNOSIS — Z1331 Encounter for screening for depression: Secondary | ICD-10-CM

## 2016-07-04 MED ORDER — SUPREP BOWEL PREP KIT 17.5-3.13-1.6 GM/177ML PO SOLN: 1.0000 | Freq: Once | ORAL | 0 refills | Status: AC

## 2016-07-04 MED FILL — SUPREP BOWEL PREP KIT: 17.5-3.13-1 | 1 days supply | Qty: 354 | Fill #0

## 2016-07-04 NOTE — Progress Notes (Signed)
Office: [phone]  /  Fax: [phone]   HPI:   Chief Complaint: OBESITY  Latoya [name] (MR# [id number]) is a 61 y.o. female who presents on 07/04/2016 for obesity evaluation and treatment. Current BMI is Body mass index is 30.07 kg/m.Latoya [name] Kitchen Latoya [name] has struggled with obesity for years and has been unsuccessful in either losing weight or maintaining long term weight loss. Latoya [name] attended our information session and states she is currently in the action stage of change and ready to dedicate time achieving and maintaining a healthier weight.  Latoya [name] states her desired weight is 165 she started gaining weight in Spring of 2017 her heaviest weight ever was 210 lbs. she has significant food cravings issues  she snacks frequently in the evenings she skips meals frequently she is frequently drinking liquids with calories she frequently makes poor food choices she has problems with excessive hunger  she has binge eating behaviors   Fatigue Latoya [name] feels her energy is lower than it should be. This has worsened with weight gain and has not worsened recently. Latoya [name] admits to daytime somnolence and  denies waking up still tired. Patient is at risk for obstructive sleep apnea. Patent has a history of symptoms of daytime fatigue and hypertension. Patient generally gets 8 hours of sleep per night, and states they generally have generally restful sleep. Snoring is present. Apneic episodes are not present. Epworth Sleepiness Score is 6  Dyspnea on exertion Latoya [name] notes increasing shortness of breath with exercising and seems to be worsening over time with weight gain. She notes getting out of breath sooner with activity than she used to. This has not gotten worse recently. Latoya [name] denies orthopnea.  Hypertension Latoya [name] is a 61 y.o. female with hypertension. She is currently stable on medications and EKG is normal. Latoya [name] denies chest pain, headache or shortness of breath on exertion. She is working weight loss to help  control her blood pressure with the goal of decreasing her risk of heart attack and stroke. Anns blood pressure is not currently controlled.  Hyperlipidemia Latoya [name] has hyperlipidemia and has been trying to improve her cholesterol levels with intensive lifestyle modification including a low saturated fat diet, exercise and weight loss. She denies any chest pain, claudication or myalgias.  At risk for cardiovascular disease Latoya [name] is at a higher than average risk for cardiovascular disease due to obesity. She currently denies any chest pain.  Depression Screen Latoya [name]'s Food and Mood (modified PHQ-9) score was  Depression screen PHQ 2/9 07/04/2016  Decreased Interest 1  Down, Depressed, Hopeless 1  PHQ - 2 Score 2  Altered sleeping 0  Tired, decreased energy 1  Change in appetite 1  Feeling bad or failure about yourself  0  Trouble concentrating 0  Moving slowly or fidgety/restless 0  Suicidal thoughts 0  PHQ-9 Score 4    ALLERGIES: Allergies  Allergen Reactions  . Monosodium Glutamate Hives    Cashew Chicken    MEDICATIONS: Current Outpatient Prescriptions on File Prior to Visit  Medication Sig Dispense Refill  . albuterol (VENTOLIN HFA) 108 (90 Base) MCG/ACT inhaler Inhale 2 puffs into the lungs every 6 (six) hours as needed for wheezing or shortness of breath. 1 Inhaler 3  . ALPRAZolam (XANAX) 0.25 MG tablet Take 1-2 tablets (0.25-0.5 mg total) by mouth at bedtime as needed for sleep or anxiety. 20 tablet 0  . atorvastatin (LIPITOR) 40 MG tablet TAKE 1 TABLET (40 MG TOTAL) BY MOUTH DAILY AT 6 PM. 90 tablet 3  .  beclomethasone (QVAR) 40 MCG/ACT inhaler Inhale 2 puffs into the lungs 2 (two) times daily at 10 AM and 5 PM. 1 Inhaler 2  . cetirizine (ZYRTEC) 5 MG tablet Take 5 mg by mouth daily.    . citalopram (CELEXA) 20 MG tablet Take 1 tablet (20 mg total) by mouth daily. 90 tablet 1  . losartan (COZAAR) 100 MG tablet TAKE 1 TABLET (100 MG) BY MOUTH DAILY. 90 tablet 2   No current  facility-administered medications on file prior to visit.     PAST MEDICAL HISTORY: Past Medical History:  Diagnosis Date  . Anxiety   . Back pain   . Cancer ([place]) 12/2009   BCC, SCC  . Cervical cancer screening 12/30/2012  . Dermatitis 12/03/2007   Qualifier: Diagnosis of  By: Jerold Coombe    . Eczema 01/04/2013  . Environmental allergies   . Fatty liver   . Hyperlipidemia   . Hypertension   . Morbid obesity ([place]) 01/13/2012  . Multiple food allergies    cashew chicken, chinese, msg?  . Overweight 01/13/2012  . Pain of toe of left foot 08/23/2014  . Preventative health care 01/04/2013  . Vitamin D deficiency     PAST SURGICAL HISTORY: Past Surgical History:  Procedure Laterality Date  . dilation and currettage     lifestyle lift  . MOHS SURGERY  12/2009   basal cell     SOCIAL HISTORY: Social History  Substance Use Topics  . Smoking status: Former Research scientist (life sciences)  . Smokeless tobacco: Never Used  . Alcohol use Yes    FAMILY HISTORY: Family History  Problem Relation Age of Onset  . Heart disease Mother   . Diabetes Mother   . Hypertension Mother   . Hyperlipidemia Mother   . Heart Problems Mother   . Heart disease Father     pacer  . Diabetes Father   . Hypertension Father     weight controlled  . Obesity Father   . Hyperlipidemia Father   . Heart Problems Father   . Cancer Father   . Obesity Sister     s/p lap band  . Cancer Maternal Grandmother     pancreatic  . Cancer Paternal Grandmother 68    liver  . Psoriasis Brother   . Obesity Brother   . Obesity Brother   . Diabetes Brother     diet controlled  . Melanoma      ROS: Review of Systems  HENT:       Nasal Stuffiness Nasal Discharge  Respiratory: Positive for cough and wheezing. Negative for shortness of breath.   Cardiovascular: Negative for chest pain, orthopnea and claudication.  Musculoskeletal: Positive for back pain. Negative for myalgias.  Skin: Positive for itching and rash.        Dryness  Neurological: Negative for headaches.    PHYSICAL EXAM: Blood pressure 136/88, pulse 70, temperature 97.7 F (36.5 C), temperature source Oral, resp. rate 19, height 5\' 7"  (1.702 m), weight 192 lb (87.1 kg), SpO2 96 %. Body mass index is 30.07 kg/m. Physical Exam  Constitutional: She is oriented to person, place, and time. She appears well-developed and well-nourished.  Cardiovascular: Normal rate.   Pulmonary/Chest: Effort normal.  Musculoskeletal: Normal range of motion.  Neurological: She is oriented to person, place, and time.  Skin: Skin is warm and dry.  Psychiatric: She has a normal mood and affect. Her behavior is normal.  Vitals reviewed.   RECENT LABS AND TESTS: BMET  Component Value Date/Time   NA 140 05/15/2016 1159   K 4.3 05/15/2016 1159   CL 103 05/15/2016 1159   CO2 29 05/15/2016 1159   GLUCOSE 86 05/15/2016 1159   BUN 11 05/15/2016 1159   CREATININE 0.78 05/15/2016 1159   CREATININE 0.92 12/30/2012 0816   CALCIUM 9.3 05/15/2016 1159   GFRNONAA 79.30 09/26/2009 0000   GFRAA 96 07/19/2008 0957   Lab Results  Component Value Date   HGBA1C 5.6 09/26/2009   No results found for: INSULIN CBC    Component Value Date/Time   WBC 6.4 05/15/2016 1159   RBC 4.13 05/15/2016 1159   HGB 13.6 05/15/2016 1159   HCT 40.6 05/15/2016 1159   PLT 219.0 05/15/2016 1159   MCV 98.4 05/15/2016 1159   MCH 33.1 12/30/2012 0816   MCHC 33.5 05/15/2016 1159   RDW 15.1 05/15/2016 1159   LYMPHSABS 1.7 01/27/2016 1055   MONOABS 0.5 01/27/2016 1055   EOSABS 0.6 01/27/2016 1055   BASOSABS 0.1 01/27/2016 1055   Iron/TIBC/Ferritin/ %Sat No results found for: IRON, TIBC, FERRITIN, IRONPCTSAT Lipid Panel     Component Value Date/Time   CHOL 173 05/15/2016 1159   TRIG 74.0 05/15/2016 1159   HDL 91.90 05/15/2016 1159   CHOLHDL 2 05/15/2016 1159   VLDL 14.8 05/15/2016 1159   LDLCALC 66 05/15/2016 1159   LDLDIRECT 144.9 12/23/2008 0932   Hepatic Function Panel       Component Value Date/Time   PROT 7.2 05/15/2016 1159   ALBUMIN 4.5 05/15/2016 1159   AST 30 05/15/2016 1159   ALT 30 05/15/2016 1159   ALKPHOS 60 05/15/2016 1159   BILITOT 0.5 05/15/2016 1159   BILIDIR 0.0 02/08/2014 0831   IBILI 0.5 12/30/2012 0816      Component Value Date/Time   TSH 0.55 05/15/2016 1159   TSH 0.73 06/14/2015 1225   TSH 0.88 02/15/2015 1215    ECG  shows NSR with a rate of 70 BPM  INDIRECT CALORIMETER done today shows a VO2 of 257 and a REE of 1786.    ASSESSMENT AND PLAN: Other fatigue - Plan: EKG 12-Lead, CBC With Differential, Vitamin B12, Folate, Comprehensive metabolic panel, Hemoglobin A1c, Insulin, random, T3, T4, free, TSH, VITAMIN D 25 Hydroxy (Vit-D Deficiency, Fractures)  Shortness of breath on exertion  Essential hypertension  Hyperlipidemia, unspecified hyperlipidemia type - Plan: Lipid Panel With LDL/HDL Ratio  Depression screening  At risk for heart disease  Class 1 obesity without serious comorbidity with body mass index (BMI) of 30.0 to 30.9 in adult, unspecified obesity type  PLAN:  Fatigue Latoya [name] was informed that her fatigue may be related to obesity, depression or many other causes. Labs will be ordered, and in the meanwhile Latoya [name] has agreed to work on diet, exercise and weight loss to help with fatigue. Proper sleep hygiene was discussed including the need for 7-8 hours of quality sleep each night. A sleep study was not ordered based on symptoms and Epworth score.  Dyspnea on exertion Latoya [name]'s shortness of breath appears to be obesity related and exercise induced. She has agreed to work on weight loss and gradually increase exercise to treat her exercise induced shortness of breath. If Latoya [name] follows our instructions and loses weight without improvement of her shortness of breath, we will plan to refer to pulmonology. We will monitor this condition regularly. Latoya [name] agrees to this plan.  Hypertension We discussed sodium restriction,  working on healthy weight loss, and a regular exercise program as the means  to achieve improved blood pressure control. Latoya [name] agreed with this plan and agreed to follow up as directed. We will continue to monitor her blood pressure as well as her progress with the above lifestyle modifications. She will continue her medications as prescribed and will watch for signs of hypotension as she continues her lifestyle modifications.  Hyperlipidemia Latoya [name] was informed of the American Heart Association Guidelines emphasizing intensive lifestyle modifications as the first line treatment for hyperlipidemia. We discussed many lifestyle modifications today in depth, and Latoya [name] will continue to work on decreasing saturated fats such as fatty red meat, butter and many fried foods. She will also increase vegetables and lean protein in her diet and continue to work on exercise and weight loss efforts. We will re-check labs and follow up at next visit in 2 weeks.  Cardiovascular risk counselling Latoya [name] was given extended (at least 15 minutes) coronary artery disease prevention counseling today. She is 61 y.o. female and has risk factors for heart disease including obesity. We discussed intensive lifestyle modifications today with an emphasis on specific weight loss instructions and strategies. Pt was also informed of the importance of increasing exercise and decreasing saturated fats to help prevent heart disease.  Depression Screen Latoya [name] had a positive depression screening. Depression is commonly associated with obesity and often results in emotional eating behaviors. We will monitor this closely and work on CBT to help improve the non-hunger eating patterns. Referral to Psychology may be required if no improvement is seen as she continues in our clinic.  Obesity Latoya [name] is currently in the action stage of change and her goal is to continue with weight loss efforts She has agreed to follow the Category 2 plan +100 calories. Latoya [name] has  been instructed to work up to a goal of 150 minutes of combined cardio and strengthening exercise per week for weight loss and overall health benefits. We discussed the following Behavioral Modification Stratagies today: increasing lean protein intake, decreasing simple carbohydrates  and increasing vegetables  Latoya [name] has agreed to follow up with our clinic in 2 weeks. She was informed of the importance of frequent follow up visits to maximize her success with intensive lifestyle modifications for her multiple health conditions. She was informed we would discuss her lab results at her next visit unless there is a critical issue that needs to be addressed sooner. Tovah agreed to keep her next visit at the agreed upon time to discuss these results.  I, Doreene Nest, am acting as scribe for Dennard Nip, MD  I have reviewed the above documentation for accuracy and completeness, and I agree with the above. -Dennard Nip, MD

## 2016-07-05 LAB — CBC WITH DIFFERENTIAL
BASOS: 1 %
Basophils Absolute: 0.1 10*3/uL (ref 0.0–0.2)
EOS (ABSOLUTE): 0.6 10*3/uL — ABNORMAL HIGH (ref 0.0–0.4)
Eos: 11 %
Hematocrit: 42.3 % (ref 34.0–46.6)
Hemoglobin: 13.5 g/dL (ref 11.1–15.9)
Immature Grans (Abs): 0 10*3/uL (ref 0.0–0.1)
Immature Granulocytes: 0 %
Lymphocytes Absolute: 1.5 10*3/uL (ref 0.7–3.1)
Lymphs: 30 %
MCH: 32.8 pg (ref 26.6–33.0)
MCHC: 31.9 g/dL (ref 31.5–35.7)
MCV: 103 fL — AB (ref 79–97)
MONOS ABS: 0.4 10*3/uL (ref 0.1–0.9)
Monocytes: 8 %
NEUTROS ABS: 2.5 10*3/uL (ref 1.4–7.0)
NEUTROS PCT: 50 %
RBC: 4.11 x10E6/uL (ref 3.77–5.28)
RDW: 15.8 % — AB (ref 12.3–15.4)
WBC: 5 10*3/uL (ref 3.4–10.8)

## 2016-07-05 LAB — COMPREHENSIVE METABOLIC PANEL
A/G RATIO: 1.9 (ref 1.2–2.2)
ALBUMIN: 4.9 g/dL — AB (ref 3.6–4.8)
ALK PHOS: 71 IU/L (ref 39–117)
ALT: 40 IU/L — ABNORMAL HIGH (ref 0–32)
AST: 44 IU/L — ABNORMAL HIGH (ref 0–40)
BILIRUBIN TOTAL: 0.5 mg/dL (ref 0.0–1.2)
BUN / CREAT RATIO: 17 (ref 12–28)
BUN: 12 mg/dL (ref 8–27)
CHLORIDE: 99 mmol/L (ref 96–106)
CO2: 25 mmol/L (ref 18–29)
CREATININE: 0.72 mg/dL (ref 0.57–1.00)
Calcium: 9.6 mg/dL (ref 8.7–10.3)
GFR calc Af Amer: 105 mL/min/{1.73_m2} (ref >59)
GFR calc non Af Amer: 91 mL/min/{1.73_m2} (ref >59)
GLOBULIN, TOTAL: 2.6 g/dL (ref 1.5–4.5)
Glucose: 88 mg/dL (ref 65–99)
Potassium: 5.2 mmol/L (ref 3.5–5.2)
SODIUM: 141 mmol/L (ref 134–144)
Total Protein: 7.5 g/dL (ref 6.0–8.5)

## 2016-07-05 LAB — TSH: TSH: 1.04 u[IU]/mL (ref 0.450–4.500)

## 2016-07-05 LAB — T3: T3, Total: 128 ng/dL (ref 71–180)

## 2016-07-05 LAB — T4, FREE: Free T4: 1.01 ng/dL (ref 0.82–1.77)

## 2016-07-05 LAB — VITAMIN D 25 HYDROXY (VIT D DEFICIENCY, FRACTURES): Vit D, 25-Hydroxy: 15.5 ng/mL — ABNORMAL LOW (ref 30.0–100.0)

## 2016-07-05 LAB — HEMOGLOBIN A1C
Est. average glucose Bld gHb Est-mCnc: 108 mg/dL
HEMOGLOBIN A1C: 5.4 % (ref 4.8–5.6)

## 2016-07-05 LAB — LIPID PANEL WITH LDL/HDL RATIO
Cholesterol, Total: 188 mg/dL (ref 100–199)
HDL: 100 mg/dL (ref >39)
LDL CALC: 76 mg/dL (ref 0–99)
LDL/HDL RATIO: 0.8 ratio (ref 0.0–3.2)
TRIGLYCERIDES: 61 mg/dL (ref 0–149)
VLDL Cholesterol Cal: 12 mg/dL (ref 5–40)

## 2016-07-05 LAB — FOLATE: Folate: 8.3 ng/mL (ref >3.0)

## 2016-07-05 LAB — VITAMIN B12: Vitamin B-12: 690 pg/mL (ref 232–1245)

## 2016-07-05 LAB — INSULIN, RANDOM: INSULIN: 8.9 u[IU]/mL (ref 2.6–24.9)

## 2016-07-17 ENCOUNTER — Ambulatory Visit (INDEPENDENT_AMBULATORY_CARE_PROVIDER_SITE_OTHER): Payer: No Typology Code available for payment source | Admitting: Family Medicine

## 2016-07-17 VITALS — BP 120/79 | HR 67 | Temp 97.8°F | Ht 67.0 in | Wt 187.0 lb

## 2016-07-17 DIAGNOSIS — Z9189 Other specified personal risk factors, not elsewhere classified: Secondary | ICD-10-CM

## 2016-07-17 DIAGNOSIS — Z6830 Body mass index (BMI) 30.0-30.9, adult: Secondary | ICD-10-CM | POA: Diagnosis not present

## 2016-07-17 DIAGNOSIS — E8881 Metabolic syndrome: Secondary | ICD-10-CM | POA: Insufficient documentation

## 2016-07-17 DIAGNOSIS — E669 Obesity, unspecified: Secondary | ICD-10-CM

## 2016-07-17 DIAGNOSIS — E559 Vitamin D deficiency, unspecified: Secondary | ICD-10-CM | POA: Diagnosis not present

## 2016-07-17 MED ORDER — VITAMIN D (ERGOCALCIFEROL) 1.25 MG (50000 UNIT) PO CAPS: 50000.0000 [IU] | ORAL_CAPSULE | ORAL | 0 refills | Status: DC

## 2016-07-17 MED FILL — LOSARTAN POTASSIUM 100 MG T: 100 | 30 days supply | Qty: 30 | Fill #0

## 2016-07-17 MED FILL — VIT D2 1.25 MG (50,000 UNIT: 1.25 MG | 28 days supply | Qty: 4 | Fill #0

## 2016-07-17 MED FILL — ATORVASTATIN 40 MG TABLET: 40 | 30 days supply | Qty: 30 | Fill #3 | Status: TO

## 2016-07-17 NOTE — Progress Notes (Signed)
Office: [phone]  /  Fax: [phone]   HPI:   Chief Complaint: OBESITY Latoya [name] is here to discuss her progress with her obesity treatment plan. She is following her eating plan approximately 80 % of the time and states she is exercising 15-20 minutes 5 times per week. Patient did well with weight loss in the last 2 weeks, felt like it was enough food and her hunger is controlled. She is going on vacation in [place] and is worried about weight gain while traveling.   Her weight is 187 lb (84.8 kg) today and has had a weight loss of 5 pounds over a period of 2 weeks since her last visit. She has lost 5 lbs since starting treatment  with [name].  Insulin Resistance Latoya [name] has a diagnosis of insulin resistance based on her elevated fasting insulin level >5. Although Latoya [name]'s blood glucose readings are still under good control, insulin resistance puts her at greater risk of metabolic syndrome and diabetes. She is not taking metformin currently and continues to work on diet and exercise to decrease risk of diabetes. She has a family history, her polyphagia improved with diet restriction.   Vitamin D deficiency Latoya [name] has a new diagnosis of vitamin D deficiency. She is not currently taking vit D and denies nausea, vomiting or muscle weakness. She does have fatigue and not at goal.   At risk for diabetes Latoya [name] is at higher than averagerisk for developing diabetes due to her obesity. She currently denies polyuria or polydipsia.  Wt Readings from Last 500 Encounters:  07/17/16 187 lb (84.8 kg)  07/04/16 192 lb (87.1 kg)  07/04/16 192 lb (87.1 kg)  05/15/16 195 lb 12.8 oz (88.8 kg)  01/27/16 199 lb 6.4 oz (90.4 kg)  10/27/15 192 lb 12.8 oz (87.5 kg)  10/13/15 192 lb 3.2 oz (87.2 kg)  06/14/15 189 lb 2 oz (85.8 kg)  02/15/15 189 lb 2 oz (85.8 kg)  01/10/15 175 lb (79.4 kg)  08/17/14 175 lb (79.4 kg)  08/12/14 182 lb (82.6 kg)  02/11/14 179 lb (81.2 kg)  10/01/13 175 lb 1.9 oz (79.4 kg)  05/19/13 172 lb  (78 kg)  04/06/13 172 lb 12 oz (78.4 kg)  04/01/13 171 lb 12 oz (77.9 kg)  03/18/13 169 lb (76.7 kg)  12/30/12 174 lb (78.9 kg)  11/13/12 173 lb 11.2 oz (78.8 kg)  07/22/12 184 lb (83.5 kg)  01/11/12 200 lb (90.7 kg)  07/20/11 209 lb (94.8 kg)  11/02/10 184 lb 6.4 oz (83.6 kg)  09/26/09 193 lb (87.5 kg)  07/19/08 207 lb 6.1 oz (94.1 kg)  03/05/08 201 lb (91.2 kg)  12/03/07 200 lb 8 oz (90.9 kg)  04/23/07 202 lb (91.6 kg)     ALLERGIES: Allergies  Allergen Reactions  . Monosodium Glutamate Hives    Cashew Chicken    MEDICATIONS: Current Outpatient Prescriptions on File Prior to Visit  Medication Sig Dispense Refill  . albuterol (VENTOLIN HFA) 108 (90 Base) MCG/ACT inhaler Inhale 2 puffs into the lungs every 6 (six) hours as needed for wheezing or shortness of breath. 1 Inhaler 3  . ALPRAZolam (XANAX) 0.25 MG tablet Take 1-2 tablets (0.25-0.5 mg total) by mouth at bedtime as needed for sleep or anxiety. 20 tablet 0  . atorvastatin (LIPITOR) 40 MG tablet TAKE 1 TABLET (40 MG TOTAL) BY MOUTH DAILY AT 6 PM. 90 tablet 3  . beclomethasone (QVAR) 40 MCG/ACT inhaler Inhale 2 puffs into the lungs 2 (two) times daily at  10 AM and 5 PM. 1 Inhaler 2  . cetirizine (ZYRTEC) 5 MG tablet Take 5 mg by mouth daily.    . citalopram (CELEXA) 20 MG tablet Take 1 tablet (20 mg total) by mouth daily. 90 tablet 1  . Krill Oil 500 MG CAPS Take 2 capsules by mouth every morning.    Marland Kitchen losartan (COZAAR) 100 MG tablet TAKE 1 TABLET (100 MG) BY MOUTH DAILY. 90 tablet 2   No current facility-administered medications on file prior to visit.     PAST MEDICAL HISTORY: Past Medical History:  Diagnosis Date  . Anxiety   . Back pain   . Cancer ([place]) 12/2009   BCC, SCC  . Cervical cancer screening 12/30/2012  . Dermatitis 12/03/2007   Qualifier: Diagnosis of  By: Jerold Coombe    . Eczema 01/04/2013  . Environmental allergies   . Fatty liver   . Hyperlipidemia   . Hypertension   . Morbid obesity ([place])  01/13/2012  . Multiple food allergies    cashew chicken, chinese, msg?  . Overweight 01/13/2012  . Pain of toe of left foot 08/23/2014  . Preventative health care 01/04/2013  . Vitamin D deficiency     PAST SURGICAL HISTORY: Past Surgical History:  Procedure Laterality Date  . dilation and currettage     lifestyle lift  . MOHS SURGERY  12/2009   basal cell     SOCIAL HISTORY: Social History  Substance Use Topics  . Smoking status: Former Research scientist (life sciences)  . Smokeless tobacco: Never Used     Comment: quit 2008  . Alcohol use 8.4 oz/week    14 Glasses of wine per week    FAMILY HISTORY: Family History  Problem Relation Age of Onset  . Heart disease Mother   . Diabetes Mother   . Hypertension Mother   . Hyperlipidemia Mother   . Heart Problems Mother   . Heart disease Father     pacer  . Diabetes Father   . Hypertension Father     weight controlled  . Obesity Father   . Hyperlipidemia Father   . Heart Problems Father   . Cancer Father   . Obesity Sister     s/p lap band  . Cancer Maternal Grandmother     pancreatic  . Cancer Paternal Grandmother 45    liver  . Psoriasis Brother   . Obesity Brother   . Obesity Brother   . Diabetes Brother     diet controlled  . Melanoma    . Colon cancer Neg Hx     ROS: Review of Systems  Constitutional: Positive for malaise/fatigue and weight loss.  All other systems reviewed and are negative.   PHYSICAL EXAM: Blood pressure 120/79, pulse 67, temperature 97.8 F (36.6 C), temperature source Oral, height 5\' 7"  (1.702 m), weight 187 lb (84.8 kg), SpO2 94 %. Body mass index is 29.29 kg/m. Physical Exam  Constitutional: She is oriented to person, place, and time. She appears well-developed and well-nourished.  HENT:  Head: Normocephalic and atraumatic.  Eyes: Pupils are equal, round, and reactive to light.  Neck: Normal range of motion.  Cardiovascular: Normal rate and regular rhythm.   Pulmonary/Chest: Effort normal.    Abdominal: Soft.  Musculoskeletal: Normal range of motion.  Neurological: She is alert and oriented to person, place, and time.  Skin: Skin is warm and dry.  Psychiatric: She has a normal mood and affect. Her behavior is normal.    RECENT LABS AND  TESTS: BMET    Component Value Date/Time   NA 141 07/04/2016 1152   K 5.2 07/04/2016 1152   CL 99 07/04/2016 1152   CO2 25 07/04/2016 1152   GLUCOSE 88 07/04/2016 1152   GLUCOSE 86 05/15/2016 1159   BUN 12 07/04/2016 1152   CREATININE 0.72 07/04/2016 1152   CREATININE 0.92 12/30/2012 0816   CALCIUM 9.6 07/04/2016 1152   GFRNONAA 91 07/04/2016 1152   GFRAA 105 07/04/2016 1152   Lab Results  Component Value Date   HGBA1C 5.4 07/04/2016   HGBA1C 5.6 09/26/2009   Lab Results  Component Value Date   INSULIN 8.9 07/04/2016   CBC    Component Value Date/Time   WBC 5.0 07/04/2016 1152   WBC 6.4 05/15/2016 1159   RBC 4.11 07/04/2016 1152   RBC 4.13 05/15/2016 1159   HGB 13.6 05/15/2016 1159   HCT 42.3 07/04/2016 1152   PLT 219.0 05/15/2016 1159   MCV 103 (H) 07/04/2016 1152   MCH 32.8 07/04/2016 1152   MCH 33.1 12/30/2012 0816   MCHC 31.9 07/04/2016 1152   MCHC 33.5 05/15/2016 1159   RDW 15.8 (H) 07/04/2016 1152   LYMPHSABS 1.5 07/04/2016 1152   MONOABS 0.5 01/27/2016 1055   EOSABS 0.6 (H) 07/04/2016 1152   BASOSABS 0.1 07/04/2016 1152   Iron/TIBC/Ferritin/ %Sat No results found for: IRON, TIBC, FERRITIN, IRONPCTSAT Lipid Panel     Component Value Date/Time   CHOL 188 07/04/2016 1152   TRIG 61 07/04/2016 1152   HDL 100 07/04/2016 1152   CHOLHDL 2 05/15/2016 1159   VLDL 14.8 05/15/2016 1159   LDLCALC 76 07/04/2016 1152   LDLDIRECT 144.9 12/23/2008 0932   Hepatic Function Panel     Component Value Date/Time   PROT 7.5 07/04/2016 1152   ALBUMIN 4.9 (H) 07/04/2016 1152   AST 44 (H) 07/04/2016 1152   ALT 40 (H) 07/04/2016 1152   ALKPHOS 71 07/04/2016 1152   BILITOT 0.5 07/04/2016 1152   BILIDIR 0.0 02/08/2014  0831   IBILI 0.5 12/30/2012 0816      Component Value Date/Time   TSH 1.040 07/04/2016 1152   TSH 0.55 05/15/2016 1159   TSH 0.73 06/14/2015 1225    ASSESSMENT AND PLAN: Insulin resistance  Vitamin D deficiency  At risk for diabetes mellitus  Class 1 obesity without serious comorbidity with body mass index (BMI) of 30.0 to 30.9 in adult, unspecified obesity type - BMI >30 when strated the program.  PLAN: Insulin Resistance Britzy will continue to work on weight loss, exercise, and decreasing simple carbohydrates in her diet to help decrease the risk of diabetes. We dicussed metformin including benefits and risks. She was informed that eating too many simple carbohydrates or too many calories at one sitting increases the likelihood of GI side effects. Srah will not start metformin for now and prescription was not written today. Angelyne agreed to follow up with [name] as directed to monitor her progress. Will defer Metformin for now and continue with diet/exercise. Will recheck labs in 2 months and follow up.    Vitamin D Deficiency Gloriann was informed that low vitamin D levels contributes to fatigue and are associated with obesity, breast, and colon cancer. She agrees to continue to take prescription Vit D @50 ,000 IU every week  A prescription was sent to her pharmacy today for a quantity of 4 and no refills and will follow up for routine testing of vitamin D, at least 2-3 times per year. She was informed  of the risk of over-replacement of vitamin D and agrees to not increase her dose unless he discusses this with [name] first.   Diabetes risk counselling Yaindhi was given extended (at least 15 minutes) diabetes prevention counseling today. She is 61 y.o. female and has risk factors for diabetes including obesity. We discussed intensive lifestyle modifications today with an emphasis on weight loss as well as increasing exercise and decreasing simple carbohydrates in her diet.  Obesity Dyandra is currently in the  action stage of change. As such, her goal is to continue with weight loss efforts She has agreed to follow the Category 2 plan Al has been instructed to work up to a goal of 150 minutes of combined cardio and strengthening exercise per week for weight loss and overall health benefits. We discussed the following Behavioral Modification Stratagies today: increasing lean protein intake, increasing lower sugar fruits and vacation strategies.   Anayra has agreed to follow up with our clinic in 2 weeks. She was informed of the importance of frequent follow up visits to maximize her success with intensive lifestyle modifications for her multiple health conditions.  I, April Moore, am acting as Education administrator for Dennard Nip, MD  I have reviewed the above documentation for accuracy and completeness, and I agree with the above. -Dennard Nip, MD

## 2016-07-18 ENCOUNTER — Ambulatory Visit (AMBULATORY_SURGERY_CENTER): Payer: No Typology Code available for payment source | Admitting: Internal Medicine

## 2016-07-18 ENCOUNTER — Encounter: Payer: Self-pay | Admitting: Internal Medicine

## 2016-07-18 VITALS — BP 107/70 | HR 62 | Temp 96.9°F | Resp 18 | Ht 67.0 in | Wt 192.0 lb

## 2016-07-18 DIAGNOSIS — Z1211 Encounter for screening for malignant neoplasm of colon: Secondary | ICD-10-CM

## 2016-07-18 DIAGNOSIS — Z1212 Encounter for screening for malignant neoplasm of rectum: Secondary | ICD-10-CM | POA: Diagnosis not present

## 2016-07-18 DIAGNOSIS — D120 Benign neoplasm of cecum: Secondary | ICD-10-CM | POA: Diagnosis not present

## 2016-07-18 MED ORDER — SODIUM CHLORIDE 0.9 % IV SOLN: 500.0000 mL | INTRAVENOUS | Status: DC

## 2016-07-18 NOTE — Progress Notes (Signed)
Called to room to assist during endoscopic procedure.  Patient ID and intended procedure confirmed with present staff. Received instructions for my participation in the procedure from the performing physician.  

## 2016-07-18 NOTE — Patient Instructions (Signed)
Impression/Recommendations:  Polyp handout given to patient. Diverticulosis handout given to patient.  Repeat colonoscopy in 5 years for surveillance.  YOU HAD AN ENDOSCOPIC PROCEDURE TODAY AT [facility]:   Refer to the procedure report that was given to you for any specific questions about what was found during the examination.  If the procedure report does not answer your questions, please call your gastroenterologist to clarify.  If you requested that your care partner not be given the details of your procedure findings, then the procedure report has been included in a sealed envelope for you to review at your convenience later.  YOU SHOULD EXPECT: Some feelings of bloating in the abdomen. Passage of more gas than usual.  Walking can help get rid of the air that was put into your GI tract during the procedure and reduce the bloating. If you had a lower endoscopy (such as a colonoscopy or flexible sigmoidoscopy) you may notice spotting of blood in your stool or on the toilet paper. If you underwent a bowel prep for your procedure, you may not have a normal bowel movement for a few days.  Please Note:  You might notice some irritation and congestion in your nose or some drainage.  This is from the oxygen used during your procedure.  There is no need for concern and it should clear up in a day or so.  SYMPTOMS TO REPORT IMMEDIATELY:   Following lower endoscopy (colonoscopy or flexible sigmoidoscopy):  Excessive amounts of blood in the stool  Significant tenderness or worsening of abdominal pains  Swelling of the abdomen that is new, acute  Fever of 100F or higher  For urgent or emergent issues, a gastroenterologist can be reached at any hour by calling [phone].   DIET:  We do recommend a small meal at first, but then you may proceed to your regular diet.  Drink plenty of fluids but you should avoid alcoholic beverages for 24 hours.  ACTIVITY:  You should plan to  take it easy for the rest of today and you should NOT DRIVE or use heavy machinery until tomorrow (because of the sedation medicines used during the test).    FOLLOW UP: Our staff will call the number listed on your records the next business day following your procedure to check on you and address any questions or concerns that you may have regarding the information given to you following your procedure. If we do not reach you, we will leave a message.  However, if you are feeling well and you are not experiencing any problems, there is no need to return our call.  We will assume that you have returned to your regular daily activities without incident.  If any biopsies were taken you will be contacted by phone or by letter within the next 1-3 weeks.  Please call us at [phone] if you have not heard about the biopsies in 3 weeks.    SIGNATURES/CONFIDENTIALITY: You and/or your care partner have signed paperwork which will be entered into your electronic medical record.  These signatures attest to the fact that that the information above on your After Visit Summary has been reviewed and is understood.  Full responsibility of the confidentiality of this discharge information lies with you and/or your care-partner.

## 2016-07-18 NOTE — Progress Notes (Signed)
A and O x3. Report to RN. Tolerated MAC anesthesia well.

## 2016-07-18 NOTE — Progress Notes (Signed)
Pt's states no medical or surgical changes since previsit or office visit. 

## 2016-07-18 NOTE — Op Note (Signed)
[facility] Patient Name: Addis Bobik Procedure Date: 07/18/2016 11:31 AM MRN: [id number] Endoscopist: Jerene Bears , MD Age: 61 Referring MD:  Date of Birth: Apr 24, 1956 Gender: Female Account #: [id number] Procedure:                Colonoscopy Indications:              Screening for colorectal malignant neoplasm, Last                            colonoscopy 10 years ago Medicines:                Monitored Anesthesia Care Procedure:                Pre-Anesthesia Assessment:                           - Prior to the procedure, a History and Physical                            was performed, and patient medications and                            allergies were reviewed. The patient's tolerance of                            previous anesthesia was also reviewed. The risks                            and benefits of the procedure and the sedation                            options and risks were discussed with the patient.                            All questions were answered, and informed consent                            was obtained. Prior Anticoagulants: The patient has                            taken no previous anticoagulant or antiplatelet                            agents. ASA Grade Assessment: II - A patient with                            mild systemic disease. After reviewing the risks                            and benefits, the patient was deemed in                            satisfactory condition to undergo the procedure.  After obtaining informed consent, the colonoscope                            was passed under direct vision. Throughout the                            procedure, the patient's blood pressure, pulse, and                            oxygen saturations were monitored continuously. The                            Model CF-HQ190L [id number]) scope was introduced                            through the anus and advanced to the  the cecum,                            identified by appendiceal orifice and ileocecal                            valve. The colonoscopy was performed without                            difficulty. The patient tolerated the procedure                            well. The quality of the bowel preparation was                            good. The ileocecal valve, appendiceal orifice, and                            rectum were photographed. Scope In: 11:37:04 AM Scope Out: 11:49:29 AM Scope Withdrawal Time: 0 hours 10 minutes 8 seconds  Total Procedure Duration: 0 hours 12 minutes 25 seconds  Findings:                 The digital rectal exam was normal.                           A 6 mm polyp was found in the cecum. The polyp was                            sessile and had the appearance of typical tubular                            adenoma. The polyp was removed with a cold snare.                            Resection was complete, but the polyp tissue was                            not retrieved.  Multiple small and large-mouthed diverticula were                            found in the sigmoid colon.                           Anal papilla(e) were hypertrophied as seen on                            retroflexion. Complications:            No immediate complications. Estimated Blood Loss:     Estimated blood loss was minimal. Impression:               - One 6 mm polyp in the cecum, removed with a cold                            snare. Complete resection. Polyp tissue not                            retrieved.                           - Moderate diverticulosis in the sigmoid colon. Recommendation:           - Patient has a contact number available for                            emergencies. The signs and symptoms of potential                            delayed complications were discussed with the                            patient. Return to normal activities tomorrow.                             Written discharge instructions were provided to the                            patient.                           - Resume previous diet.                           - Continue present medications.                           - Await pathology results.                           - Repeat colonoscopy in 5 years for surveillance. Jerene Bears, MD 07/18/2016 11:54:28 AM This report has been signed electronically.

## 2016-07-19 ENCOUNTER — Telehealth: Payer: Self-pay

## 2016-07-19 NOTE — Telephone Encounter (Signed)
  Follow up Call-  Call back number 07/18/2016  Post procedure Call Back phone  # [phone]  Permission to leave phone message Yes  Some recent data might be hidden     Patient questions:  Do you have a fever, pain , or abdominal swelling? No. Pain Score  0 *  Have you tolerated food without any problems? Yes.    Have you been able to return to your normal activities? Yes.    Do you have any questions about your discharge instructions: Diet   No. Medications  No. Follow up visit  No.  Do you have questions or concerns about your Care? No.  Actions: * If pain score is 4 or above: No action needed, pain <4.

## 2016-08-07 ENCOUNTER — Ambulatory Visit (INDEPENDENT_AMBULATORY_CARE_PROVIDER_SITE_OTHER): Payer: No Typology Code available for payment source | Admitting: Family Medicine

## 2016-08-07 VITALS — BP 113/76 | HR 93 | Temp 98.5°F | Ht 67.0 in | Wt 184.0 lb

## 2016-08-07 DIAGNOSIS — Z6830 Body mass index (BMI) 30.0-30.9, adult: Secondary | ICD-10-CM | POA: Diagnosis not present

## 2016-08-07 DIAGNOSIS — E669 Obesity, unspecified: Secondary | ICD-10-CM | POA: Diagnosis not present

## 2016-08-07 DIAGNOSIS — A084 Viral intestinal infection, unspecified: Secondary | ICD-10-CM | POA: Diagnosis not present

## 2016-08-07 MED FILL — CITALOPRAM HBR 20 MG TABLET: 20 | 30 days supply | Qty: 30 | Fill #1

## 2016-08-07 NOTE — Progress Notes (Signed)
Office: [phone]  /  Fax: [phone]   HPI:   Chief Complaint: OBESITY Latoya [name] is here to discuss Latoya [name] progress with Latoya [name] obesity treatment plan. Latoya [name] is following Latoya [name] eating plan approximately 80 % of the time and states Latoya [name] is walking a lot for exercise 5 times per week. Latoya [name] continues to do well with weight loss, even on vacation in [place]. Mostly by portion control/smart choices. Latoya [name] is ready to get back to category 2 plan but tired of dinner options. Latoya [name] weight is 184 lb (83.5 kg) today and has had a weight loss of 3 pounds over a period of 3 weeks since Latoya [name] last visit. Latoya [name] has lost 8 lbs since starting treatment with [name].  Viral Gastroenteritis Latoya [name] had viral gastroenteritis with diarrhea while traveling. Latoya [name] is feeling better now, some nausea still. Latoya [name] is negative for jaundice and denies abdominal pain.  Wt Readings from Last 500 Encounters:  08/07/16 184 lb (83.5 kg)  07/18/16 192 lb (87.1 kg)  07/17/16 187 lb (84.8 kg)  07/04/16 192 lb (87.1 kg)  07/04/16 192 lb (87.1 kg)  05/15/16 195 lb 12.8 oz (88.8 kg)  01/27/16 199 lb 6.4 oz (90.4 kg)  10/27/15 192 lb 12.8 oz (87.5 kg)  10/13/15 192 lb 3.2 oz (87.2 kg)  06/14/15 189 lb 2 oz (85.8 kg)  02/15/15 189 lb 2 oz (85.8 kg)  01/10/15 175 lb (79.4 kg)  08/17/14 175 lb (79.4 kg)  08/12/14 182 lb (82.6 kg)  02/11/14 179 lb (81.2 kg)  10/01/13 175 lb 1.9 oz (79.4 kg)  05/19/13 172 lb (78 kg)  04/06/13 172 lb 12 oz (78.4 kg)  04/01/13 171 lb 12 oz (77.9 kg)  03/18/13 169 lb (76.7 kg)  12/30/12 174 lb (78.9 kg)  11/13/12 173 lb 11.2 oz (78.8 kg)  07/22/12 184 lb (83.5 kg)  01/11/12 200 lb (90.7 kg)  07/20/11 209 lb (94.8 kg)  11/02/10 184 lb 6.4 oz (83.6 kg)  09/26/09 193 lb (87.5 kg)  07/19/08 207 lb 6.1 oz (94.1 kg)  03/05/08 201 lb (91.2 kg)  12/03/07 200 lb 8 oz (90.9 kg)  04/23/07 202 lb (91.6 kg)     ALLERGIES: Allergies  Allergen Reactions  . Monosodium Glutamate Hives    Cashew Chicken     MEDICATIONS: Current Outpatient Prescriptions on File Prior to Visit  Medication Sig Dispense Refill  . albuterol (VENTOLIN HFA) 108 (90 Base) MCG/ACT inhaler Inhale 2 puffs into the lungs every 6 (six) hours as needed for wheezing or shortness of breath. 1 Inhaler 3  . ALPRAZolam (XANAX) 0.25 MG tablet Take 1-2 tablets (0.25-0.5 mg total) by mouth at bedtime as needed for sleep or anxiety. 20 tablet 0  . atorvastatin (LIPITOR) 40 MG tablet TAKE 1 TABLET (40 MG TOTAL) BY MOUTH DAILY AT 6 PM. 90 tablet 3  . beclomethasone (QVAR) 40 MCG/ACT inhaler Inhale 2 puffs into the lungs 2 (two) times daily at 10 AM and 5 PM. 1 Inhaler 2  . cetirizine (ZYRTEC) 5 MG tablet Take 5 mg by mouth daily.    . citalopram (CELEXA) 20 MG tablet Take 1 tablet (20 mg total) by mouth daily. 90 tablet 1  . Krill Oil 500 MG CAPS Take 2 capsules by mouth every morning.    Marland Kitchen losartan (COZAAR) 100 MG tablet TAKE 1 TABLET (100 MG) BY MOUTH DAILY. 90 tablet 2  . Vitamin D, Ergocalciferol, (DRISDOL) 50000 units CAPS capsule Take 1 capsule (50,000 Units total) by mouth every 7 (  seven) days. 4 capsule 0   Current Facility-Administered Medications on File Prior to Visit  Medication Dose Route Frequency Provider Last Rate Last Dose  . 0.9 %  sodium chloride infusion  500 mL Intravenous Continuous Jerene Bears, MD        PAST MEDICAL HISTORY: Past Medical History:  Diagnosis Date  . Anxiety   . Back pain   . Cancer ([place]) 12/2009   BCC, SCC  . Cervical cancer screening 12/30/2012  . Dermatitis 12/03/2007   Qualifier: Diagnosis of  By: Jerold Coombe    . Eczema 01/04/2013  . Environmental allergies   . Fatty liver   . Hyperlipidemia   . Hypertension   . Morbid obesity ([place]) 01/13/2012  . Multiple food allergies    cashew chicken, chinese, msg?  . Overweight 01/13/2012  . Pain of toe of left foot 08/23/2014  . Preventative health care 01/04/2013  . Vitamin D deficiency     PAST SURGICAL HISTORY: Past Surgical  History:  Procedure Laterality Date  . dilation and currettage     lifestyle lift  . MOHS SURGERY  12/2009   basal cell     SOCIAL HISTORY: Social History  Substance Use Topics  . Smoking status: Former Research scientist (life sciences)  . Smokeless tobacco: Never Used     Comment: quit 2008  . Alcohol use 8.4 oz/week    14 Glasses of wine per week    FAMILY HISTORY: Family History  Problem Relation Age of Onset  . Heart disease Mother   . Diabetes Mother   . Hypertension Mother   . Hyperlipidemia Mother   . Heart Problems Mother   . Heart disease Father     pacer  . Diabetes Father   . Hypertension Father     weight controlled  . Obesity Father   . Hyperlipidemia Father   . Heart Problems Father   . Cancer Father   . Obesity Sister     s/p lap band  . Cancer Maternal Grandmother     pancreatic  . Cancer Paternal Grandmother 56    liver  . Psoriasis Brother   . Obesity Brother   . Obesity Brother   . Diabetes Brother     diet controlled  . Melanoma    . Colon cancer Neg Hx     ROS: Review of Systems  Constitutional: Positive for weight loss.  Gastrointestinal: Positive for diarrhea and nausea. Negative for abdominal pain.       Negative jaundice    PHYSICAL EXAM: Blood pressure 113/76, pulse 93, temperature 98.5 F (36.9 C), temperature source Oral, height 5\' 7"  (1.702 m), weight 184 lb (83.5 kg), SpO2 95 %. Body mass index is 28.82 kg/m. Physical Exam  Constitutional: Latoya [name] is oriented to person, place, and time. Latoya [name] appears well-developed and well-nourished.  Cardiovascular: Normal rate.   Pulmonary/Chest: Effort normal.  Musculoskeletal: Normal range of motion.  Neurological: Latoya [name] is oriented to person, place, and time.  Skin: Skin is warm and dry.  Psychiatric: Latoya [name] has a normal mood and affect. Latoya [name] behavior is normal.  Vitals reviewed.   RECENT LABS AND TESTS: BMET    Component Value Date/Time   NA 141 07/04/2016 1152   K 5.2 07/04/2016 1152   CL 99 07/04/2016 1152    CO2 25 07/04/2016 1152   GLUCOSE 88 07/04/2016 1152   GLUCOSE 86 05/15/2016 1159   BUN 12 07/04/2016 1152   CREATININE 0.72 07/04/2016 1152   CREATININE 0.92 12/30/2012 0816  CALCIUM 9.6 07/04/2016 1152   GFRNONAA 91 07/04/2016 1152   GFRAA 105 07/04/2016 1152   Lab Results  Component Value Date   HGBA1C 5.4 07/04/2016   HGBA1C 5.6 09/26/2009   Lab Results  Component Value Date   INSULIN 8.9 07/04/2016   CBC    Component Value Date/Time   WBC 5.0 07/04/2016 1152   WBC 6.4 05/15/2016 1159   RBC 4.11 07/04/2016 1152   RBC 4.13 05/15/2016 1159   HGB 13.6 05/15/2016 1159   HCT 42.3 07/04/2016 1152   PLT 219.0 05/15/2016 1159   MCV 103 (H) 07/04/2016 1152   MCH 32.8 07/04/2016 1152   MCH 33.1 12/30/2012 0816   MCHC 31.9 07/04/2016 1152   MCHC 33.5 05/15/2016 1159   RDW 15.8 (H) 07/04/2016 1152   LYMPHSABS 1.5 07/04/2016 1152   MONOABS 0.5 01/27/2016 1055   EOSABS 0.6 (H) 07/04/2016 1152   BASOSABS 0.1 07/04/2016 1152   Iron/TIBC/Ferritin/ %Sat No results found for: IRON, TIBC, FERRITIN, IRONPCTSAT Lipid Panel     Component Value Date/Time   CHOL 188 07/04/2016 1152   TRIG 61 07/04/2016 1152   HDL 100 07/04/2016 1152   CHOLHDL 2 05/15/2016 1159   VLDL 14.8 05/15/2016 1159   LDLCALC 76 07/04/2016 1152   LDLDIRECT 144.9 12/23/2008 0932   Hepatic Function Panel     Component Value Date/Time   PROT 7.5 07/04/2016 1152   ALBUMIN 4.9 (H) 07/04/2016 1152   AST 44 (H) 07/04/2016 1152   ALT 40 (H) 07/04/2016 1152   ALKPHOS 71 07/04/2016 1152   BILITOT 0.5 07/04/2016 1152   BILIDIR 0.0 02/08/2014 0831   IBILI 0.5 12/30/2012 0816      Component Value Date/Time   TSH 1.040 07/04/2016 1152   TSH 0.55 05/15/2016 1159   TSH 0.73 06/14/2015 1225    ASSESSMENT AND PLAN: Viral gastroenteritis  Class 1 obesity without serious comorbidity with body mass index (BMI) of 30.0 to 30.9 in adult, unspecified obesity type - Starting BMI greater then 30  PLAN:  Viral  Gastoenteritis Latoya [name] agrees to increase water and electrolytes. Latoya [name] will continue with mild foods until nausea improves and follow up in our clinic in 2 weeks.  We spent > than 50% of the 15 minute visit on the counseling as documented in the note.  Obesity Latoya [name] is currently in the action stage of change. As such, Latoya [name] goal is to continue with weight loss efforts Latoya [name] has agreed to keep a food journal with 350 to 500 calories and 30 grams of protein daily and follow the Category 2 plan Latoya [name] has been instructed to work up to a goal of 150 minutes of combined cardio and strengthening exercise per week for weight loss and overall health benefits. We discussed the following Behavioral Modification Stratagies today: increasing water, increasing lean protein intake and increasing lower sugar fruits  Latoya [name] has agreed to follow up with our clinic in 2 weeks. Latoya [name] was informed of the importance of frequent follow up visits to maximize Latoya [name] success with intensive lifestyle modifications for Latoya [name] multiple health conditions.  I, Doreene Nest, am acting as scribe for Dennard Nip, MD  I have reviewed the above documentation for accuracy and completeness, and I agree with the above. -Dennard Nip, MD

## 2016-08-08 ENCOUNTER — Encounter (INDEPENDENT_AMBULATORY_CARE_PROVIDER_SITE_OTHER): Payer: Self-pay | Admitting: Family Medicine

## 2016-08-21 ENCOUNTER — Ambulatory Visit (INDEPENDENT_AMBULATORY_CARE_PROVIDER_SITE_OTHER): Payer: No Typology Code available for payment source | Admitting: Family Medicine

## 2016-08-21 VITALS — BP 101/72 | HR 81 | Temp 98.4°F | Ht 67.0 in | Wt 184.0 lb

## 2016-08-21 DIAGNOSIS — E559 Vitamin D deficiency, unspecified: Secondary | ICD-10-CM

## 2016-08-21 DIAGNOSIS — Z6830 Body mass index (BMI) 30.0-30.9, adult: Secondary | ICD-10-CM | POA: Diagnosis not present

## 2016-08-21 DIAGNOSIS — E669 Obesity, unspecified: Secondary | ICD-10-CM

## 2016-08-21 MED ORDER — VITAMIN D (ERGOCALCIFEROL) 1.25 MG (50000 UNIT) PO CAPS: 50000.0000 [IU] | ORAL_CAPSULE | ORAL | 0 refills | Status: DC

## 2016-08-21 MED FILL — ATORVASTATIN 40 MG TABLET: 40 | 30 days supply | Qty: 30 | Fill #4 | Status: TO

## 2016-08-21 MED FILL — VIT D2 1.25 MG (50,000 UNIT: 1.25 MG | 28 days supply | Qty: 4 | Fill #0

## 2016-08-21 MED FILL — LOSARTAN POTASSIUM 100 MG T: 100 | 30 days supply | Qty: 30 | Fill #1

## 2016-08-21 NOTE — Progress Notes (Signed)
Office: [phone]  /  Fax: [phone]   HPI:   Chief Complaint: OBESITY Latoya [name] is here to discuss her progress with her obesity treatment plan. She is following her eating plan approximately 75 % of the time and states she is exercising 20 minutes 5 times per week. [name] has done well with weight loss, has maintained her weight well this last visit. She was allowed to journal for dinner and noticed her carbohydrates went up significantly. She struggled to journal on her ophone. She increaed walking. She is getting ready to go on vacation and is worried about vacation weight gain. Her weight is 184 lb (83.5 kg) today and has maintained weight over a period of 2 weeks since her last visit. She has lost 8 lbs since starting treatment with [name].  Vitamin D deficiency [name] has a diagnosis of vitamin D deficiency. She is currently taking vit D, not yet at goal and denies nausea, vomiting or muscle weakness.  Wt Readings from Last 500 Encounters:  08/21/16 184 lb (83.5 kg)  08/07/16 184 lb (83.5 kg)  07/18/16 192 lb (87.1 kg)  07/17/16 187 lb (84.8 kg)  07/04/16 192 lb (87.1 kg)  07/04/16 192 lb (87.1 kg)  05/15/16 195 lb 12.8 oz (88.8 kg)  01/27/16 199 lb 6.4 oz (90.4 kg)  10/27/15 192 lb 12.8 oz (87.5 kg)  10/13/15 192 lb 3.2 oz (87.2 kg)  06/14/15 189 lb 2 oz (85.8 kg)  02/15/15 189 lb 2 oz (85.8 kg)  01/10/15 175 lb (79.4 kg)  08/17/14 175 lb (79.4 kg)  08/12/14 182 lb (82.6 kg)  02/11/14 179 lb (81.2 kg)  10/01/13 175 lb 1.9 oz (79.4 kg)  05/19/13 172 lb (78 kg)  04/06/13 172 lb 12 oz (78.4 kg)  04/01/13 171 lb 12 oz (77.9 kg)  03/18/13 169 lb (76.7 kg)  12/30/12 174 lb (78.9 kg)  11/13/12 173 lb 11.2 oz (78.8 kg)  07/22/12 184 lb (83.5 kg)  01/11/12 200 lb (90.7 kg)  07/20/11 209 lb (94.8 kg)  11/02/10 184 lb 6.4 oz (83.6 kg)  09/26/09 193 lb (87.5 kg)  07/19/08 207 lb 6.1 oz (94.1 kg)  03/05/08 201 lb (91.2 kg)  12/03/07 200 lb 8 oz (90.9 kg)  04/23/07 202 lb (91.6 kg)       ALLERGIES: Allergies  Allergen Reactions  . Monosodium Glutamate Hives    Cashew Chicken    MEDICATIONS: Current Outpatient Prescriptions on File Prior to Visit  Medication Sig Dispense Refill  . albuterol (VENTOLIN HFA) 108 (90 Base) MCG/ACT inhaler Inhale 2 puffs into the lungs every 6 (six) hours as needed for wheezing or shortness of breath. 1 Inhaler 3  . ALPRAZolam (XANAX) 0.25 MG tablet Take 1-2 tablets (0.25-0.5 mg total) by mouth at bedtime as needed for sleep or anxiety. 20 tablet 0  . atorvastatin (LIPITOR) 40 MG tablet TAKE 1 TABLET (40 MG TOTAL) BY MOUTH DAILY AT 6 PM. 90 tablet 3  . beclomethasone (QVAR) 40 MCG/ACT inhaler Inhale 2 puffs into the lungs 2 (two) times daily at 10 AM and 5 PM. 1 Inhaler 2  . cetirizine (ZYRTEC) 5 MG tablet Take 5 mg by mouth daily.    . citalopram (CELEXA) 20 MG tablet Take 1 tablet (20 mg total) by mouth daily. 90 tablet 1  . Krill Oil 500 MG CAPS Take 2 capsules by mouth every morning.    Marland Kitchen losartan (COZAAR) 100 MG tablet TAKE 1 TABLET (100 MG) BY MOUTH DAILY. [name]  tablet 2   Current Facility-Administered Medications on File Prior to Visit  Medication Dose Route Frequency Provider Last Rate Last Dose  . 0.9 %  sodium chloride infusion  500 mL Intravenous Continuous Jerene Bears, MD        PAST MEDICAL HISTORY: Past Medical History:  Diagnosis Date  . Anxiety   . Back pain   . Cancer ([place]) 12/2009   BCC, SCC  . Cervical cancer screening 12/30/2012  . Dermatitis 12/03/2007   Qualifier: Diagnosis of  By: Jerold Coombe    . Eczema 01/04/2013  . Environmental allergies   . Fatty liver   . Hyperlipidemia   . Hypertension   . Morbid obesity ([place] 01/13/2012  . Multiple food allergies    cashew chicken, chinese, msg?  . Overweight 01/13/2012  . Pain of toe of left foot 08/23/2014  . Preventative health care 01/04/2013  . Vitamin D deficiency     PAST SURGICAL HISTORY: Past Surgical History:  Procedure Laterality Date  . dilation  and currettage     lifestyle lift  . MOHS SURGERY  12/2009   basal cell     SOCIAL HISTORY: Social History  Substance Use Topics  . Smoking status: Former Research scientist (life sciences)  . Smokeless tobacco: Never Used     Comment: quit 2008  . Alcohol use 8.4 oz/week    14 Glasses of wine per week    FAMILY HISTORY: Family History  Problem Relation Age of Onset  . Heart disease Mother   . Diabetes Mother   . Hypertension Mother   . Hyperlipidemia Mother   . Heart Problems Mother   . Heart disease Father     pacer  . Diabetes Father   . Hypertension Father     weight controlled  . Obesity Father   . Hyperlipidemia Father   . Heart Problems Father   . Cancer Father   . Obesity Sister     s/p lap band  . Cancer Maternal Grandmother     pancreatic  . Cancer Paternal Grandmother 34    liver  . Psoriasis Brother   . Obesity Brother   . Obesity Brother   . Diabetes Brother     diet controlled  . Melanoma    . Colon cancer Neg Hx     ROS: Review of Systems  Constitutional: Negative for weight loss.  Gastrointestinal: Negative for nausea and vomiting.  Musculoskeletal:       Negative muscle weakness     PHYSICAL EXAM: Blood pressure 101/72, pulse 81, temperature 98.4 F (36.9 C), temperature source Oral, height 5\' 7"  (1.702 m), weight 184 lb (83.5 kg), SpO2 97 %. Body mass index is 28.82 kg/m. Physical Exam  Constitutional: She is oriented to person, place, and time. She appears well-developed and well-nourished.  Cardiovascular: Normal rate.   Pulmonary/Chest: Effort normal.  Musculoskeletal: Normal range of motion.  Neurological: She is oriented to person, place, and time.  Skin: Skin is warm and dry.  Psychiatric: She has a normal mood and affect. Her behavior is normal.  Vitals reviewed.   RECENT LABS AND TESTS: BMET    Component Value Date/Time   NA 141 07/04/2016 1152   K 5.2 07/04/2016 1152   CL 99 07/04/2016 1152   CO2 25 07/04/2016 1152   GLUCOSE 88 07/04/2016  1152   GLUCOSE 86 05/15/2016 1159   BUN 12 07/04/2016 1152   CREATININE 0.72 07/04/2016 1152   CREATININE 0.92 12/30/2012 0816   CALCIUM  9.6 07/04/2016 1152   GFRNONAA 91 07/04/2016 1152   GFRAA 105 07/04/2016 1152   Lab Results  Component Value Date   HGBA1C 5.4 07/04/2016   HGBA1C 5.6 09/26/2009   Lab Results  Component Value Date   INSULIN 8.9 07/04/2016   CBC    Component Value Date/Time   WBC 5.0 07/04/2016 1152   WBC 6.4 05/15/2016 1159   RBC 4.11 07/04/2016 1152   RBC 4.13 05/15/2016 1159   HGB 13.6 05/15/2016 1159   HCT 42.3 07/04/2016 1152   PLT 219.0 05/15/2016 1159   MCV 103 (H) 07/04/2016 1152   MCH 32.8 07/04/2016 1152   MCH 33.1 12/30/2012 0816   MCHC 31.9 07/04/2016 1152   MCHC 33.5 05/15/2016 1159   RDW 15.8 (H) 07/04/2016 1152   LYMPHSABS 1.5 07/04/2016 1152   MONOABS 0.5 01/27/2016 1055   EOSABS 0.6 (H) 07/04/2016 1152   BASOSABS 0.1 07/04/2016 1152   Iron/TIBC/Ferritin/ %Sat No results found for: IRON, TIBC, FERRITIN, IRONPCTSAT Lipid Panel     Component Value Date/Time   CHOL 188 07/04/2016 1152   TRIG 61 07/04/2016 1152   HDL 100 07/04/2016 1152   CHOLHDL 2 05/15/2016 1159   VLDL 14.8 05/15/2016 1159   LDLCALC 76 07/04/2016 1152   LDLDIRECT 144.9 12/23/2008 0932   Hepatic Function Panel     Component Value Date/Time   PROT 7.5 07/04/2016 1152   ALBUMIN 4.9 (H) 07/04/2016 1152   AST 44 (H) 07/04/2016 1152   ALT 40 (H) 07/04/2016 1152   ALKPHOS 71 07/04/2016 1152   BILITOT 0.5 07/04/2016 1152   BILIDIR 0.0 02/08/2014 0831   IBILI 0.5 12/30/2012 0816      Component Value Date/Time   TSH 1.040 07/04/2016 1152   TSH 0.55 05/15/2016 1159   TSH 0.73 06/14/2015 1225    ASSESSMENT AND PLAN: Vitamin D deficiency - Plan: Vitamin D, Ergocalciferol, (DRISDOL) 50000 units CAPS capsule  Class 1 obesity without serious comorbidity with body mass index (BMI) of 30.0 to 30.9 in adult, unspecified obesity type - BMI 28.8 but greater then 30  when starting the program  PLAN:  Vitamin D Deficiency Kamilla was informed that low vitamin D levels contributes to fatigue and are associated with obesity, breast, and colon cancer. She agrees to continue to take prescription Vit D @50 ,000 IU every week, we will refill for 1 month and re-check labs in 6 weeks and will follow up for routine testing of vitamin D, at least 2-3 times per year. She was informed of the risk of over-replacement of vitamin D and agrees to not increase her dose unless he discusses this with [name] first. Seona agrees to follow up with our clinic in 2 weeks.  Obesity Rikayla is currently in the action stage of change. As such, her goal is to continue with weight loss efforts She has agreed to keep a food journal with 350 to 500 calories and 30+ grams of protein daily and follow the Category 2 plan Lorraina has been instructed to work up to a goal of 150 minutes of combined cardio and strengthening exercise per week for weight loss and overall health benefits. We discussed the following Behavioral Modification Stratagies today: increasing lean protein intake, increasing lower sugar fruits, holiday eating strategies  and avoiding temptations  Keirstin has agreed to follow up with our clinic in 2 weeks. She was informed of the importance of frequent follow up visits to maximize her success with intensive lifestyle modifications for her multiple  health conditions.  I, Doreene Nest, am acting as scribe for Dennard Nip, MD  I have reviewed the above documentation for accuracy and completeness, and I agree with the above. -Dennard Nip, MD

## 2016-09-05 ENCOUNTER — Ambulatory Visit (INDEPENDENT_AMBULATORY_CARE_PROVIDER_SITE_OTHER): Payer: No Typology Code available for payment source | Admitting: Family Medicine

## 2016-09-05 VITALS — BP 107/72 | HR 86 | Temp 98.0°F | Ht 67.0 in | Wt 185.0 lb

## 2016-09-05 DIAGNOSIS — Z6830 Body mass index (BMI) 30.0-30.9, adult: Secondary | ICD-10-CM

## 2016-09-05 DIAGNOSIS — J309 Allergic rhinitis, unspecified: Secondary | ICD-10-CM | POA: Diagnosis not present

## 2016-09-05 DIAGNOSIS — Z9189 Other specified personal risk factors, not elsewhere classified: Secondary | ICD-10-CM | POA: Diagnosis not present

## 2016-09-05 DIAGNOSIS — E669 Obesity, unspecified: Secondary | ICD-10-CM | POA: Diagnosis not present

## 2016-09-05 MED ORDER — FLUTICASONE PROPIONATE 50 MCG/ACT NA SUSP: NASAL | 0 refills | Status: DC

## 2016-09-05 MED FILL — CITALOPRAM HBR 20 MG TABLET: 20 | 30 days supply | Qty: 30 | Fill #2

## 2016-09-05 MED FILL — FLUTICASONE PROP 50 MCG SPR: 50 | 30 days supply | Qty: 16 | Fill #0

## 2016-09-05 NOTE — Progress Notes (Signed)
Office: [phone]  /  Fax: [phone]   HPI:   Chief Complaint: OBESITY Latoya [name] is here to discuss her progress with her obesity treatment plan. She is following her eating plan approximately 50 % of the time and states she is staying active, riding bicycles, swimming and walking for exercise. Latoya [name] had increased eating out while on vacation. She did well eating mindfully and increased exercising. She is ready to get back on track with her diet and weight loss efforts. Her weight is 185 lb (83.9 kg) today and has had a weight gain of 1 lb over a period of 2 weeks since her last visit. She has lost 7 lbs since starting treatment with [name].  Allergic Rhinitis [name] is on Zyrtec but still notes rhinorrhea and conjunctival irritation, worse in the last 2 weeks.  At risk for diabetes Latoya [name] is at higher than average risk for developing diabetes due to her obesity. She currently denies polyuria or polydipsia.  Wt Readings from Last 500 Encounters:  09/05/16 185 lb (83.9 kg)  08/21/16 184 lb (83.5 kg)  08/07/16 184 lb (83.5 kg)  07/18/16 192 lb (87.1 kg)  07/17/16 187 lb (84.8 kg)  07/04/16 192 lb (87.1 kg)  07/04/16 192 lb (87.1 kg)  05/15/16 195 lb 12.8 oz (88.8 kg)  01/27/16 199 lb 6.4 oz (90.4 kg)  10/27/15 192 lb 12.8 oz (87.5 kg)  10/13/15 192 lb 3.2 oz (87.2 kg)  06/14/15 189 lb 2 oz (85.8 kg)  02/15/15 189 lb 2 oz (85.8 kg)  01/10/15 175 lb (79.4 kg)  08/17/14 175 lb (79.4 kg)  08/12/14 182 lb (82.6 kg)  02/11/14 179 lb (81.2 kg)  10/01/13 175 lb 1.9 oz (79.4 kg)  05/19/13 172 lb (78 kg)  04/06/13 172 lb 12 oz (78.4 kg)  04/01/13 171 lb 12 oz (77.9 kg)  03/18/13 169 lb (76.7 kg)  12/30/12 174 lb (78.9 kg)  11/13/12 173 lb 11.2 oz (78.8 kg)  07/22/12 184 lb (83.5 kg)  01/11/12 200 lb (90.7 kg)  07/20/11 209 lb (94.8 kg)  11/02/10 184 lb 6.4 oz (83.6 kg)  09/26/09 193 lb (87.5 kg)  07/19/08 207 lb 6.1 oz (94.1 kg)  03/05/08 201 lb (91.2 kg)  12/03/07 200 lb 8 oz (90.9 kg)    04/23/07 202 lb (91.6 kg)     ALLERGIES: Allergies  Allergen Reactions  . Monosodium Glutamate Hives    Cashew Chicken    MEDICATIONS: Current Outpatient Prescriptions on File Prior to Visit  Medication Sig Dispense Refill  . albuterol (VENTOLIN HFA) 108 (90 Base) MCG/ACT inhaler Inhale 2 puffs into the lungs every 6 (six) hours as needed for wheezing or shortness of breath. 1 Inhaler 3  . ALPRAZolam (XANAX) 0.25 MG tablet Take 1-2 tablets (0.25-0.5 mg total) by mouth at bedtime as needed for sleep or anxiety. 20 tablet 0  . atorvastatin (LIPITOR) 40 MG tablet TAKE 1 TABLET (40 MG TOTAL) BY MOUTH DAILY AT 6 PM. 90 tablet 3  . beclomethasone (QVAR) 40 MCG/ACT inhaler Inhale 2 puffs into the lungs 2 (two) times daily at 10 AM and 5 PM. 1 Inhaler 2  . cetirizine (ZYRTEC) 5 MG tablet Take 5 mg by mouth daily.    . citalopram (CELEXA) 20 MG tablet Take 1 tablet (20 mg total) by mouth daily. 90 tablet 1  . Krill Oil 500 MG CAPS Take 2 capsules by mouth every morning.    Marland Kitchen losartan (COZAAR) 100 MG tablet TAKE 1 TABLET (  100 MG) BY MOUTH DAILY. 90 tablet 2  . Vitamin D, Ergocalciferol, (DRISDOL) 50000 units CAPS capsule Take 1 capsule (50,000 Units total) by mouth every 7 (seven) days. 4 capsule 0   Current Facility-Administered Medications on File Prior to Visit  Medication Dose Route Frequency Provider Last Rate Last Dose  . 0.9 %  sodium chloride infusion  500 mL Intravenous Continuous Jerene Bears, MD        PAST MEDICAL HISTORY: Past Medical History:  Diagnosis Date  . Anxiety   . Back pain   . Cancer ([place]) 12/2009   BCC, SCC  . Cervical cancer screening 12/30/2012  . Dermatitis 12/03/2007   Qualifier: Diagnosis of  By: Jerold Coombe    . Eczema 01/04/2013  . Environmental allergies   . Fatty liver   . Hyperlipidemia   . Hypertension   . Morbid obesity ([place]) 01/13/2012  . Multiple food allergies    cashew chicken, chinese, msg?  . Overweight 01/13/2012  . Pain of toe of left  foot 08/23/2014  . Preventative health care 01/04/2013  . Vitamin D deficiency     PAST SURGICAL HISTORY: Past Surgical History:  Procedure Laterality Date  . dilation and currettage     lifestyle lift  . MOHS SURGERY  12/2009   basal cell     SOCIAL HISTORY: Social History  Substance Use Topics  . Smoking status: Former Research scientist (life sciences)  . Smokeless tobacco: Never Used     Comment: quit 2008  . Alcohol use 8.4 oz/week    14 Glasses of wine per week    FAMILY HISTORY: Family History  Problem Relation Age of Onset  . Heart disease Mother   . Diabetes Mother   . Hypertension Mother   . Hyperlipidemia Mother   . Heart Problems Mother   . Heart disease Father     pacer  . Diabetes Father   . Hypertension Father     weight controlled  . Obesity Father   . Hyperlipidemia Father   . Heart Problems Father   . Cancer Father   . Obesity Sister     s/p lap band  . Cancer Maternal Grandmother     pancreatic  . Cancer Paternal Grandmother 20    liver  . Psoriasis Brother   . Obesity Brother   . Obesity Brother   . Diabetes Brother     diet controlled  . Melanoma    . Colon cancer Neg Hx     ROS: Review of Systems  Constitutional: Negative for weight loss.  Genitourinary: Negative for frequency.  Endo/Heme/Allergies: Negative for polydipsia.    PHYSICAL EXAM: Blood pressure 107/72, pulse 86, temperature 98 F (36.7 C), temperature source Oral, height 5\' 7"  (1.702 m), weight 185 lb (83.9 kg), SpO2 96 %. Body mass index is 28.98 kg/m. Physical Exam  Constitutional: She is oriented to person, place, and time. She appears well-developed and well-nourished.  Cardiovascular: Normal rate.   Pulmonary/Chest: Effort normal.  Musculoskeletal: Normal range of motion.  Neurological: She is oriented to person, place, and time.  Skin: Skin is warm and dry.  Psychiatric: She has a normal mood and affect. Her behavior is normal.  Vitals reviewed.   RECENT LABS AND TESTS: BMET      Component Value Date/Time   NA 141 07/04/2016 1152   K 5.2 07/04/2016 1152   CL 99 07/04/2016 1152   CO2 25 07/04/2016 1152   GLUCOSE 88 07/04/2016 1152   GLUCOSE 86  05/15/2016 1159   BUN 12 07/04/2016 1152   CREATININE 0.72 07/04/2016 1152   CREATININE 0.92 12/30/2012 0816   CALCIUM 9.6 07/04/2016 1152   GFRNONAA 91 07/04/2016 1152   GFRAA 105 07/04/2016 1152   Lab Results  Component Value Date   HGBA1C 5.4 07/04/2016   HGBA1C 5.6 09/26/2009   Lab Results  Component Value Date   INSULIN 8.9 07/04/2016   CBC    Component Value Date/Time   WBC 5.0 07/04/2016 1152   WBC 6.4 05/15/2016 1159   RBC 4.11 07/04/2016 1152   RBC 4.13 05/15/2016 1159   HGB 13.6 05/15/2016 1159   HCT 42.3 07/04/2016 1152   PLT 219.0 05/15/2016 1159   MCV 103 (H) 07/04/2016 1152   MCH 32.8 07/04/2016 1152   MCH 33.1 12/30/2012 0816   MCHC 31.9 07/04/2016 1152   MCHC 33.5 05/15/2016 1159   RDW 15.8 (H) 07/04/2016 1152   LYMPHSABS 1.5 07/04/2016 1152   MONOABS 0.5 01/27/2016 1055   EOSABS 0.6 (H) 07/04/2016 1152   BASOSABS 0.1 07/04/2016 1152   Iron/TIBC/Ferritin/ %Sat No results found for: IRON, TIBC, FERRITIN, IRONPCTSAT Lipid Panel     Component Value Date/Time   CHOL 188 07/04/2016 1152   TRIG 61 07/04/2016 1152   HDL 100 07/04/2016 1152   CHOLHDL 2 05/15/2016 1159   VLDL 14.8 05/15/2016 1159   LDLCALC 76 07/04/2016 1152   LDLDIRECT 144.9 12/23/2008 0932   Hepatic Function Panel     Component Value Date/Time   PROT 7.5 07/04/2016 1152   ALBUMIN 4.9 (H) 07/04/2016 1152   AST 44 (H) 07/04/2016 1152   ALT 40 (H) 07/04/2016 1152   ALKPHOS 71 07/04/2016 1152   BILITOT 0.5 07/04/2016 1152   BILIDIR 0.0 02/08/2014 0831   IBILI 0.5 12/30/2012 0816      Component Value Date/Time   TSH 1.040 07/04/2016 1152   TSH 0.55 05/15/2016 1159   TSH 0.73 06/14/2015 1225    ASSESSMENT AND PLAN: Allergic rhinitis, unspecified chronicity, unspecified seasonality, unspecified  trigger  At risk for diabetes mellitus  Class 1 obesity without serious comorbidity with body mass index (BMI) of 30.0 to 30.9 in adult, unspecified obesity type - patient BMI previously over 30  PLAN:  Allergic Rhinitis Zahirah agrees to start Flonase 2 sprays each nostril qd #1 with no refills and continue Flonase and follow up with our clinic in 2 weeks.  Diabetes risk counselling Jaycee was given extended (at least 15 minutes) diabetes prevention counseling today. She is 61 y.o. female and has risk factors for diabetes including obesity. We discussed intensive lifestyle modifications today with an emphasis on weight loss as well as increasing exercise and decreasing simple carbohydrates in her diet.  Obesity Cheridan is currently in the action stage of change. As such, her goal is to continue with weight loss efforts She has agreed to follow the Category 2 plan Juliza has been instructed to work up to a goal of 150 minutes of combined cardio and strengthening exercise per week or biking and walking for weight loss and overall health benefits. We discussed the following Behavioral Modification Stratagies today: increasing lean protein intake and increasing lower sugar fruits  Kirk has agreed to follow up with our clinic in 2 weeks. She was informed of the importance of frequent follow up visits to maximize her success with intensive lifestyle modifications for her multiple health conditions.  Corey Skains, am acting as scribe for Dennard Nip, MD  I have reviewed  the above documentation for accuracy and completeness, and I agree with the above. -Dennard Nip, MD

## 2016-09-17 ENCOUNTER — Ambulatory Visit (INDEPENDENT_AMBULATORY_CARE_PROVIDER_SITE_OTHER): Payer: No Typology Code available for payment source | Admitting: Family Medicine

## 2016-09-17 VITALS — BP 113/73 | HR 95 | Temp 98.1°F | Ht 67.0 in | Wt 184.0 lb

## 2016-09-17 DIAGNOSIS — E559 Vitamin D deficiency, unspecified: Secondary | ICD-10-CM

## 2016-09-17 DIAGNOSIS — J302 Other seasonal allergic rhinitis: Secondary | ICD-10-CM | POA: Diagnosis not present

## 2016-09-17 DIAGNOSIS — E669 Obesity, unspecified: Secondary | ICD-10-CM | POA: Diagnosis not present

## 2016-09-17 MED ORDER — VITAMIN D (ERGOCALCIFEROL) 1.25 MG (50000 UNIT) PO CAPS: 50000.0000 [IU] | ORAL_CAPSULE | ORAL | 0 refills | Status: DC

## 2016-09-17 MED FILL — LOSARTAN POTASSIUM 100 MG T: 100 | 30 days supply | Qty: 30 | Fill #2

## 2016-09-17 MED FILL — VIT D2 1.25 MG (50,000 UNIT: 1.25 MG | 28 days supply | Qty: 4 | Fill #0

## 2016-09-17 MED FILL — ATORVASTATIN 40 MG TABLET: 40 | 30 days supply | Qty: 30 | Fill #5 | Status: TO

## 2016-09-18 NOTE — Progress Notes (Signed)
Office: [phone]  /  Fax: [phone]   HPI:   Chief Complaint: OBESITY Latoya [name] is here to discuss her progress with her obesity treatment plan. She is following her eating plan approximately 50 % of the time and states she is walking 15 to 20 minutes 5 times per week. Latoya [name] continues to do well with weight loss, she struggles more with cravings in the evenings. She isn't journaling recently but is deviating from the category 2 plan more. Her weight is 184 lb (83.5 kg) today and has had a weight loss of 1 pound over a period of 2 weeks since her last visit. She has lost 8 lbs since starting treatment with [name].  Vitamin D deficiency Latoya [name] has a diagnosis of vitamin D deficiency. She is currently stable on vit D and denies nausea, vomiting or muscle weakness.  Allergic Rhinitis Latoya [name] started [facility] and is doing better with rhinorrhea and itching eyes. She denies epistaxis.  Wt Readings from Last 500 Encounters:  09/17/16 184 lb (83.5 kg)  09/05/16 185 lb (83.9 kg)  08/21/16 184 lb (83.5 kg)  08/07/16 184 lb (83.5 kg)  07/18/16 192 lb (87.1 kg)  07/17/16 187 lb (84.8 kg)  07/04/16 192 lb (87.1 kg)  07/04/16 192 lb (87.1 kg)  05/15/16 195 lb 12.8 oz (88.8 kg)  01/27/16 199 lb 6.4 oz (90.4 kg)  10/27/15 192 lb 12.8 oz (87.5 kg)  10/13/15 192 lb 3.2 oz (87.2 kg)  06/14/15 189 lb 2 oz (85.8 kg)  02/15/15 189 lb 2 oz (85.8 kg)  01/10/15 175 lb (79.4 kg)  08/17/14 175 lb (79.4 kg)  08/12/14 182 lb (82.6 kg)  02/11/14 179 lb (81.2 kg)  10/01/13 175 lb 1.9 oz (79.4 kg)  05/19/13 172 lb (78 kg)  04/06/13 172 lb 12 oz (78.4 kg)  04/01/13 171 lb 12 oz (77.9 kg)  03/18/13 169 lb (76.7 kg)  12/30/12 174 lb (78.9 kg)  11/13/12 173 lb 11.2 oz (78.8 kg)  07/22/12 184 lb (83.5 kg)  01/11/12 200 lb (90.7 kg)  07/20/11 209 lb (94.8 kg)  11/02/10 184 lb 6.4 oz (83.6 kg)  09/26/09 193 lb (87.5 kg)  07/19/08 207 lb 6.1 oz (94.1 kg)  03/05/08 201 lb (91.2 kg)  12/03/07 200 lb 8 oz (90.9 kg)    04/23/07 202 lb (91.6 kg)     ALLERGIES: Allergies  Allergen Reactions   Monosodium Glutamate Hives    Cashew Chicken    MEDICATIONS: Current Outpatient Prescriptions on File Prior to Visit  Medication Sig Dispense Refill   albuterol (VENTOLIN HFA) 108 (90 Base) MCG/ACT inhaler Inhale 2 puffs into the lungs every 6 (six) hours as needed for wheezing or shortness of breath. 1 Inhaler 3   ALPRAZolam (XANAX) 0.25 MG tablet Take 1-2 tablets (0.25-0.5 mg total) by mouth at bedtime as needed for sleep or anxiety. 20 tablet 0   atorvastatin (LIPITOR) 40 MG tablet TAKE 1 TABLET (40 MG TOTAL) BY MOUTH DAILY AT 6 PM. 90 tablet 3   beclomethasone (QVAR) 40 MCG/ACT inhaler Inhale 2 puffs into the lungs 2 (two) times daily at 10 AM and 5 PM. 1 Inhaler 2   cetirizine (ZYRTEC) 5 MG tablet Take 5 mg by mouth daily.     citalopram (CELEXA) 20 MG tablet Take 1 tablet (20 mg total) by mouth daily. 90 tablet 1   fluticasone (FLONASE) 50 MCG/ACT nasal spray Administer 2 sprays in each nostril daily for 30 days. 16 g 0  Krill Oil 500 MG CAPS Take 2 capsules by mouth every morning.     losartan (COZAAR) 100 MG tablet TAKE 1 TABLET (100 MG) BY MOUTH DAILY. 90 tablet 2   Current Facility-Administered Medications on File Prior to Visit  Medication Dose Route Frequency Provider Last Rate Last Dose   0.9 %  sodium chloride infusion  500 mL Intravenous Continuous Jerene Bears, MD        PAST MEDICAL HISTORY: Past Medical History:  Diagnosis Date   Anxiety    Back pain    Cancer ([place]) 12/2009   BCC, SCC   Cervical cancer screening 12/30/2012   Dermatitis 12/03/2007   Qualifier: Diagnosis of  By: Jerold Coombe     Eczema 01/04/2013   Environmental allergies    Fatty liver    Hyperlipidemia    Hypertension    Morbid obesity ([place]) 01/13/2012   Multiple food allergies    cashew chicken, chinese, msg?   Overweight 01/13/2012   Pain of toe of left foot 08/23/2014   Preventative  health care 01/04/2013   Vitamin D deficiency     PAST SURGICAL HISTORY: Past Surgical History:  Procedure Laterality Date   dilation and currettage     lifestyle lift   MOHS SURGERY  12/2009   basal cell     SOCIAL HISTORY: Social History  Substance Use Topics   Smoking status: Former Smoker   Smokeless tobacco: Never Used     Comment: quit 2008   Alcohol use 8.4 oz/week    14 Glasses of wine per week    FAMILY HISTORY: Family History  Problem Relation Age of Onset   Heart disease Mother    Diabetes Mother    Hypertension Mother    Hyperlipidemia Mother    Heart Problems Mother    Heart disease Father     pacer   Diabetes Father    Hypertension Father     weight controlled   Obesity Father    Hyperlipidemia Father    Heart Problems Father    Cancer Father    Obesity Sister     s/p lap band   Cancer Maternal Grandmother     pancreatic   Cancer Paternal Grandmother 31    liver   Psoriasis Brother    Obesity Brother    Obesity Brother    Diabetes Brother     diet controlled   Melanoma     Colon cancer Neg Hx     ROS: Review of Systems  Constitutional: Positive for weight loss.  HENT:       Rhinorrhea Negative epistaxis  Eyes:       Itchy eyes  Gastrointestinal: Negative for nausea and vomiting.  Musculoskeletal:       Negative muscle weakness    PHYSICAL EXAM: Blood pressure 113/73, pulse 95, temperature 98.1 F (36.7 C), temperature source Oral, height 5\' 7"  (1.702 m), weight 184 lb (83.5 kg), SpO2 96 %. Body mass index is 28.82 kg/m. Physical Exam  Constitutional: She is oriented to person, place, and time. She appears well-developed and well-nourished.  Cardiovascular: Normal rate.   Pulmonary/Chest: Effort normal.  Musculoskeletal: Normal range of motion.  Neurological: She is oriented to person, place, and time.  Skin: Skin is warm and dry.  Psychiatric: She has a normal mood and affect.  Vitals  reviewed.   RECENT LABS AND TESTS: BMET    Component Value Date/Time   NA 141 07/04/2016 1152   K 5.2 07/04/2016  1152   CL 99 07/04/2016 1152   CO2 25 07/04/2016 1152   GLUCOSE 88 07/04/2016 1152   GLUCOSE 86 05/15/2016 1159   BUN 12 07/04/2016 1152   CREATININE 0.72 07/04/2016 1152   CREATININE 0.92 12/30/2012 0816   CALCIUM 9.6 07/04/2016 1152   GFRNONAA 91 07/04/2016 1152   GFRAA 105 07/04/2016 1152   Lab Results  Component Value Date   HGBA1C 5.4 07/04/2016   HGBA1C 5.6 09/26/2009   Lab Results  Component Value Date   INSULIN 8.9 07/04/2016   CBC    Component Value Date/Time   WBC 5.0 07/04/2016 1152   WBC 6.4 05/15/2016 1159   RBC 4.11 07/04/2016 1152   RBC 4.13 05/15/2016 1159   HGB 13.6 05/15/2016 1159   HCT 42.3 07/04/2016 1152   PLT 219.0 05/15/2016 1159   MCV 103 (H) 07/04/2016 1152   MCH 32.8 07/04/2016 1152   MCH 33.1 12/30/2012 0816   MCHC 31.9 07/04/2016 1152   MCHC 33.5 05/15/2016 1159   RDW 15.8 (H) 07/04/2016 1152   LYMPHSABS 1.5 07/04/2016 1152   MONOABS 0.5 01/27/2016 1055   EOSABS 0.6 (H) 07/04/2016 1152   BASOSABS 0.1 07/04/2016 1152   Iron/TIBC/Ferritin/ %Sat No results found for: IRON, TIBC, FERRITIN, IRONPCTSAT Lipid Panel     Component Value Date/Time   CHOL 188 07/04/2016 1152   TRIG 61 07/04/2016 1152   HDL 100 07/04/2016 1152   CHOLHDL 2 05/15/2016 1159   VLDL 14.8 05/15/2016 1159   LDLCALC 76 07/04/2016 1152   LDLDIRECT 144.9 12/23/2008 0932   Hepatic Function Panel     Component Value Date/Time   PROT 7.5 07/04/2016 1152   ALBUMIN 4.9 (H) 07/04/2016 1152   AST 44 (H) 07/04/2016 1152   ALT 40 (H) 07/04/2016 1152   ALKPHOS 71 07/04/2016 1152   BILITOT 0.5 07/04/2016 1152   BILIDIR 0.0 02/08/2014 0831   IBILI 0.5 12/30/2012 0816      Component Value Date/Time   TSH 1.040 07/04/2016 1152   TSH 0.55 05/15/2016 1159   TSH 0.73 06/14/2015 1225    ASSESSMENT AND PLAN: Seasonal allergic rhinitis, unspecified  trigger  Vitamin D deficiency - Plan: Vitamin D, Ergocalciferol, (DRISDOL) 50000 units CAPS capsule  Class 1 obesity without serious comorbidity in adult, unspecified BMI, unspecified obesity type - Patient BMI over 30 when she started the program.  PLAN:  Vitamin D Deficiency Latoya [name] was informed that low vitamin D levels contributes to fatigue and are associated with obesity, breast, and colon cancer. She agrees to continue to take prescription Vit D @50 ,000 IU every week, we will refill for 1 month and will follow up for routine testing of vitamin D, at least 2-3 times per year. She was informed of the risk of over-replacement of vitamin D and agrees to not increase her dose unless he discusses this with [name] first. Latoya [name] agrees to follow up with our clinic in 2 weeks.  Allergic Rhinitis Latoya [name] agrees to continue Flonase as directed and will follow up with our clinic in 2 weeks.  Obesity Latoya [name] is currently in the action stage of change. As such, her goal is to continue with weight loss efforts She has agreed to keep a food journal with 1100 to 1200 calories and 70+ grams of protein daily Latoya [name] has been instructed to work up to a goal of 150 minutes of combined cardio and strengthening exercise per week for weight loss and overall health benefits. We discussed the following Behavioral Modification  Strategies today: increasing lean protein intake, work on meal planning and easy cooking plans, emotional eating strategies and ways to avoid boredom eating. We discussed easy journaling strategies.  Latoya [name] has agreed to follow up with our clinic in 2 weeks. She was informed of the importance of frequent follow up visits to maximize her success with intensive lifestyle modifications for her multiple health conditions.  I, Doreene Nest, am acting as scribe for Latoya Nip, MD  I have reviewed the above documentation for accuracy and completeness, and I agree with the above. -Latoya Nip, MD

## 2016-10-02 ENCOUNTER — Ambulatory Visit (INDEPENDENT_AMBULATORY_CARE_PROVIDER_SITE_OTHER): Payer: No Typology Code available for payment source | Admitting: Family Medicine

## 2016-10-02 VITALS — BP 127/83 | HR 83 | Temp 98.0°F | Ht 67.0 in | Wt 184.0 lb

## 2016-10-02 DIAGNOSIS — E669 Obesity, unspecified: Secondary | ICD-10-CM | POA: Diagnosis not present

## 2016-10-02 DIAGNOSIS — Z6830 Body mass index (BMI) 30.0-30.9, adult: Secondary | ICD-10-CM | POA: Diagnosis not present

## 2016-10-02 DIAGNOSIS — F3289 Other specified depressive episodes: Secondary | ICD-10-CM

## 2016-10-02 MED ORDER — BUPROPION HCL ER (SR) 150 MG PO TB12: 150.0000 mg | ORAL_TABLET | Freq: Every day | ORAL | 0 refills | Status: DC

## 2016-10-02 MED FILL — BUPROPION SR 150 MG TABLET: 150 | 30 days supply | Qty: 30 | Fill #0

## 2016-10-02 NOTE — Progress Notes (Signed)
Office: [phone]  /  Fax: [phone]   HPI:   Chief Complaint: OBESITY Latoya [name] is here to discuss her progress with her obesity treatment plan. She is on the  follow the Category 2 plan and is following her eating plan approximately 50 % of the time. She states she is ewalking 20 minutes 5 times per week. Latoya [name] has done well maintaining weight. She has noticed decreased motivation and hasn't been concentrating on weight loss. She's increased celebration eating and increased eating out. Her weight is 184 lb (83.5 kg) today and has maintained weight over a period of 2 weeks since her last visit. She has lost 8 lbs since starting treatment with [name].  Depression with emotional eating behaviors Latoya [name] is currently on celexa, mood is stable on celexa but she is still struggling with emotional eating and using food for comfort to the extent that it is negatively impacting her health. She often snacks when she is not hungry. Latoya [name] sometimes feels she is out of control and then feels guilty that she made poor food choices. She has been working on behavior modification techniques to help reduce her emotional eating and has been somewhat successful. She shows no sign of suicidal or homicidal ideations.  Depression screen [facility] 2/9 07/04/2016 05/15/2016  Decreased Interest 1 0  Down, Depressed, Hopeless 1 0  PHQ - 2 Score 2 0  Altered sleeping 0 -  Tired, decreased energy 1 -  Change in appetite 1 -  Feeling bad or failure about yourself  0 -  Trouble concentrating 0 -  Moving slowly or fidgety/restless 0 -  Suicidal thoughts 0 -  PHQ-9 Score 4 -     Wt Readings from Last 500 Encounters:  10/02/16 184 lb (83.5 kg)  09/17/16 184 lb (83.5 kg)  09/05/16 185 lb (83.9 kg)  08/21/16 184 lb (83.5 kg)  08/07/16 184 lb (83.5 kg)  07/18/16 192 lb (87.1 kg)  07/17/16 187 lb (84.8 kg)  07/04/16 192 lb (87.1 kg)  07/04/16 192 lb (87.1 kg)  05/15/16 195 lb 12.8 oz (88.8 kg)  01/27/16 199 lb 6.4 oz (90.4 kg)    10/27/15 192 lb 12.8 oz (87.5 kg)  10/13/15 192 lb 3.2 oz (87.2 kg)  06/14/15 189 lb 2 oz (85.8 kg)  02/15/15 189 lb 2 oz (85.8 kg)  01/10/15 175 lb (79.4 kg)  08/17/14 175 lb (79.4 kg)  08/12/14 182 lb (82.6 kg)  02/11/14 179 lb (81.2 kg)  10/01/13 175 lb 1.9 oz (79.4 kg)  05/19/13 172 lb (78 kg)  04/06/13 172 lb 12 oz (78.4 kg)  04/01/13 171 lb 12 oz (77.9 kg)  03/18/13 169 lb (76.7 kg)  12/30/12 174 lb (78.9 kg)  11/13/12 173 lb 11.2 oz (78.8 kg)  07/22/12 184 lb (83.5 kg)  01/11/12 200 lb (90.7 kg)  07/20/11 209 lb (94.8 kg)  11/02/10 184 lb 6.4 oz (83.6 kg)  09/26/09 193 lb (87.5 kg)  07/19/08 207 lb 6.1 oz (94.1 kg)  03/05/08 201 lb (91.2 kg)  12/03/07 200 lb 8 oz (90.9 kg)  04/23/07 202 lb (91.6 kg)     ALLERGIES: Allergies  Allergen Reactions  . Monosodium Glutamate Hives    Cashew Chicken    MEDICATIONS: Current Outpatient Prescriptions on File Prior to Visit  Medication Sig Dispense Refill  . albuterol (VENTOLIN HFA) 108 (90 Base) MCG/ACT inhaler Inhale 2 puffs into the lungs every 6 (six) hours as needed for wheezing or shortness of breath. 1 Inhaler  3  . ALPRAZolam (XANAX) 0.25 MG tablet Take 1-2 tablets (0.25-0.5 mg total) by mouth at bedtime as needed for sleep or anxiety. 20 tablet 0  . atorvastatin (LIPITOR) 40 MG tablet TAKE 1 TABLET (40 MG TOTAL) BY MOUTH DAILY AT 6 PM. 90 tablet 3  . beclomethasone (QVAR) 40 MCG/ACT inhaler Inhale 2 puffs into the lungs 2 (two) times daily at 10 AM and 5 PM. 1 Inhaler 2  . cetirizine (ZYRTEC) 5 MG tablet Take 5 mg by mouth daily.    . citalopram (CELEXA) 20 MG tablet Take 1 tablet (20 mg total) by mouth daily. 90 tablet 1  . fluticasone (FLONASE) 50 MCG/ACT nasal spray Administer 2 sprays in each nostril daily for 30 days. 16 g 0  . Krill Oil 500 MG CAPS Take 2 capsules by mouth every morning.    Marland Kitchen losartan (COZAAR) 100 MG tablet TAKE 1 TABLET (100 MG) BY MOUTH DAILY. 90 tablet 2  . Vitamin D, Ergocalciferol,  (DRISDOL) 50000 units CAPS capsule Take 1 capsule (50,000 Units total) by mouth every 7 (seven) days. 4 capsule 0   Current Facility-Administered Medications on File Prior to Visit  Medication Dose Route Frequency Provider Last Rate Last Dose  . 0.9 %  sodium chloride infusion  500 mL Intravenous Continuous Pyrtle, Lajuan Lines, MD        PAST MEDICAL HISTORY: Past Medical History:  Diagnosis Date  . Anxiety   . Back pain   . Cancer ([place]) 12/2009   BCC, SCC  . Cervical cancer screening 12/30/2012  . Dermatitis 12/03/2007   Qualifier: Diagnosis of  By: Jerold Coombe    . Eczema 01/04/2013  . Environmental allergies   . Fatty liver   . Hyperlipidemia   . Hypertension   . Morbid obesity ([place]) 01/13/2012  . Multiple food allergies    cashew chicken, chinese, msg?  . Overweight 01/13/2012  . Pain of toe of left foot 08/23/2014  . Preventative health care 01/04/2013  . Vitamin D deficiency     PAST SURGICAL HISTORY: Past Surgical History:  Procedure Laterality Date  . dilation and currettage     lifestyle lift  . MOHS SURGERY  12/2009   basal cell     SOCIAL HISTORY: Social History  Substance Use Topics  . Smoking status: Former Research scientist (life sciences)  . Smokeless tobacco: Never Used     Comment: quit 2008  . Alcohol use 8.4 oz/week    14 Glasses of wine per week    FAMILY HISTORY: Family History  Problem Relation Age of Onset  . Heart disease Mother   . Diabetes Mother   . Hypertension Mother   . Hyperlipidemia Mother   . Heart Problems Mother   . Heart disease Father     pacer  . Diabetes Father   . Hypertension Father     weight controlled  . Obesity Father   . Hyperlipidemia Father   . Heart Problems Father   . Cancer Father   . Obesity Sister     s/p lap band  . Cancer Maternal Grandmother     pancreatic  . Cancer Paternal Grandmother 14    liver  . Psoriasis Brother   . Obesity Brother   . Obesity Brother   . Diabetes Brother     diet controlled  . Melanoma    . Colon  cancer Neg Hx     ROS: Review of Systems  Constitutional: Negative for weight loss.  Psychiatric/Behavioral: Positive for  depression. Negative for suicidal ideas.    PHYSICAL EXAM: Blood pressure 127/83, pulse 83, temperature 98 F (36.7 C), temperature source Oral, height 5\' 7"  (1.702 m), weight 184 lb (83.5 kg), SpO2 95 %. Body mass index is 28.82 kg/m. Physical Exam  Constitutional: She is oriented to person, place, and time. She appears well-developed and well-nourished.  Cardiovascular: Normal rate.   Pulmonary/Chest: Effort normal.  Musculoskeletal: Normal range of motion.  Neurological: She is oriented to person, place, and time.  Skin: Skin is warm and dry.  Psychiatric: She has a normal mood and affect. Her behavior is normal.  Vitals reviewed.   RECENT LABS AND TESTS: BMET    Component Value Date/Time   NA 141 07/04/2016 1152   K 5.2 07/04/2016 1152   CL 99 07/04/2016 1152   CO2 25 07/04/2016 1152   GLUCOSE 88 07/04/2016 1152   GLUCOSE 86 05/15/2016 1159   BUN 12 07/04/2016 1152   CREATININE 0.72 07/04/2016 1152   CREATININE 0.92 12/30/2012 0816   CALCIUM 9.6 07/04/2016 1152   GFRNONAA 91 07/04/2016 1152   GFRAA 105 07/04/2016 1152   Lab Results  Component Value Date   HGBA1C 5.4 07/04/2016   HGBA1C 5.6 09/26/2009   Lab Results  Component Value Date   INSULIN 8.9 07/04/2016   CBC    Component Value Date/Time   WBC 5.0 07/04/2016 1152   WBC 6.4 05/15/2016 1159   RBC 4.11 07/04/2016 1152   RBC 4.13 05/15/2016 1159   HGB 13.6 05/15/2016 1159   HCT 42.3 07/04/2016 1152   PLT 219.0 05/15/2016 1159   MCV 103 (H) 07/04/2016 1152   MCH 32.8 07/04/2016 1152   MCH 33.1 12/30/2012 0816   MCHC 31.9 07/04/2016 1152   MCHC 33.5 05/15/2016 1159   RDW 15.8 (H) 07/04/2016 1152   LYMPHSABS 1.5 07/04/2016 1152   MONOABS 0.5 01/27/2016 1055   EOSABS 0.6 (H) 07/04/2016 1152   BASOSABS 0.1 07/04/2016 1152   Iron/TIBC/Ferritin/ %Sat No results found for:  IRON, TIBC, FERRITIN, IRONPCTSAT Lipid Panel     Component Value Date/Time   CHOL 188 07/04/2016 1152   TRIG 61 07/04/2016 1152   HDL 100 07/04/2016 1152   CHOLHDL 2 05/15/2016 1159   VLDL 14.8 05/15/2016 1159   LDLCALC 76 07/04/2016 1152   LDLDIRECT 144.9 12/23/2008 0932   Hepatic Function Panel     Component Value Date/Time   PROT 7.5 07/04/2016 1152   ALBUMIN 4.9 (H) 07/04/2016 1152   AST 44 (H) 07/04/2016 1152   ALT 40 (H) 07/04/2016 1152   ALKPHOS 71 07/04/2016 1152   BILITOT 0.5 07/04/2016 1152   BILIDIR 0.0 02/08/2014 0831   IBILI 0.5 12/30/2012 0816      Component Value Date/Time   TSH 1.040 07/04/2016 1152   TSH 0.55 05/15/2016 1159   TSH 0.73 06/14/2015 1225    ASSESSMENT AND PLAN: Other depression - Plan: buPROPion (WELLBUTRIN SR) 150 MG 12 hr tablet  Class 1 obesity without serious comorbidity with body mass index (BMI) of 30.0 to 30.9 in adult, unspecified obesity type - Starting BMI greater then 30  PLAN:  Depression with Emotional Eating Behaviors We discussed behavior modification techniques today to help Kadience deal with her emotional eating and depression. She has agreed to continue to take celexa and start to take Wellbutrin SR 150 mg every morning #30 with no refills and agreed to follow up as directed.  Obesity Latoya [name] is currently in the action stage of change. As  such, her goal is to continue with weight loss efforts She has agreed to follow the Category 2 plan Latoya [name] has been instructed to work up to a goal of 150 minutes of combined cardio and strengthening exercise per week for weight loss and overall health benefits. We discussed the following Behavioral Modification Stratagies today: increasing lean protein intake, decrease eating out and work on meal planning.  Latoya [name] has agreed to follow up with our clinic in 2 weeks. She was informed of the importance of frequent follow up visits to maximize her success with intensive lifestyle modifications for her  multiple health conditions.  I, Doreene Nest, am acting as scribe for Dennard Nip, MD  I have reviewed the above documentation for accuracy and completeness, and I agree with the above. -Dennard Nip, MD

## 2016-10-03 MED FILL — CITALOPRAM HBR 20 MG TABLET: 20 | 34 days supply | Qty: 34 | Fill #0

## 2016-10-09 MED FILL — QVAR 40 MCG ORAL INHALER: 40 | 30 days supply | Qty: 9 | Fill #1

## 2016-10-16 ENCOUNTER — Encounter (INDEPENDENT_AMBULATORY_CARE_PROVIDER_SITE_OTHER): Payer: Self-pay

## 2016-10-16 ENCOUNTER — Ambulatory Visit (INDEPENDENT_AMBULATORY_CARE_PROVIDER_SITE_OTHER): Payer: No Typology Code available for payment source | Admitting: Family Medicine

## 2016-10-19 MED FILL — ATORVASTATIN 40 MG TABLET: 40 | 30 days supply | Qty: 30 | Fill #6 | Status: TO

## 2016-10-19 MED FILL — LOSARTAN POTASSIUM 100 MG T: 100 | 30 days supply | Qty: 30 | Fill #3

## 2016-11-08 MED FILL — CITALOPRAM HBR 20 MG TABLET: 20 | 34 days supply | Qty: 34 | Fill #1

## 2016-11-13 ENCOUNTER — Encounter: Payer: Self-pay | Admitting: Family Medicine

## 2016-11-13 ENCOUNTER — Ambulatory Visit: Payer: No Typology Code available for payment source | Admitting: Family Medicine

## 2016-11-13 ENCOUNTER — Ambulatory Visit (INDEPENDENT_AMBULATORY_CARE_PROVIDER_SITE_OTHER): Payer: PRIVATE HEALTH INSURANCE | Admitting: Family Medicine

## 2016-11-13 VITALS — BP 138/90 | HR 73 | Temp 98.2°F | Resp 18 | Wt 187.0 lb

## 2016-11-13 DIAGNOSIS — I10 Essential (primary) hypertension: Secondary | ICD-10-CM

## 2016-11-13 DIAGNOSIS — E785 Hyperlipidemia, unspecified: Secondary | ICD-10-CM | POA: Diagnosis not present

## 2016-11-13 DIAGNOSIS — E559 Vitamin D deficiency, unspecified: Secondary | ICD-10-CM

## 2016-11-13 DIAGNOSIS — E538 Deficiency of other specified B group vitamins: Secondary | ICD-10-CM

## 2016-11-13 DIAGNOSIS — E782 Mixed hyperlipidemia: Secondary | ICD-10-CM

## 2016-11-13 DIAGNOSIS — F419 Anxiety disorder, unspecified: Secondary | ICD-10-CM

## 2016-11-13 DIAGNOSIS — E669 Obesity, unspecified: Secondary | ICD-10-CM

## 2016-11-13 MED ORDER — RANITIDINE HCL 150 MG PO TABS: 150.0000 mg | ORAL_TABLET | Freq: Two times a day (BID) | ORAL | Status: DC

## 2016-11-13 MED ORDER — ALPRAZOLAM 0.25 MG PO TABS: 0.2500 mg | ORAL_TABLET | Freq: Every evening | ORAL | 1 refills | Status: DC | PRN

## 2016-11-13 MED ORDER — ATORVASTATIN CALCIUM 40 MG PO TABS: 40.0000 mg | ORAL_TABLET | Freq: Every day | ORAL | 3 refills | Status: DC

## 2016-11-13 MED ORDER — LOSARTAN POTASSIUM 100 MG PO TABS: ORAL_TABLET | ORAL | 2 refills | Status: DC

## 2016-11-13 MED ORDER — CLOBETASOL PROPIONATE 0.05 % EX SOLN: 1.0000 "application " | Freq: Two times a day (BID) | CUTANEOUS | 1 refills | Status: DC

## 2016-11-13 MED ORDER — CETIRIZINE HCL 10 MG PO TABS: 10.0000 mg | ORAL_TABLET | Freq: Two times a day (BID) | ORAL | 11 refills | Status: DC

## 2016-11-13 MED FILL — CLOBETASOL 0.05% SOLUTION: 0.05 | 14 days supply | Qty: 25 | Fill #0

## 2016-11-13 MED FILL — ATORVASTATIN 40 MG TABLET: 40 | 30 days supply | Qty: 30 | Fill #0

## 2016-11-13 MED FILL — LOSARTAN POTASSIUM 100 MG T: 100 | 30 days supply | Qty: 30 | Fill #0

## 2016-11-13 NOTE — Assessment & Plan Note (Signed)
Well controlled, no changes to meds. Encouraged heart healthy diet such as the DASH diet and exercise as tolerated.  °

## 2016-11-13 NOTE — Assessment & Plan Note (Signed)
Was not taking Vit B12 when her labs were last run will continue to monitor

## 2016-11-13 NOTE — Patient Instructions (Addendum)
Witch Hazel Astringent for relief  Vitamin D 2000 IU daily  Eczema Eczema, also called atopic dermatitis, is a skin disorder that causes inflammation of the skin. It causes a red rash and dry, scaly skin. The skin becomes very itchy. Eczema is generally worse during the cooler winter months and often improves with the warmth of summer. Eczema usually starts showing signs in infancy. Some children outgrow eczema, but it may last through adulthood. What are the causes? The exact cause of eczema is not known, but it appears to run in families. People with eczema often have a family history of eczema, allergies, asthma, or hay fever. Eczema is not contagious. Flare-ups of the condition may be caused by:  Contact with something you are sensitive or allergic to.  Stress.  What are the signs or symptoms?  Dry, scaly skin.  Red, itchy rash.  Itchiness. This may occur before the skin rash and may be very intense. How is this diagnosed? The diagnosis of eczema is usually made based on symptoms and medical history. How is this treated? Eczema cannot be cured, but symptoms usually can be controlled with treatment and other strategies. A treatment plan might include:  Controlling the itching and scratching. ? Use over-the-counter antihistamines as directed for itching. This is especially useful at night when the itching tends to be worse. ? Use over-the-counter steroid creams as directed for itching. ? Avoid scratching. Scratching makes the rash and itching worse. It may also result in a skin infection (impetigo) due to a break in the skin caused by scratching.  Keeping the skin well moisturized with creams every day. This will seal in moisture and help prevent dryness. Lotions that contain alcohol and water should be avoided because they can dry the skin.  Limiting exposure to things that you are sensitive or allergic to (allergens).  Recognizing situations that cause stress.  Developing a  plan to manage stress.  Follow these instructions at home:  Only take over-the-counter or prescription medicines as directed by your health care provider.  Do not use anything on the skin without checking with your health care provider.  Keep baths or showers short (5 minutes) in warm (not hot) water. Use mild cleansers for bathing. These should be unscented. You may add nonperfumed bath oil to the bath water. It is best to avoid soap and bubble bath.  Immediately after a bath or shower, when the skin is still damp, apply a moisturizing ointment to the entire body. This ointment should be a petroleum ointment. This will seal in moisture and help prevent dryness. The thicker the ointment, the better. These should be unscented.  Keep fingernails cut short. Children with eczema may need to wear soft gloves or mittens at night after applying an ointment.  Dress in clothes made of cotton or cotton blends. Dress lightly, because heat increases itching.  A child with eczema should stay away from anyone with fever blisters or cold sores. The virus that causes fever blisters (herpes simplex) can cause a serious skin infection in children with eczema. Contact a health care provider if:  Your itching interferes with sleep.  Your rash gets worse or is not better within 1 week after starting treatment.  You see pus or soft yellow scabs in the rash area.  You have a fever.  You have a rash flare-up after contact with someone who has fever blisters. This information is not intended to replace advice given to you by your health care provider. Make  sure you discuss any questions you have with your health care provider. Document Released: 05/11/2000 Document Revised: 10/20/2015 Document Reviewed: 12/15/2012 Elsevier Interactive Patient Education  2017 [employer].

## 2016-11-13 NOTE — Progress Notes (Signed)
Subjective:  I acted as a Education administrator for Dr. Charlett Blake. [place], [place]  Patient ID: Latoya [name], female    DOB: 1955/07/05, 61 y.o.   MRN: [id number]  No chief complaint on file.   HPI  Patient is in today for a 6 month follow up. Patient is following up on HTN, hyperlipidemia, and other medical concerns. Patient c/o dermatitis on her right elbow and also her head. She has used her Clobetasol cream with white gloves at night with some relief over the hands. Has tried various shampoos on scalp without relief. Is in the process of switching to [facility] dermatology but has not been to see them yet. Agrees to go to be evaluated. Denies CP/palp/SOB/HA/congestion/fevers/GI or GU c/o. Taking meds as prescribed   Patient Care Team: Mosie Lukes, MD as PCP - General (Family Medicine)   Past Medical History:  Diagnosis Date  . Anxiety   . Back pain   . Cancer ([place]) 12/2009   BCC, SCC  . Cervical cancer screening 12/30/2012  . Dermatitis 12/03/2007   Qualifier: Diagnosis of  By: Jerold Coombe    . Eczema 01/04/2013  . Environmental allergies   . Fatty liver   . Hyperlipidemia   . Hypertension   . Morbid obesity ([place]) 01/13/2012  . Multiple food allergies    cashew chicken, chinese, msg?  . Obesity 01/13/2012  . Overweight 01/13/2012  . Pain of toe of left foot 08/23/2014  . Preventative health care 01/04/2013  . Vitamin D deficiency     Past Surgical History:  Procedure Laterality Date  . dilation and currettage     lifestyle lift  . MOHS SURGERY  12/2009   basal cell     Family History  Problem Relation Age of Onset  . Heart disease Mother   . Diabetes Mother   . Hypertension Mother   . Hyperlipidemia Mother   . Heart Problems Mother   . Heart disease Father        pacer  . Diabetes Father   . Hypertension Father        weight controlled  . Obesity Father   . Hyperlipidemia Father   . Heart Problems Father   . Cancer Father   . Obesity Sister        s/p lap band  . Cancer  Maternal Grandmother        pancreatic  . Cancer Paternal Grandmother 75       liver  . Psoriasis Brother   . Obesity Brother   . Obesity Brother   . Diabetes Brother        diet controlled  . Melanoma Unknown   . Colon cancer Neg Hx     Social History   Social History  . Marital status: Single    Spouse name: N/A  . Number of children: N/A  . Years of education: N/A   Occupational History  . 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 Smoking status: Former Research scientist (life sciences)  . Smokeless tobacco: Never Used     Comment: quit 2008  . Alcohol use 8.4 oz/week    14 Glasses of wine per week  . Drug use: No  . Sexual activity: Not Currently    Partners: Male     Comment: lives ALONE. no dietary restrictions, works full time Insurance underwriter   Other Topics Concern  . Not on file   Social History Narrative  . No narrative on file  Outpatient Medications Prior to Visit  Medication Sig Dispense Refill  . albuterol (VENTOLIN HFA) 108 (90 Base) MCG/ACT inhaler Inhale 2 puffs into the lungs every 6 (six) hours as needed for wheezing or shortness of breath. 1 Inhaler 3  . citalopram (CELEXA) 20 MG tablet Take 1 tablet (20 mg total) by mouth daily. 90 tablet 1  . Krill Oil 500 MG CAPS Take 2 capsules by mouth every morning.    Marland Kitchen ALPRAZolam (XANAX) 0.25 MG tablet Take 1-2 tablets (0.25-0.5 mg total) by mouth at bedtime as needed for sleep or anxiety. 20 tablet 0  . atorvastatin (LIPITOR) 40 MG tablet TAKE 1 TABLET (40 MG TOTAL) BY MOUTH DAILY AT 6 PM. 90 tablet 3  . cetirizine (ZYRTEC) 5 MG tablet Take 5 mg by mouth daily.    Marland Kitchen losartan (COZAAR) 100 MG tablet TAKE 1 TABLET (100 MG) BY MOUTH DAILY. 90 tablet 2  . beclomethasone (QVAR) 40 MCG/ACT inhaler Inhale 2 puffs into the lungs 2 (two) times daily at 10 AM and 5 PM. (Patient not taking: Reported on 11/13/2016) 1 Inhaler 2  . fluticasone (FLONASE) 50 MCG/ACT nasal spray Administer 2 sprays in each nostril daily for  30 days. 16 g 0  . buPROPion (WELLBUTRIN SR) 150 MG 12 hr tablet Take 1 tablet (150 mg total) by mouth daily. 30 tablet 0  . Vitamin D, Ergocalciferol, (DRISDOL) 50000 units CAPS capsule Take 1 capsule (50,000 Units total) by mouth every 7 (seven) days. 4 capsule 0  . 0.9 %  sodium chloride infusion      No facility-administered medications prior to visit.     Allergies  Allergen Reactions  . Monosodium Glutamate Hives    Cashew Chicken    Review of Systems  Constitutional: Negative for fever and malaise/fatigue.  HENT: Negative for congestion.   Eyes: Negative for blurred vision.  Respiratory: Negative for shortness of breath.   Cardiovascular: Negative for chest pain, palpitations and leg swelling.  Gastrointestinal: Negative for abdominal pain, blood in stool and nausea.  Genitourinary: Negative for dysuria and frequency.  Musculoskeletal: Negative for falls.  Skin: Positive for itching and rash.  Neurological: Negative for dizziness, loss of consciousness and headaches.  Endo/Heme/Allergies: Negative for environmental allergies.  Psychiatric/Behavioral: Negative for depression. The patient is not nervous/anxious.        Objective:    Physical Exam  Constitutional: She is oriented to person, place, and time. She appears well-developed and well-nourished. No distress.  HENT:  Head: Normocephalic and atraumatic.  Nose: Nose normal.  Eyes: Right eye exhibits no discharge. Left eye exhibits no discharge.  Neck: Normal range of motion. Neck supple.  Cardiovascular: Normal rate and regular rhythm.   No murmur heard. Pulmonary/Chest: Effort normal and breath sounds normal.  Abdominal: Soft. Bowel sounds are normal. There is no tenderness.  Musculoskeletal: She exhibits no edema.  Neurological: She is alert and oriented to person, place, and time.  Skin: Skin is warm and dry. Rash noted. There is erythema.  Red, scaly patches over elbows, hands, base of scalp  Psychiatric:  She has a normal mood and affect.  Nursing note and vitals reviewed.   BP 138/90 (BP Location: Left Arm, Patient Position: Sitting, Cuff Size: Normal)   Pulse 73   Temp 98.2 F (36.8 C) (Oral)   Resp 18   Wt 187 lb (84.8 kg)   LMP  (LMP Unknown) Comment: 2012 ?  SpO2 94%   BMI 29.29 kg/m  Wt Readings from Last  3 Encounters:  11/13/16 187 lb (84.8 kg)  10/02/16 184 lb (83.5 kg)  09/17/16 184 lb (83.5 kg)   BP Readings from Last 3 Encounters:  11/13/16 138/90  10/02/16 127/83  09/17/16 113/73     Immunization History  Administered Date(s) Administered  . Influenza,inj,Quad PF,36+ Mos 05/15/2016  . Td 05/29/1995  . Tdap 12/30/2012, 01/10/2015    Health Maintenance  Topic Date Due  . INFLUENZA VACCINE  12/26/2016  . MAMMOGRAM  01/17/2018  . PAP SMEAR  05/16/2019  . COLONOSCOPY  07/18/2021  . TETANUS/TDAP  01/09/2025  . Hepatitis C Screening  Completed  . HIV Screening  Completed    Lab Results  Component Value Date   WBC 5.0 07/04/2016   HGB 13.5 07/04/2016   HCT 42.3 07/04/2016   PLT 219.0 05/15/2016   GLUCOSE 88 07/04/2016   CHOL 188 07/04/2016   TRIG 61 07/04/2016   HDL 100 07/04/2016   LDLDIRECT 144.9 12/23/2008   LDLCALC 76 07/04/2016   ALT 40 (H) 07/04/2016   AST 44 (H) 07/04/2016   NA 141 07/04/2016   K 5.2 07/04/2016   CL 99 07/04/2016   CREATININE 0.72 07/04/2016   BUN 12 07/04/2016   CO2 25 07/04/2016   TSH 1.040 07/04/2016   HGBA1C 5.4 07/04/2016    Lab Results  Component Value Date   TSH 1.040 07/04/2016   Lab Results  Component Value Date   WBC 5.0 07/04/2016   HGB 13.5 07/04/2016   HCT 42.3 07/04/2016   MCV 103 (H) 07/04/2016   PLT 219.0 05/15/2016   Lab Results  Component Value Date   NA 141 07/04/2016   K 5.2 07/04/2016   CO2 25 07/04/2016   GLUCOSE 88 07/04/2016   BUN 12 07/04/2016   CREATININE 0.72 07/04/2016   BILITOT 0.5 07/04/2016   ALKPHOS 71 07/04/2016   AST 44 (H) 07/04/2016   ALT 40 (H) 07/04/2016   PROT  7.5 07/04/2016   ALBUMIN 4.9 (H) 07/04/2016   CALCIUM 9.6 07/04/2016   GFR 79.77 05/15/2016   Lab Results  Component Value Date   CHOL 188 07/04/2016   Lab Results  Component Value Date   HDL 100 07/04/2016   Lab Results  Component Value Date   LDLCALC 76 07/04/2016   Lab Results  Component Value Date   TRIG 61 07/04/2016   Lab Results  Component Value Date   CHOLHDL 2 05/15/2016   Lab Results  Component Value Date   HGBA1C 5.4 07/04/2016         Assessment & Plan:   Problem List Items Addressed This Visit    Hyperlipidemia, mixed    Encouraged heart healthy diet, increase exercise, avoid trans fats, consider a krill oil cap daily      Relevant Medications   atorvastatin (LIPITOR) 40 MG tablet   losartan (COZAAR) 100 MG tablet   Other Relevant Orders   TSH   Essential hypertension - Primary    Well controlled, no changes to meds. Encouraged heart healthy diet such as the DASH diet and exercise as tolerated.       Relevant Medications   atorvastatin (LIPITOR) 40 MG tablet   losartan (COZAAR) 100 MG tablet   Other Relevant Orders   Comprehensive metabolic panel   CBC   Obesity    Encouraged DASH diet, decrease po intake and increase exercise as tolerated. Needs 7-8 hours of sleep nightly. Avoid trans fats, eat small, frequent meals every 4-5 hours with lean proteins,  complex carbs and healthy fats. Minimize simple carbs. Did see bariatrics but due to her insurance being high deductible plan she could not afford to keep going.       Anxiety   Relevant Medications   ALPRAZolam (XANAX) 0.25 MG tablet   Vitamin B12 deficiency    Was not taking Vit B12 when her labs were last run will continue to monitor      Relevant Orders   Vitamin B12   Vitamin D deficiency    Has not taken the 50000 IU dose for about a month      Relevant Orders   VITAMIN D 25 Hydroxy (Vit-D Deficiency, Fractures)   VITAMIN D 25 Hydroxy (Vit-D Deficiency, Fractures)    Other  Visit Diagnoses    Hyperlipidemia, unspecified hyperlipidemia type       Relevant Medications   atorvastatin (LIPITOR) 40 MG tablet   losartan (COZAAR) 100 MG tablet   Other Relevant Orders   Lipid panel      I have discontinued Ms. Vanderloop's cetirizine, Vitamin D (Ergocalciferol), and buPROPion. I am also having her start on clobetasol, cetirizine, and ranitidine. Additionally, I am having her maintain her beclomethasone, citalopram, albuterol, Krill Oil, fluticasone, atorvastatin, ALPRAZolam, and losartan. We will stop administering sodium chloride.  Meds ordered this encounter  Medications  . atorvastatin (LIPITOR) 40 MG tablet    Sig: Take 1 tablet (40 mg total) by mouth daily at 6 PM.    Dispense:  90 tablet    Refill:  3  . ALPRAZolam (XANAX) 0.25 MG tablet    Sig: Take 1-2 tablets (0.25-0.5 mg total) by mouth at bedtime as needed for sleep or anxiety.    Dispense:  20 tablet    Refill:  1  . losartan (COZAAR) 100 MG tablet    Sig: TAKE 1 TABLET (100 MG) BY MOUTH DAILY.    Dispense:  90 tablet    Refill:  2  . clobetasol (TEMOVATE) 0.05 % external solution    Sig: Apply 1 application topically 2 (two) times daily.    Dispense:  50 mL    Refill:  1  . cetirizine (ZYRTEC) 10 MG tablet    Sig: Take 1 tablet (10 mg total) by mouth 2 (two) times daily.    Dispense:  30 tablet    Refill:  11  . ranitidine (ZANTAC) 150 MG tablet    Sig: Take 1 tablet (150 mg total) by mouth 2 (two) times daily.    CMA served as Education administrator during this visit. History, Physical and Plan performed by medical provider. Documentation and orders reviewed and attested to.  Penni Homans, MD

## 2016-11-13 NOTE — Assessment & Plan Note (Signed)
Encouraged heart healthy diet, increase exercise, avoid trans fats, consider a krill oil cap daily 

## 2016-11-13 NOTE — Assessment & Plan Note (Addendum)
Encouraged DASH diet, decrease po intake and increase exercise as tolerated. Needs 7-8 hours of sleep nightly. Avoid trans fats, eat small, frequent meals every 4-5 hours with lean proteins, complex carbs and healthy fats. Minimize simple carbs. Did see bariatrics but due to her insurance being high deductible plan she could not afford to keep going.

## 2016-11-13 NOTE — Assessment & Plan Note (Signed)
Has not taken the 50000 IU dose for about a month

## 2016-11-15 MED FILL — ALPRAZolam 0.25 MG TABS: 0.25 | 10 days supply | Qty: 20 | Fill #0

## 2016-11-19 ENCOUNTER — Telehealth: Payer: Self-pay | Admitting: Family Medicine

## 2016-11-19 DIAGNOSIS — R05 Cough: Secondary | ICD-10-CM

## 2016-11-19 DIAGNOSIS — R059 Cough, unspecified: Secondary | ICD-10-CM

## 2016-11-19 DIAGNOSIS — Z7729 Contact with and (suspected ) exposure to other hazardous substances: Secondary | ICD-10-CM

## 2016-11-19 NOTE — Telephone Encounter (Signed)
Caller name: Relationship to patient: Self Can be reached: [phone] Pharmacy:  Reason for call: Patient request to come in to have a Carbon Monoxide lab test done. States she feels she was exposed to carbon monoxide.

## 2016-11-21 NOTE — Telephone Encounter (Signed)
Patient calling again, requesting call back  °

## 2016-11-22 ENCOUNTER — Other Ambulatory Visit (INDEPENDENT_AMBULATORY_CARE_PROVIDER_SITE_OTHER): Payer: PRIVATE HEALTH INSURANCE

## 2016-11-22 DIAGNOSIS — R059 Cough, unspecified: Secondary | ICD-10-CM

## 2016-11-22 DIAGNOSIS — Z7729 Contact with and (suspected ) exposure to other hazardous substances: Secondary | ICD-10-CM

## 2016-11-22 DIAGNOSIS — R05 Cough: Secondary | ICD-10-CM

## 2016-11-22 NOTE — Telephone Encounter (Signed)
Patient made aware she will come in for a blood test Per dr. Charlett Blake     [facility]

## 2016-11-23 LAB — SPECIMEN STATUS REPORT

## 2016-11-23 LAB — CARBON MONOXIDE, BLOOD (PERFORMED AT REF LAB): CARBON MONOXIDE, BLOOD: 2.9 % (ref 0.0–3.6)

## 2016-12-17 MED FILL — CITALOPRAM HBR 20 MG TABLET: 20 | 34 days supply | Qty: 34 | Fill #2

## 2016-12-17 MED FILL — ATORVASTATIN 40 MG TABLET: 40 | 30 days supply | Qty: 30 | Fill #1

## 2016-12-17 MED FILL — CLOBETASOL 0.05% SOLUTION: 0.05 | 14 days supply | Qty: 25 | Fill #1

## 2016-12-17 MED FILL — LOSARTAN POTASSIUM 100 MG T: 100 | 30 days supply | Qty: 30 | Fill #1

## 2017-01-15 MED FILL — CITALOPRAM HBR 20 MG TABLET: 20 | 30 days supply | Qty: 30 | Fill #3

## 2017-01-15 MED FILL — LOSARTAN POTASSIUM 100 MG T: 100 | 30 days supply | Qty: 30 | Fill #2

## 2017-01-15 MED FILL — ATORVASTATIN 40 MG TABLET: 40 | 30 days supply | Qty: 30 | Fill #2

## 2017-01-16 ENCOUNTER — Encounter (HOSPITAL_BASED_OUTPATIENT_CLINIC_OR_DEPARTMENT_OTHER): Payer: Self-pay

## 2017-01-16 ENCOUNTER — Emergency Department (HOSPITAL_BASED_OUTPATIENT_CLINIC_OR_DEPARTMENT_OTHER)
Admission: EM | Admit: 2017-01-16 | Discharge: 2017-01-16 | Disposition: A | Discharge status: Home / Self Care | Payer: No Typology Code available for payment source | Attending: Emergency Medicine | Admitting: Emergency Medicine

## 2017-01-16 DIAGNOSIS — Y929 Unspecified place or not applicable: Secondary | ICD-10-CM | POA: Diagnosis not present

## 2017-01-16 DIAGNOSIS — Z87891 Personal history of nicotine dependence: Secondary | ICD-10-CM | POA: Insufficient documentation

## 2017-01-16 DIAGNOSIS — Z79899 Other long term (current) drug therapy: Secondary | ICD-10-CM | POA: Insufficient documentation

## 2017-01-16 DIAGNOSIS — Y939 Activity, unspecified: Secondary | ICD-10-CM | POA: Insufficient documentation

## 2017-01-16 DIAGNOSIS — I10 Essential (primary) hypertension: Secondary | ICD-10-CM | POA: Insufficient documentation

## 2017-01-16 DIAGNOSIS — W19XXXA Unspecified fall, initial encounter: Secondary | ICD-10-CM | POA: Diagnosis not present

## 2017-01-16 DIAGNOSIS — S6991XA Unspecified injury of right wrist, hand and finger(s), initial encounter: Secondary | ICD-10-CM | POA: Diagnosis present

## 2017-01-16 DIAGNOSIS — Y999 Unspecified external cause status: Secondary | ICD-10-CM | POA: Insufficient documentation

## 2017-01-16 DIAGNOSIS — S52501A Unspecified fracture of the lower end of right radius, initial encounter for closed fracture: Secondary | ICD-10-CM | POA: Insufficient documentation

## 2017-01-16 MED ORDER — TRAMADOL HCL 50 MG PO TABS: 50.0000 mg | ORAL_TABLET | Freq: Four times a day (QID) | ORAL | 0 refills | Status: DC | PRN

## 2017-01-16 MED FILL — traMADol HCL 50 MG TABS: 50 | 3 days supply | Qty: 15 | Fill #0

## 2017-01-16 NOTE — ED Notes (Signed)
ED Provider at bedside discussing plan of care. 

## 2017-01-16 NOTE — ED Triage Notes (Signed)
Pt states she fell/injured right wrist this am-was seen at [facility] and sent to ED for "fx wrist"-pt has velcro splint in place-NAD-steady gait

## 2017-01-16 NOTE — ED Provider Notes (Signed)
[facility] DEPT [facility] Provider Note   CSN: [id number] Arrival date & time: 01/16/17  1402     History   Chief Complaint Chief Complaint  Patient presents with  . Wrist Injury    HPI Latoya [name] is a 61 y.o. female.  HPI Patient with fall onto extended right hand earlier this morning. Went to urgent care complaining of right wrist pain. Denies any other injury. Had x-rays performed and wrist splint placed. Was referred to the emergency department. Patient denies any numbness or tingling in her fingers. Past Medical History:  Diagnosis Date  . Anxiety   . Back pain   . Cancer ([place]) 12/2009   BCC, SCC  . Cervical cancer screening 12/30/2012  . Dermatitis 12/03/2007   Qualifier: Diagnosis of  By: Jerold Coombe    . Eczema 01/04/2013  . Environmental allergies   . Fatty liver   . Hyperlipidemia   . Hypertension   . Morbid obesity ([place]) 01/13/2012  . Multiple food allergies    cashew chicken, chinese, msg?  . Obesity 01/13/2012  . Overweight 01/13/2012  . Pain of toe of left foot 08/23/2014  . Preventative health care 01/04/2013  . Vitamin D deficiency     Patient Active Problem List   Diagnosis Date Noted  . Vitamin D deficiency 07/17/2016  . 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 Class 1 obesity without serious comorbidity with body mass index (BMI) of 30.0 to 30.9 in adult 07/17/2016  . Sinusitis 06/26/2015  . Cough 06/26/2015  . Pain of toe of left foot 08/23/2014  . Hot flashes 02/14/2014  . Vitamin B12 deficiency 02/14/2014  . Anxiety 04/01/2013  . Eczema 01/04/2013  . Preventative health care 01/04/2013  . Cervical cancer screening 12/30/2012  . Varicose veins of lower extremities with other complications 83/25/4982  . Insomnia 01/13/2012  . Obesity 01/13/2012  . SKIN CANCER, HX OF 09/26/2009  . Hyperlipidemia, mixed 04/23/2007  . Essential hypertension 04/23/2007    Past Surgical History:  Procedure Laterality Date  . dilation and currettage     lifestyle  lift  . MOHS SURGERY  12/2009   basal cell     OB History    Gravida Para Term Preterm AB Living   2 2 2     2    SAB TAB Ectopic Multiple Live Births                   Home Medications    Prior to Admission medications   Medication Sig Start Date End Date Taking? Authorizing Provider  albuterol (VENTOLIN HFA) 108 (90 Base) MCG/ACT inhaler Inhale 2 puffs into the lungs every 6 (six) hours as needed for wheezing or shortness of breath. 05/15/16   Mosie Lukes, MD  ALPRAZolam Duanne Moron) 0.25 MG tablet Take 1-2 tablets (0.25-0.5 mg total) by mouth at bedtime as needed for sleep or anxiety. 11/13/16   Mosie Lukes, MD  atorvastatin (LIPITOR) 40 MG tablet Take 1 tablet (40 mg total) by mouth daily at 6 PM. 11/13/16   Mosie Lukes, MD  beclomethasone (QVAR) 40 MCG/ACT inhaler Inhale 2 puffs into the lungs 2 (two) times daily at 10 AM and 5 PM. Patient not taking: Reported on 11/13/2016 01/27/16   Saguier, Percell Miller, PA-C  cetirizine (ZYRTEC) 10 MG tablet Take 1 tablet (10 mg total) by mouth 2 (two) times daily. 11/13/16   Mosie Lukes, MD  citalopram (CELEXA) 20 MG tablet Take 1 tablet (20 mg total) by mouth daily.  05/15/16   Mosie Lukes, MD  clobetasol (TEMOVATE) 0.05 % external solution Apply 1 application topically 2 (two) times daily. 11/13/16   Mosie Lukes, MD  fluticasone (FLONASE) 50 MCG/ACT nasal spray Administer 2 sprays in each nostril daily for 30 days. 09/05/16 10/05/16  Starlyn Skeans, MD  Krill Oil 500 MG CAPS Take 2 capsules by mouth every morning.    [provider]  losartan (COZAAR) 100 MG tablet TAKE 1 TABLET (100 MG) BY MOUTH DAILY. 11/13/16   Mosie Lukes, MD  ranitidine (ZANTAC) 150 MG tablet Take 1 tablet (150 mg total) by mouth 2 (two) times daily. 11/13/16   Mosie Lukes, MD  traMADol (ULTRAM) 50 MG tablet Take 1 tablet (50 mg total) by mouth every 6 (six) hours as needed. 01/16/17   Julianne Rice, MD    Family History Family History  Problem  Relation Age of Onset  . Heart disease Mother   . Diabetes Mother   . Hypertension Mother   . Hyperlipidemia Mother   . Heart Problems Mother   . Heart disease Father        pacer  . Diabetes Father   . Hypertension Father        weight controlled  . Obesity Father   . Hyperlipidemia Father   . Heart Problems Father   . Cancer Father   . Obesity Sister        s/p lap band  . Cancer Maternal Grandmother        pancreatic  . Cancer Paternal Grandmother 60       liver  . Psoriasis Brother   . Obesity Brother   . Obesity Brother   . Diabetes Brother        diet controlled  . Melanoma Unknown   . Colon cancer Neg Hx     Social History Social History  Substance Use Topics  . Smoking status: Former Research scientist (life sciences)  . Smokeless tobacco: Never Used     Comment: quit 2008  . Alcohol use Yes     Comment: weekly     Allergies   Monosodium glutamate   Review of Systems Review of Systems  Musculoskeletal: Positive for arthralgias.  Skin: Negative for rash and wound.  Neurological: Negative for weakness and numbness.  All other systems reviewed and are negative.    Physical Exam Updated Vital Signs BP 131/77 (BP Location: Left Arm)   Pulse 93   Temp 98.9 F (37.2 C) (Oral)   Resp 18   Ht 5\' 8"  (1.727 m)   Wt 86.8 kg (191 lb 5.8 oz)   LMP  (LMP Unknown) Comment: 2012 ?  SpO2 96%   BMI 29.10 kg/m   Physical Exam  Constitutional: She is oriented to person, place, and time. She appears well-developed and well-nourished. No distress.  HENT:  Head: Normocephalic and atraumatic.  Eyes: Pupils are equal, round, and reactive to light.  Neck: Normal range of motion. Neck supple.  Cardiovascular: Normal rate.   Pulmonary/Chest: Effort normal.  Abdominal: Soft.  Musculoskeletal: Normal range of motion. She exhibits tenderness. She exhibits no edema.  Very mild distal radius tenderness at the level of the wrist joint. No snuffbox tenderness. No obvious deformity. Good distal  cap refill.  Neurological: She is alert and oriented to person, place, and time.  Normal bilateral grip strength. Normal intrinsic strength in bilateral hands. Sensation fully intact.  Skin: Skin is warm and dry. No rash noted. No erythema.  Psychiatric:  She has a normal mood and affect. Her behavior is normal.  Nursing note and vitals reviewed.    ED Treatments / Results  Labs (all labs ordered are listed, but only abnormal results are displayed) Labs Reviewed - No data to display  EKG  EKG Interpretation None       Radiology No results found.  Procedures Procedures (including critical care time)  Medications Ordered in ED Medications - No data to display   Initial Impression / Assessment and Plan / ED Course  I have reviewed the triage vital signs and the nursing notes.  Pertinent labs & imaging results that were available during my care of the patient were reviewed by me and considered in my medical decision making (see chart for details).     Patient proceeded from outside facility. Images were reviewed. Revealed nondisplaced distal radius fracture with fracture line extending into the joint space. She is immobilized in a wrist splint. Will give referral to hand surgery. She's been informed to take CD with her.  Final Clinical Impressions(s) / ED Diagnoses   Final diagnoses:  Closed fracture of distal end of right radius, unspecified fracture morphology, initial encounter    New Prescriptions New Prescriptions   TRAMADOL (ULTRAM) 50 MG TABLET    Take 1 tablet (50 mg total) by mouth every 6 (six) hours as needed.     Julianne Rice, MD 01/16/17 [phone]

## 2017-02-19 MED FILL — CITALOPRAM HBR 20 MG TABLET: 20 | 30 days supply | Qty: 30 | Fill #4

## 2017-02-19 MED FILL — ATORVASTATIN 40 MG TABLET: 40 | 30 days supply | Qty: 30 | Fill #3

## 2017-02-19 MED FILL — CLOBETASOL 0.05% SOLUTION: 0.05 | 14 days supply | Qty: 25 | Fill #2

## 2017-02-19 MED FILL — LOSARTAN POTASSIUM 100 MG T: 100 | 30 days supply | Qty: 30 | Fill #3

## 2017-02-19 MED FILL — ALPRAZolam 0.25 MG TABS: 0.25 | 10 days supply | Qty: 20 | Fill #1

## 2017-03-21 MED FILL — LOSARTAN POTASSIUM 100 MG T: 100 | 30 days supply | Qty: 30 | Fill #4

## 2017-03-21 MED FILL — ATORVASTATIN 40 MG TABLET: 40 | 30 days supply | Qty: 30 | Fill #4

## 2017-03-22 MED FILL — CITALOPRAM HBR 20 MG TABLET: 20 | 18 days supply | Qty: 18 | Fill #5

## 2017-03-29 ENCOUNTER — Other Ambulatory Visit: Payer: Self-pay

## 2017-03-29 DIAGNOSIS — F419 Anxiety disorder, unspecified: Principal | ICD-10-CM

## 2017-03-29 DIAGNOSIS — F329 Major depressive disorder, single episode, unspecified: Secondary | ICD-10-CM

## 2017-03-29 DIAGNOSIS — F32A Depression, unspecified: Secondary | ICD-10-CM

## 2017-03-29 MED ORDER — CITALOPRAM HYDROBROMIDE 20 MG PO TABS: 20.0000 mg | ORAL_TABLET | Freq: Every day | ORAL | 1 refills | Status: DC

## 2017-04-24 MED FILL — VENTOLIN HFA 90 MCG INHALER: 108 (90 BAS | 30 days supply | Qty: 18 | Fill #1

## 2017-04-24 MED FILL — ATORVASTATIN 40 MG TABLET: 40 | 30 days supply | Qty: 30 | Fill #5

## 2017-04-24 MED FILL — LOSARTAN POTASSIUM 100 MG T: 100 | 30 days supply | Qty: 30 | Fill #5

## 2017-05-17 MED FILL — LOSARTAN POTASSIUM 100 MG T: 100 | 30 days supply | Qty: 30 | Fill #6

## 2017-05-17 MED FILL — ATORVASTATIN 40 MG TABLET: 40 | 30 days supply | Qty: 30 | Fill #6

## 2017-06-19 MED FILL — ATORVASTATIN 40 MG TABLET: 40 | 30 days supply | Qty: 30 | Fill #7

## 2017-06-19 MED FILL — LOSARTAN POTASSIUM 100 MG T: 100 | 30 days supply | Qty: 30 | Fill #7

## 2017-07-24 MED FILL — ATORVASTATIN 40 MG TABLET: 40 | 30 days supply | Qty: 30 | Fill #8

## 2017-07-24 MED FILL — LOSARTAN POTASSIUM 100 MG T: 100 | 30 days supply | Qty: 30 | Fill #8

## 2017-08-22 ENCOUNTER — Other Ambulatory Visit: Payer: Self-pay | Admitting: Family Medicine

## 2017-08-22 DIAGNOSIS — I10 Essential (primary) hypertension: Secondary | ICD-10-CM

## 2017-08-22 MED FILL — ATORVASTATIN 40 MG TABLET: 40 | 30 days supply | Qty: 30 | Fill #9 | Status: TO

## 2017-08-22 MED FILL — LOSARTAN POTASSIUM 100 MG T: 100 | 30 days supply | Qty: 30 | Fill #0 | Status: TO

## 2017-08-27 MED FILL — CLOBETASOL 0.05% SOLUTION: 0.05 | 14 days supply | Qty: 25 | Fill #3

## 2017-10-11 ENCOUNTER — Other Ambulatory Visit: Payer: Self-pay | Admitting: Family Medicine

## 2017-10-11 DIAGNOSIS — F329 Major depressive disorder, single episode, unspecified: Secondary | ICD-10-CM

## 2017-10-11 DIAGNOSIS — F419 Anxiety disorder, unspecified: Principal | ICD-10-CM

## 2017-11-12 ENCOUNTER — Other Ambulatory Visit: Payer: Self-pay | Admitting: Family Medicine

## 2017-11-12 DIAGNOSIS — F329 Major depressive disorder, single episode, unspecified: Secondary | ICD-10-CM

## 2017-11-12 DIAGNOSIS — F419 Anxiety disorder, unspecified: Principal | ICD-10-CM

## 2017-11-22 ENCOUNTER — Other Ambulatory Visit: Payer: Self-pay | Admitting: Family Medicine

## 2017-11-22 DIAGNOSIS — E785 Hyperlipidemia, unspecified: Secondary | ICD-10-CM

## 2017-11-22 NOTE — Telephone Encounter (Signed)
Copied from [place] [id number]. Topic: Quick Communication - Rx Refill/Question >> Nov 22, 2017  4:17 PM Doaa, [place] wrote: Medication:  atorvastatin (LIPITOR) 40 MG tablet  She will need it by Monday. Will be out   Has the patient contacted their pharmacy? Yes.   (Agent: If no, request that the patient contact the pharmacy for the refill.) (Agent: If yes, when and what did the pharmacy advise?)  Preferred Pharmacy (with phone number or street name):   [facility] [address] [place], [place] - [address] [place] [address] Phone: [phone] Fax: [phone]    Agent: Please be advised that RX refills may take up to 3 business days. We ask that you follow-up with your pharmacy.

## 2017-11-22 NOTE — Telephone Encounter (Signed)
Patient called, left VM to call the office to schedule a yearly physical appointment with Dr. Charlett Blake.  Atorvastatin refill Last OV:11/13/16 Last refill:11/22/16 90 tab/3 refill [facility]: [facility] [place], [place] - [address] [phone] (Phone) [phone] (Fax)

## 2017-11-25 MED ORDER — ATORVASTATIN CALCIUM 40 MG PO TABS
40.0000 mg | ORAL_TABLET | Freq: Every day | ORAL | 0 refills | Status: DC
Start: 2017-11-25 — End: 2017-12-20

## 2017-11-25 NOTE — Telephone Encounter (Signed)
Pt. requesting atorvastatin refill; last OV with lipid profile 11/13/16. Refill placed, but limited supply given due to patient's need to make a follow-up appointment. RN from [facility] already reached out to pt., leaving VM advising to call to make an appointment.

## 2017-12-11 ENCOUNTER — Telehealth: Payer: Self-pay | Admitting: Medical

## 2017-12-11 ENCOUNTER — Ambulatory Visit (INDEPENDENT_AMBULATORY_CARE_PROVIDER_SITE_OTHER): Payer: PRIVATE HEALTH INSURANCE | Admitting: Medical

## 2017-12-11 ENCOUNTER — Encounter: Payer: Self-pay | Admitting: Medical

## 2017-12-11 VITALS — BP 120/77 | HR 78 | Temp 98.0°F | Resp 16 | Ht 68.0 in | Wt 192.6 lb

## 2017-12-11 DIAGNOSIS — Z0000 Encounter for general adult medical examination without abnormal findings: Secondary | ICD-10-CM

## 2017-12-11 DIAGNOSIS — Z1239 Encounter for other screening for malignant neoplasm of breast: Secondary | ICD-10-CM

## 2017-12-11 LAB — POC URINALSYSI DIPSTICK (AUTOMATED)
BILIRUBIN UA: NEGATIVE
Blood, UA: NEGATIVE
GLUCOSE UA: NEGATIVE
KETONES UA: NEGATIVE
Leukocytes, UA: NEGATIVE
Nitrite, UA: NEGATIVE
Protein, UA: NEGATIVE
Spec Grav, UA: 1.015 (ref 1.010–1.025)
Urobilinogen, UA: NEGATIVE E.U./dL — AB
pH, UA: 6 (ref 5.0–8.0)

## 2017-12-11 NOTE — Addendum Note (Signed)
Addended by: Hinton Dyer on: 12/11/2017 01:12 PM   Modules accepted: Orders

## 2017-12-11 NOTE — Patient Instructions (Addendum)
For you wellness exam today I have ordered cbc, cmp, lipid panel, and ua  Vaccine appear up to date.   Screening mammo order placed.  Recommend exercise and healthy diet.  We will let you know lab results as they come in.  Follow up date appointment will be determined after lab review.    Preventive Care 40-64 Years, Female Preventive care refers to lifestyle choices and visits with your health care provider that can promote health and wellness. What does preventive care include?  A yearly physical exam. This is also called an annual well check.  Dental exams once or twice a year.  Routine eye exams. Ask your health care provider how often you should have your eyes checked.  Personal lifestyle choices, including: ? Daily care of your teeth and gums. ? Regular physical activity. ? Eating a healthy diet. ? Avoiding tobacco and drug use. ? Limiting alcohol use. ? Practicing safe sex. ? Taking low-dose aspirin daily starting at age 61. ? Taking vitamin and mineral supplements as recommended by your health care provider. What happens during an annual well check? The services and screenings done by your health care provider during your annual well check will depend on your age, overall health, lifestyle risk factors, and family history of disease. Counseling Your health care provider may ask you questions about your:  Alcohol use.  Tobacco use.  Drug use.  Emotional well-being.  Home and relationship well-being.  Sexual activity.  Eating habits.  Work and work Statistician.  Method of birth control.  Menstrual cycle.  Pregnancy history.  Screening You may have the following tests or measurements:  Height, weight, and BMI.  Blood pressure.  Lipid and cholesterol levels. These may be checked every 5 years, or more frequently if you are over 34 years old.  Skin check.  Lung cancer screening. You may have this screening every year starting at age 68 if you  have a 30-pack-year history of smoking and currently smoke or have quit within the past 15 years.  Fecal occult blood test (FOBT) of the stool. You may have this test every year starting at age 34.  Flexible sigmoidoscopy or colonoscopy. You may have a sigmoidoscopy every 5 years or a colonoscopy every 10 years starting at age 60.  Hepatitis C blood test.  Hepatitis B blood test.  Sexually transmitted disease (STD) testing.  Diabetes screening. This is done by checking your blood sugar (glucose) after you have not eaten for a while (fasting). You may have this done every 1-3 years.  Mammogram. This may be done every 1-2 years. Talk to your health care provider about when you should start having regular mammograms. This may depend on whether you have a family history of breast cancer.  BRCA-related cancer screening. This may be done if you have a family history of breast, ovarian, tubal, or peritoneal cancers.  Pelvic exam and Pap test. This may be done every 3 years starting at age 29. Starting at age 40, this may be done every 5 years if you have a Pap test in combination with an HPV test.  Bone density scan. This is done to screen for osteoporosis. You may have this scan if you are at high risk for osteoporosis.  Discuss your test results, treatment options, and if necessary, the need for more tests with your health care provider. Vaccines Your health care provider may recommend certain vaccines, such as:  Influenza vaccine. This is recommended every year.  Tetanus, diphtheria, and  acellular pertussis (Tdap, Td) vaccine. You may need a Td booster every 10 years.  Varicella vaccine. You may need this if you have not been vaccinated.  Zoster vaccine. You may need this after age 28.  Measles, mumps, and rubella (MMR) vaccine. You may need at least one dose of MMR if you were born in 1957 or later. You may also need a second dose.  Pneumococcal 13-valent conjugate (PCV13) vaccine.  You may need this if you have certain conditions and were not previously vaccinated.  Pneumococcal polysaccharide (PPSV23) vaccine. You may need one or two doses if you smoke cigarettes or if you have certain conditions.  Meningococcal vaccine. You may need this if you have certain conditions.  Hepatitis A vaccine. You may need this if you have certain conditions or if you travel or work in places where you may be exposed to hepatitis A.  Hepatitis B vaccine. You may need this if you have certain conditions or if you travel or work in places where you may be exposed to hepatitis B.  Haemophilus influenzae type b (Hib) vaccine. You may need this if you have certain conditions.  Talk to your health care provider about which screenings and vaccines you need and how often you need them. This information is not intended to replace advice given to you by your health care provider. Make sure you discuss any questions you have with your health care provider. Document Released: 06/10/2015 Document Revised: 02/01/2016 Document Reviewed: 03/15/2015 Elsevier Interactive Patient Education  [employer].

## 2017-12-11 NOTE — Progress Notes (Signed)
Subjective:    Patient ID: Latoya [name], female    DOB: 06-02-1955, 62 y.o.   MRN: [id number]  HPI  Pt in for CPE.  She wants physical exam and labs that go along with it. She only drank coffee.   Pt states recent diet has been healthy. Pt states has been walking her dog daily.   Vaccines do look up to date.  In 2018 pt had diagnostic mammogram. Pt has not been back.  Pt thinks last wellness with pcp had pap. She states was normal.      Review of Systems  Constitutional: Negative for chills, fatigue and fever.  HENT: Negative for congestion, ear discharge, ear pain, postnasal drip, sinus pressure and sore throat.   Respiratory: Negative for cough, chest tightness, shortness of breath and wheezing.   Cardiovascular: Negative for chest pain and palpitations.  Gastrointestinal: Negative for abdominal pain, constipation, diarrhea, nausea and vomiting.  Genitourinary: Negative for dysuria and flank pain.  Musculoskeletal: Negative for back pain.  Skin: Negative for rash.  Neurological: Negative for dizziness, syncope, weakness, light-headedness and headaches.  Hematological: Negative for adenopathy. Does not bruise/bleed easily.  Psychiatric/Behavioral: Negative for behavioral problems, dysphoric mood and suicidal ideas. The patient is not nervous/anxious.     Past Medical History:  Diagnosis Date  . Anxiety   . Back pain   . Cancer ([place]) 12/2009   BCC, SCC  . Cervical cancer screening 12/30/2012  . Dermatitis 12/03/2007   Qualifier: Diagnosis of  By: Jerold Coombe    . Eczema 01/04/2013  . Environmental allergies   . Fatty liver   . Hyperlipidemia   . Hypertension   . Morbid obesity ([place]) 01/13/2012  . Multiple food allergies    cashew chicken, chinese, msg?  . Obesity 01/13/2012  . Overweight 01/13/2012  . Pain of toe of left foot 08/23/2014  . Preventative health care 01/04/2013  . Vitamin D deficiency      Social History   Socioeconomic History  . Marital  status: Single    Spouse name: Not on file  . Number of children: Not on file  . Years of education: Not on file  . Highest education level: Not on file  Occupational History  . Occupation: self Leisure centre manager: SELF-EMPLOYED  Social Needs  . Financial resource strain: Not on file  . Food insecurity:    Worry: Not on file    Inability: Not on file  . Transportation needs:    Medical: Not on file    Non-medical: Not on file  Tobacco Use  . Smoking status: Former Research scientist (life sciences)  . Smokeless tobacco: Never Used  . Tobacco comment: quit 2008  Substance and Sexual Activity  . Alcohol use: Yes    Comment: weekly  . Drug use: No  . Sexual activity: Not Currently    Partners: Male    Comment: lives ALONE. no dietary restrictions, works full time Insurance underwriter  Lifestyle  . Physical activity:    Days per week: Not on file    Minutes per session: Not on file  . Stress: Not on file  Relationships  . Social connections:    Talks on phone: Not on file    Gets together: Not on file    Attends religious service: Not on file    Active member of club or organization: Not on file    Attends meetings of clubs or organizations: Not on file    Relationship status: Not on file  .  Intimate partner violence:    Fear of current or ex partner: Not on file    Emotionally abused: Not on file    Physically abused: Not on file    Forced sexual activity: Not on file  Other Topics Concern  . Not on file  Social History Narrative  . Not on file    Past Surgical History:  Procedure Laterality Date  . dilation and currettage     lifestyle lift  . MOHS SURGERY  12/2009   basal cell     Family History  Problem Relation Age of Onset  . Heart disease Mother   . Diabetes Mother   . Hypertension Mother   . Hyperlipidemia Mother   . Heart Problems Mother   . Heart disease Father        pacer  . Diabetes Father   . Hypertension Father        weight controlled  . Obesity Father   .  Hyperlipidemia Father   . Heart Problems Father   . Cancer Father   . Obesity Sister        s/p lap band  . Cancer Maternal Grandmother        pancreatic  . Cancer Paternal Grandmother 30       liver  . Psoriasis Brother   . Obesity Brother   . Obesity Brother   . Diabetes Brother        diet controlled  . Melanoma Unknown   . Colon cancer Neg Hx     Allergies  Allergen Reactions  . Monosodium Glutamate Hives    Cashew Chicken    Current Outpatient Medications on File Prior to Visit  Medication Sig Dispense Refill  . albuterol (VENTOLIN HFA) 108 (90 Base) MCG/ACT inhaler Inhale 2 puffs into the lungs every 6 (six) hours as needed for wheezing or shortness of breath. 1 Inhaler 3  . atorvastatin (LIPITOR) 40 MG tablet Take 1 tablet (40 mg total) by mouth daily at 6 PM. 90 tablet 0  . cetirizine (ZYRTEC) 10 MG tablet Take 1 tablet (10 mg total) by mouth 2 (two) times daily. 30 tablet 11  . citalopram (CELEXA) 20 MG tablet TAKE 1 TABLET BY MOUTH DAILY 30 tablet 0  . clobetasol (TEMOVATE) 0.05 % external solution Apply 1 application topically 2 (two) times daily. 50 mL 1  . Krill Oil 500 MG CAPS Take 2 capsules by mouth every morning.    Marland Kitchen losartan (COZAAR) 100 MG tablet TAKE 1 TABLET (100 MG) BY MOUTH DAILY. 90 tablet 2  . ALPRAZolam (XANAX) 0.25 MG tablet Take 1-2 tablets (0.25-0.5 mg total) by mouth at bedtime as needed for sleep or anxiety. (Patient not taking: Reported on 12/11/2017) 20 tablet 1  . beclomethasone (QVAR) 40 MCG/ACT inhaler Inhale 2 puffs into the lungs 2 (two) times daily at 10 AM and 5 PM. (Patient not taking: Reported on 12/11/2017) 1 Inhaler 2  . fluticasone (FLONASE) 50 MCG/ACT nasal spray Administer 2 sprays in each nostril daily for 30 days. 16 g 0  . ranitidine (ZANTAC) 150 MG tablet Take 1 tablet (150 mg total) by mouth 2 (two) times daily. (Patient not taking: Reported on 12/11/2017)     No current facility-administered medications on file prior to visit.       BP 120/77   Pulse 78   Temp 98 F (36.7 C) (Oral)   Resp 16   Ht 5\' 8"  (1.727 m)   Wt 192 lb 9.6 oz (87.4  kg)   LMP  (LMP Unknown) Comment: 2012 ?  SpO2 98%   BMI 29.28 kg/m       Objective:   Physical Exam  General Mental Status- Alert. General Appearance- Not in acute distress.   Skin General: Color- Normal Color. Moisture- Normal Moisture.(but no inspection done today) deferred by pt.(gets regular checks daugter derm PA and daughter in law derm PA)   Neck Carotid Arteries- Normal color. Moisture- Normal Moisture. No carotid bruits. No JVD.  Chest and Lung Exam Auscultation: Breath Sounds:-Normal.  Cardiovascular Auscultation:Rythm- Regular. Murmurs & Other Heart Sounds:Auscultation of the heart reveals- No Murmurs.  Abdomen Inspection:-Inspeection Normal. Palpation/Percussion:Note:No mass. Palpation and Percussion of the abdomen reveal- Non Tender, Non Distended + BS, no rebound or guarding.   Neurologic Cranial Nerve exam:- CN III-XII intact(No nystagmus), symmetric smile. Strength:- 5/5 equal and symmetric strength both upper and lower extremities.     Assessment & Plan:  Vaccine appear up to date.   Screening mammo order placed.  Recommend exercise and healthy diet.  We will let you know lab results as they come in.  Follow up date appointment will be determined after lab review.   Mackie Pai, PA-C

## 2017-12-11 NOTE — Telephone Encounter (Signed)
Had to take out poct urine to close chart. Will you result that/replace order.

## 2017-12-17 ENCOUNTER — Other Ambulatory Visit (INDEPENDENT_AMBULATORY_CARE_PROVIDER_SITE_OTHER): Payer: PRIVATE HEALTH INSURANCE

## 2017-12-17 ENCOUNTER — Ambulatory Visit (HOSPITAL_BASED_OUTPATIENT_CLINIC_OR_DEPARTMENT_OTHER)
Admission: RE | Admit: 2017-12-17 | Discharge: 2017-12-17 | Disposition: A | Discharge status: Home / Self Care | Payer: PRIVATE HEALTH INSURANCE | Source: Ambulatory Visit | Attending: Medical | Admitting: Medical

## 2017-12-17 ENCOUNTER — Encounter (HOSPITAL_BASED_OUTPATIENT_CLINIC_OR_DEPARTMENT_OTHER): Payer: Self-pay

## 2017-12-17 DIAGNOSIS — Z0000 Encounter for general adult medical examination without abnormal findings: Secondary | ICD-10-CM | POA: Diagnosis not present

## 2017-12-17 DIAGNOSIS — Z1231 Encounter for screening mammogram for malignant neoplasm of breast: Secondary | ICD-10-CM | POA: Insufficient documentation

## 2017-12-17 DIAGNOSIS — Z1239 Encounter for other screening for malignant neoplasm of breast: Secondary | ICD-10-CM

## 2017-12-17 LAB — CBC WITH DIFFERENTIAL/PLATELET
BASOS ABS: 0.1 10*3/uL (ref 0.0–0.1)
Basophils Relative: 1.9 % (ref 0.0–3.0)
EOS ABS: 0.5 10*3/uL (ref 0.0–0.7)
Eosinophils Relative: 11.3 % — ABNORMAL HIGH (ref 0.0–5.0)
HCT: 39.1 % (ref 36.0–46.0)
Hemoglobin: 13.1 g/dL (ref 12.0–15.0)
LYMPHS ABS: 1.3 10*3/uL (ref 0.7–4.0)
Lymphocytes Relative: 28.4 % (ref 12.0–46.0)
MCHC: 33.4 g/dL (ref 30.0–36.0)
MCV: 101.3 fl — ABNORMAL HIGH (ref 78.0–100.0)
MONO ABS: 0.4 10*3/uL (ref 0.1–1.0)
Monocytes Relative: 9.4 % (ref 3.0–12.0)
Neutro Abs: 2.2 10*3/uL (ref 1.4–7.7)
Neutrophils Relative %: 49 % (ref 43.0–77.0)
Platelets: 219 10*3/uL (ref 150.0–400.0)
RBC: 3.86 Mil/uL — AB (ref 3.87–5.11)
RDW: 15.1 % (ref 11.5–15.5)
WBC: 4.4 10*3/uL (ref 4.0–10.5)

## 2017-12-17 LAB — LIPID PANEL
CHOLESTEROL: 158 mg/dL (ref 0–200)
HDL: 76.6 mg/dL (ref >39.00)
LDL Cholesterol: 61 mg/dL (ref 0–99)
NonHDL: 81.05
TRIGLYCERIDES: 99 mg/dL (ref 0.0–149.0)
Total CHOL/HDL Ratio: 2
VLDL: 19.8 mg/dL (ref 0.0–40.0)

## 2017-12-17 LAB — COMPREHENSIVE METABOLIC PANEL
ALT: 53 U/L — AB (ref 0–35)
AST: 49 U/L — AB (ref 0–37)
Albumin: 4.4 g/dL (ref 3.5–5.2)
Alkaline Phosphatase: 72 U/L (ref 39–117)
BILIRUBIN TOTAL: 1 mg/dL (ref 0.2–1.2)
BUN: 15 mg/dL (ref 6–23)
CALCIUM: 9.4 mg/dL (ref 8.4–10.5)
CO2: 28 mEq/L (ref 19–32)
CREATININE: 1.02 mg/dL (ref 0.40–1.20)
Chloride: 100 mEq/L (ref 96–112)
GFR: 58.23 mL/min — ABNORMAL LOW (ref >60.00)
Glucose, Bld: 97 mg/dL (ref 70–99)
Potassium: 3.8 mEq/L (ref 3.5–5.1)
Sodium: 139 mEq/L (ref 135–145)
TOTAL PROTEIN: 7.3 g/dL (ref 6.0–8.3)

## 2017-12-20 ENCOUNTER — Other Ambulatory Visit: Payer: Self-pay

## 2017-12-20 ENCOUNTER — Telehealth: Payer: Self-pay | Admitting: Family Medicine

## 2017-12-20 DIAGNOSIS — F329 Major depressive disorder, single episode, unspecified: Secondary | ICD-10-CM

## 2017-12-20 DIAGNOSIS — F419 Anxiety disorder, unspecified: Secondary | ICD-10-CM

## 2017-12-20 DIAGNOSIS — E785 Hyperlipidemia, unspecified: Secondary | ICD-10-CM

## 2017-12-20 DIAGNOSIS — I10 Essential (primary) hypertension: Secondary | ICD-10-CM

## 2017-12-20 MED ORDER — CITALOPRAM HYDROBROMIDE 20 MG PO TABS: 20.0000 mg | ORAL_TABLET | Freq: Every day | ORAL | 0 refills | Status: DC

## 2017-12-20 MED ORDER — ATORVASTATIN CALCIUM 40 MG PO TABS: 40.0000 mg | ORAL_TABLET | Freq: Every day | ORAL | 0 refills | Status: DC

## 2017-12-20 NOTE — Telephone Encounter (Signed)
Copied from [place] [id number]. Topic: Quick Communication - Rx Refill/Question >> Dec 20, 2017  4:15 PM Waldemar Dickens, Sade R wrote: Medication: citalopram (CELEXA) 20 MG tablet , atorvastatin (LIPITOR) 40 MG tablet , losartan (COZAAR) 100 MG tablet  Has the patient contacted their pharmacy? Yes  Preferred Pharmacy (with phone number or street name): [facility] [address] [place], [place] - [address] [phone] (Phone) [phone] (Fax)      Agent: Please be advised that RX refills may take up to 3 business days. We ask that you follow-up with your pharmacy.

## 2017-12-23 MED ORDER — ATORVASTATIN CALCIUM 40 MG PO TABS: 40.0000 mg | ORAL_TABLET | Freq: Every day | ORAL | 0 refills | Status: DC

## 2017-12-23 MED ORDER — LOSARTAN POTASSIUM 100 MG PO TABS: 100.0000 mg | ORAL_TABLET | Freq: Every day | ORAL | 0 refills | Status: DC

## 2017-12-23 MED ORDER — CITALOPRAM HYDROBROMIDE 20 MG PO TABS: 20.0000 mg | ORAL_TABLET | Freq: Every day | ORAL | 0 refills | Status: DC

## 2017-12-23 NOTE — Telephone Encounter (Signed)
Sent in medications

## 2018-01-26 ENCOUNTER — Emergency Department (HOSPITAL_BASED_OUTPATIENT_CLINIC_OR_DEPARTMENT_OTHER): Payer: No Typology Code available for payment source

## 2018-01-26 ENCOUNTER — Other Ambulatory Visit: Payer: Self-pay

## 2018-01-26 ENCOUNTER — Encounter (HOSPITAL_BASED_OUTPATIENT_CLINIC_OR_DEPARTMENT_OTHER): Payer: Self-pay | Admitting: Emergency Medicine

## 2018-01-26 ENCOUNTER — Observation Stay (HOSPITAL_BASED_OUTPATIENT_CLINIC_OR_DEPARTMENT_OTHER)
Admission: EM | Admit: 2018-01-26 | Discharge: 2018-01-27 | Disposition: A | Discharge status: Home / Self Care | Payer: No Typology Code available for payment source | Attending: Internal Medicine | Admitting: Internal Medicine

## 2018-01-26 DIAGNOSIS — F5101 Primary insomnia: Secondary | ICD-10-CM | POA: Diagnosis not present

## 2018-01-26 DIAGNOSIS — K760 Fatty (change of) liver, not elsewhere classified: Secondary | ICD-10-CM | POA: Insufficient documentation

## 2018-01-26 DIAGNOSIS — I10 Essential (primary) hypertension: Secondary | ICD-10-CM | POA: Insufficient documentation

## 2018-01-26 DIAGNOSIS — Z91018 Allergy to other foods: Secondary | ICD-10-CM | POA: Diagnosis not present

## 2018-01-26 DIAGNOSIS — Z800 Family history of malignant neoplasm of digestive organs: Secondary | ICD-10-CM | POA: Diagnosis not present

## 2018-01-26 DIAGNOSIS — F419 Anxiety disorder, unspecified: Secondary | ICD-10-CM | POA: Insufficient documentation

## 2018-01-26 DIAGNOSIS — Z9102 Food additives allergy status: Secondary | ICD-10-CM | POA: Diagnosis not present

## 2018-01-26 DIAGNOSIS — Z8249 Family history of ischemic heart disease and other diseases of the circulatory system: Secondary | ICD-10-CM | POA: Diagnosis not present

## 2018-01-26 DIAGNOSIS — J441 Chronic obstructive pulmonary disease with (acute) exacerbation: Secondary | ICD-10-CM | POA: Diagnosis not present

## 2018-01-26 DIAGNOSIS — Z85828 Personal history of other malignant neoplasm of skin: Secondary | ICD-10-CM | POA: Diagnosis not present

## 2018-01-26 DIAGNOSIS — Z6829 Body mass index (BMI) 29.0-29.9, adult: Secondary | ICD-10-CM | POA: Diagnosis not present

## 2018-01-26 DIAGNOSIS — Z79899 Other long term (current) drug therapy: Secondary | ICD-10-CM | POA: Diagnosis not present

## 2018-01-26 DIAGNOSIS — R0602 Shortness of breath: Secondary | ICD-10-CM

## 2018-01-26 DIAGNOSIS — R059 Cough, unspecified: Secondary | ICD-10-CM

## 2018-01-26 DIAGNOSIS — R0902 Hypoxemia: Secondary | ICD-10-CM | POA: Diagnosis present

## 2018-01-26 DIAGNOSIS — Z7951 Long term (current) use of inhaled steroids: Secondary | ICD-10-CM | POA: Insufficient documentation

## 2018-01-26 DIAGNOSIS — Z87891 Personal history of nicotine dependence: Secondary | ICD-10-CM | POA: Insufficient documentation

## 2018-01-26 DIAGNOSIS — R05 Cough: Secondary | ICD-10-CM | POA: Diagnosis present

## 2018-01-26 DIAGNOSIS — E782 Mixed hyperlipidemia: Secondary | ICD-10-CM | POA: Insufficient documentation

## 2018-01-26 DIAGNOSIS — G4700 Insomnia, unspecified: Secondary | ICD-10-CM | POA: Diagnosis not present

## 2018-01-26 DIAGNOSIS — J45909 Unspecified asthma, uncomplicated: Secondary | ICD-10-CM | POA: Insufficient documentation

## 2018-01-26 LAB — COMPREHENSIVE METABOLIC PANEL
ALT: 55 U/L — AB (ref 0–44)
AST: 53 U/L — AB (ref 15–41)
Albumin: 4.3 g/dL (ref 3.5–5.0)
Alkaline Phosphatase: 63 U/L (ref 38–126)
Anion gap: 15 (ref 5–15)
BUN: 12 mg/dL (ref 8–23)
CO2: 23 mmol/L (ref 22–32)
CREATININE: 0.7 mg/dL (ref 0.44–1.00)
Calcium: 8.7 mg/dL — ABNORMAL LOW (ref 8.9–10.3)
Chloride: 101 mmol/L (ref 98–111)
GFR calc Af Amer: >60 mL/min (ref >60)
Glucose, Bld: 105 mg/dL — ABNORMAL HIGH (ref 70–99)
Potassium: 3.9 mmol/L (ref 3.5–5.1)
Sodium: 139 mmol/L (ref 135–145)
TOTAL PROTEIN: 7.6 g/dL (ref 6.5–8.1)
Total Bilirubin: 0.6 mg/dL (ref 0.3–1.2)

## 2018-01-26 LAB — CBC WITH DIFFERENTIAL/PLATELET
Basophils Absolute: 0 10*3/uL (ref 0.0–0.1)
Basophils Relative: 0 %
EOS PCT: 3 %
Eosinophils Absolute: 0.3 10*3/uL (ref 0.0–0.7)
HEMATOCRIT: 38.8 % (ref 36.0–46.0)
HEMOGLOBIN: 13.1 g/dL (ref 12.0–15.0)
LYMPHS ABS: 2.3 10*3/uL (ref 0.7–4.0)
LYMPHS PCT: 21 %
MCH: 33.9 pg (ref 26.0–34.0)
MCHC: 33.8 g/dL (ref 30.0–36.0)
MCV: 100.3 fL — AB (ref 78.0–100.0)
Monocytes Absolute: 0.7 10*3/uL (ref 0.1–1.0)
Monocytes Relative: 7 %
NEUTROS ABS: 7.7 10*3/uL (ref 1.7–7.7)
Neutrophils Relative %: 69 %
PLATELETS: 231 10*3/uL (ref 150–400)
RBC: 3.87 MIL/uL (ref 3.87–5.11)
RDW: 13.8 % (ref 11.5–15.5)
WBC: 11 10*3/uL — ABNORMAL HIGH (ref 4.0–10.5)

## 2018-01-26 LAB — TROPONIN I
Troponin I: <0.03 ng/mL (ref <0.03)
Troponin I: <0.03 ng/mL (ref <0.03)

## 2018-01-26 LAB — BRAIN NATRIURETIC PEPTIDE: B Natriuretic Peptide: 128 pg/mL — ABNORMAL HIGH (ref 0.0–100.0)

## 2018-01-26 MED ORDER — IBUPROFEN 800 MG PO TABS
800.0000 mg | ORAL_TABLET | Freq: Once | ORAL | Status: AC
  Administered 2018-01-26: 800 mg via ORAL
  Filled 2018-01-26: qty 1

## 2018-01-26 MED ORDER — GUAIFENESIN-DM 100-10 MG/5ML PO SYRP
5.0000 mL | ORAL_SOLUTION | ORAL | Status: DC | PRN
  Administered 2018-01-26: 5 mL via ORAL
  Filled 2018-01-26: qty 10

## 2018-01-26 MED ORDER — ALBUTEROL SULFATE (2.5 MG/3ML) 0.083% IN NEBU
INHALATION_SOLUTION | RESPIRATORY_TRACT | Status: AC
  Administered 2018-01-26: 5 mg
  Filled 2018-01-26: qty 6

## 2018-01-26 MED ORDER — IPRATROPIUM-ALBUTEROL 0.5-2.5 (3) MG/3ML IN SOLN
RESPIRATORY_TRACT | Status: AC
  Administered 2018-01-26: 3 mL
  Filled 2018-01-26: qty 3

## 2018-01-26 MED ORDER — LORATADINE 10 MG PO TABS
10.0000 mg | ORAL_TABLET | Freq: Every day | ORAL | Status: DC
  Administered 2018-01-26 – 2018-01-27 (×2): 10 mg via ORAL
  Filled 2018-01-26 (×2): qty 1

## 2018-01-26 MED ORDER — ATORVASTATIN CALCIUM 40 MG PO TABS: 40.0000 mg | ORAL_TABLET | Freq: Every day | ORAL | Status: DC

## 2018-01-26 MED ORDER — FAMOTIDINE 20 MG PO TABS
20.0000 mg | ORAL_TABLET | Freq: Two times a day (BID) | ORAL | Status: DC
  Administered 2018-01-26 – 2018-01-27 (×2): 20 mg via ORAL
  Filled 2018-01-26 (×2): qty 1

## 2018-01-26 MED ORDER — ONDANSETRON HCL 4 MG PO TABS: 4.0000 mg | ORAL_TABLET | Freq: Four times a day (QID) | ORAL | Status: DC | PRN

## 2018-01-26 MED ORDER — ENOXAPARIN SODIUM 40 MG/0.4ML ~~LOC~~ SOLN
40.0000 mg | Freq: Every day | SUBCUTANEOUS | Status: DC
  Administered 2018-01-26: 40 mg via SUBCUTANEOUS
  Filled 2018-01-26: qty 0.4

## 2018-01-26 MED ORDER — ALBUTEROL SULFATE HFA 108 (90 BASE) MCG/ACT IN AERS: 2.0000 | INHALATION_SPRAY | Freq: Four times a day (QID) | RESPIRATORY_TRACT | Status: DC | PRN

## 2018-01-26 MED ORDER — METHYLPREDNISOLONE SODIUM SUCC 125 MG IJ SOLR
125.0000 mg | Freq: Once | INTRAMUSCULAR | Status: AC
  Administered 2018-01-26: 125 mg via INTRAVENOUS
  Filled 2018-01-26: qty 2

## 2018-01-26 MED ORDER — SODIUM CHLORIDE 0.9 % IV SOLN
INTRAVENOUS | Status: DC
  Administered 2018-01-26: 23:00:00 via INTRAVENOUS

## 2018-01-26 MED ORDER — OMEGA-3-ACID ETHYL ESTERS 1 G PO CAPS
2.0000 g | ORAL_CAPSULE | Freq: Every morning | ORAL | Status: DC
  Administered 2018-01-27: 2 g via ORAL
  Filled 2018-01-26: qty 2

## 2018-01-26 MED ORDER — CLOBETASOL PROPIONATE 0.05 % EX SOLN: 1.0000 "application " | Freq: Two times a day (BID) | CUTANEOUS | Status: DC

## 2018-01-26 MED ORDER — FLUTICASONE PROPIONATE 50 MCG/ACT NA SUSP
1.0000 | Freq: Every day | NASAL | Status: DC
  Administered 2018-01-26 – 2018-01-27 (×2): 1 via NASAL
  Filled 2018-01-26: qty 16

## 2018-01-26 MED ORDER — LEVOFLOXACIN IN D5W 750 MG/150ML IV SOLN
750.0000 mg | Freq: Every day | INTRAVENOUS | Status: DC
  Administered 2018-01-26: 750 mg via INTRAVENOUS
  Filled 2018-01-26: qty 150

## 2018-01-26 MED ORDER — CITALOPRAM HYDROBROMIDE 20 MG PO TABS
20.0000 mg | ORAL_TABLET | Freq: Every day | ORAL | Status: DC
  Administered 2018-01-26 – 2018-01-27 (×2): 20 mg via ORAL
  Filled 2018-01-26 (×2): qty 1

## 2018-01-26 MED ORDER — IOPAMIDOL (ISOVUE-370) INJECTION 76%
100.0000 mL | Freq: Once | INTRAVENOUS | Status: AC | PRN
  Administered 2018-01-26: 69 mL via INTRAVENOUS

## 2018-01-26 MED ORDER — METHYLPREDNISOLONE SODIUM SUCC 125 MG IJ SOLR
125.0000 mg | Freq: Four times a day (QID) | INTRAMUSCULAR | Status: DC
  Administered 2018-01-26 – 2018-01-27 (×2): 125 mg via INTRAVENOUS
  Filled 2018-01-26 (×2): qty 2

## 2018-01-26 MED ORDER — ALPRAZOLAM 0.25 MG PO TABS: 0.2500 mg | ORAL_TABLET | Freq: Every evening | ORAL | Status: DC | PRN

## 2018-01-26 MED ORDER — ORAL CARE MOUTH RINSE
15.0000 mL | Freq: Two times a day (BID) | OROMUCOSAL | Status: DC
  Administered 2018-01-26: 15 mL via OROMUCOSAL

## 2018-01-26 MED ORDER — ONDANSETRON HCL 4 MG/2ML IJ SOLN: 4.0000 mg | Freq: Four times a day (QID) | INTRAMUSCULAR | Status: DC | PRN

## 2018-01-26 MED ORDER — ALBUTEROL SULFATE (2.5 MG/3ML) 0.083% IN NEBU: 2.5000 mg | INHALATION_SOLUTION | Freq: Four times a day (QID) | RESPIRATORY_TRACT | Status: DC | PRN

## 2018-01-26 MED ORDER — ALBUTEROL SULFATE (2.5 MG/3ML) 0.083% IN NEBU
INHALATION_SOLUTION | RESPIRATORY_TRACT | Status: AC
  Administered 2018-01-26: 2.5 mg
  Filled 2018-01-26: qty 3

## 2018-01-26 MED ORDER — LOSARTAN POTASSIUM 50 MG PO TABS
100.0000 mg | ORAL_TABLET | Freq: Every day | ORAL | Status: DC
  Administered 2018-01-26 – 2018-01-27 (×2): 100 mg via ORAL
  Filled 2018-01-26 (×2): qty 2

## 2018-01-26 MED ORDER — IPRATROPIUM-ALBUTEROL 0.5-2.5 (3) MG/3ML IN SOLN
3.0000 mL | Freq: Three times a day (TID) | RESPIRATORY_TRACT | Status: DC
  Administered 2018-01-27: 3 mL via RESPIRATORY_TRACT
  Filled 2018-01-26: qty 3

## 2018-01-26 MED ORDER — BUDESONIDE 0.25 MG/2ML IN SUSP
0.2500 mg | Freq: Two times a day (BID) | RESPIRATORY_TRACT | Status: DC
  Administered 2018-01-26 – 2018-01-27 (×2): 0.25 mg via RESPIRATORY_TRACT
  Filled 2018-01-26 (×2): qty 2

## 2018-01-26 MED ORDER — IPRATROPIUM-ALBUTEROL 0.5-2.5 (3) MG/3ML IN SOLN
3.0000 mL | Freq: Four times a day (QID) | RESPIRATORY_TRACT | Status: DC
  Administered 2018-01-26: 3 mL via RESPIRATORY_TRACT
  Filled 2018-01-26: qty 3

## 2018-01-26 NOTE — ED Notes (Signed)
ED Provider at bedside. 

## 2018-01-26 NOTE — H&P (Addendum)
History and Physical   Latoya [name] DSK:[id number] DOB: 1956-03-15 DOA: 01/26/2018  Referring MD/NP/PA: Dr Sherry Ruffing  PCP: Mosie Lukes, MD   Outpatient Specialists: None   Patient coming from: [facility]  Chief Complaint: SOB  HPI: Latoya [name] is a 62 y.o. female with medical history significant of COPD, HTN, Hyperlipidemia and morbid obesity who was seen at [facility] with progressive SOB, cough and wheezing. Symptoms have been going on for day  But worse within one hour of arrival to the ER. Patient recently went to long trip to [facility]. No hx of PE, has bilatyeral lower leg swelling after the trip. Has no fever, no chills, no NVD. Patient denies history of Asthma but was a previous smoker.  ED Course: Temp 98.2, BP was 209/113 pulse 100 respiratory rate of 32 and sats 77% on room air. WBC is 11, rest of CBC and Chemistry is within normal limits. CXR within normal limits.  Review of Systems: As per HPI otherwise 10 point review of systems negative.    Past Medical History:  Diagnosis Date  . Anxiety   . Back pain   . Cancer ([place]) 12/2009   BCC, SCC  . Cervical cancer screening 12/30/2012  . Dermatitis 12/03/2007   Qualifier: Diagnosis of  By: Jerold Coombe    . Eczema 01/04/2013  . Environmental allergies   . Fatty liver   . Hyperlipidemia   . Hypertension   . Morbid obesity ([place]) 01/13/2012  . Multiple food allergies    cashew chicken, chinese, msg?  . Obesity 01/13/2012  . Overweight 01/13/2012  . Pain of toe of left foot 08/23/2014  . Preventative health care 01/04/2013  . Vitamin D deficiency     Past Surgical History:  Procedure Laterality Date  . BREAST BIOPSY Right   . dilation and currettage     lifestyle lift  . MOHS SURGERY  12/2009   basal cell      reports that she has quit smoking. She has never used smokeless tobacco. She reports that she drinks alcohol. She reports that she does not use drugs.  Allergies  Allergen Reactions  . Monosodium Glutamate Hives   Cashew Chicken    Family History  Problem Relation Age of Onset  . Heart disease Mother   . Diabetes Mother   . Hypertension Mother   . Hyperlipidemia Mother   . Heart Problems Mother   . Heart disease Father        pacer  . Diabetes Father   . Hypertension Father        weight controlled  . Obesity Father   . Hyperlipidemia Father   . Heart Problems Father   . Cancer Father   . Obesity Sister        s/p lap band  . Cancer Maternal Grandmother        pancreatic  . Cancer Paternal Grandmother 47       liver  . Psoriasis Brother   . Obesity Brother   . Obesity Brother   . Diabetes Brother        diet controlled  . Melanoma Unknown   . Colon cancer Neg Hx      Prior to Admission medications   Medication Sig Start Date End Date Taking? Authorizing Provider  albuterol (VENTOLIN HFA) 108 (90 Base) MCG/ACT inhaler Inhale 2 puffs into the lungs every 6 (six) hours as needed for wheezing or shortness of breath. 05/15/16  Yes Mosie Lukes, MD  ALPRAZolam Duanne Moron)  0.25 MG tablet Take 1-2 tablets (0.25-0.5 mg total) by mouth at bedtime as needed for sleep or anxiety. 11/13/16  Yes Mosie Lukes, MD  atorvastatin (LIPITOR) 40 MG tablet Take 1 tablet (40 mg total) by mouth daily at 6 PM. 12/23/17  Yes Mosie Lukes, MD  cetirizine (ZYRTEC) 10 MG tablet Take 1 tablet (10 mg total) by mouth 2 (two) times daily. 11/13/16  Yes Mosie Lukes, MD  citalopram (CELEXA) 20 MG tablet Take 1 tablet (20 mg total) by mouth daily. 12/23/17  Yes Mosie Lukes, MD  clobetasol (TEMOVATE) 0.05 % external solution Apply 1 application topically 2 (two) times daily. 11/13/16  Yes Mosie Lukes, MD  Krill Oil 500 MG CAPS Take 2 capsules by mouth every morning.   Yes [provider]  losartan (COZAAR) 100 MG tablet Take 1 tablet (100 mg total) by mouth daily. 12/23/17  Yes Mosie Lukes, MD  beclomethasone (QVAR) 40 MCG/ACT inhaler Inhale 2 puffs into the lungs 2 (two) times daily at 10 AM  and 5 PM. Patient not taking: Reported on 12/11/2017 01/27/16   Saguier, Percell Miller, PA-C  fluticasone [facility]) 50 MCG/ACT nasal spray Administer 2 sprays in each nostril daily for 30 days. 09/05/16 10/05/16  Dennard Nip D, MD  ranitidine (ZANTAC) 150 MG tablet Take 1 tablet (150 mg total) by mouth 2 (two) times daily. Patient not taking: Reported on 12/11/2017 11/13/16   Mosie Lukes, MD    Physical Exam: Vitals:   01/26/18 2000 01/26/18 2025 01/26/18 2107 01/26/18 2216  BP: (!) 152/79 (!) 151/85 (!) 158/105   Pulse: 96 95 86   Resp: (!) 21 (!) 22 20   Temp:   98.2 F (36.8 C)   TempSrc:   Oral   SpO2: 100% 98% 97% 94%  Weight:   88.8 kg   Height:   5\' 8"  (1.727 m)       Constitutional: NAD, calm, comfortable Vitals:   01/26/18 2000 01/26/18 2025 01/26/18 2107 01/26/18 2216  BP: (!) 152/79 (!) 151/85 (!) 158/105   Pulse: 96 95 86   Resp: (!) 21 (!) 22 20   Temp:   98.2 F (36.8 C)   TempSrc:   Oral   SpO2: 100% 98% 97% 94%  Weight:   88.8 kg   Height:   5\' 8"  (1.727 m)    Eyes: PERRL, lids and conjunctivae normal ENMT: Mucous membranes are moist. Posterior pharynx clear of any exudate or lesions.Normal dentition.  Neck: normal, supple, no masses, no thyromegaly Respiratory: clear to auscultation bilaterally, no wheezing, no crackles. Normal respiratory effort. No accessory muscle use.  Cardiovascular: Regular rate and rhythm, no murmurs / rubs / gallops. No extremity edema. 2+ pedal pulses. No carotid bruits.  Abdomen: no tenderness, no masses palpated. No hepatosplenomegaly. Bowel sounds positive.  Musculoskeletal: no clubbing / cyanosis. No joint deformity upper and lower extremities. Good ROM, no contractures. Normal muscle tone.  Skin: no rashes, lesions, ulcers. No induration Neurologic: CN 2-12 grossly intact. Sensation intact, DTR normal. Strength 5/5 in all 4.  Psychiatric: Normal judgment and insight. Alert and oriented x 3. Normal mood.     Labs on Admission: I  have personally reviewed following labs and imaging studies  CBC: Recent Labs  Lab 01/26/18 1532  WBC 11.0*  NEUTROABS 7.7  HGB 13.1  HCT 38.8  MCV 100.3*  PLT 782   Basic Metabolic Panel: Recent Labs  Lab 01/26/18 1653  NA 139  K  3.9  CL 101  CO2 23  GLUCOSE 105*  BUN 12  CREATININE 0.70  CALCIUM 8.7*   GFR: Estimated Creatinine Clearance: 85.1 mL/min (by C-G formula based on SCr of 0.7 mg/dL). Liver Function Tests: Recent Labs  Lab 01/26/18 1653  AST 53*  ALT 55*  ALKPHOS 63  BILITOT 0.6  PROT 7.6  ALBUMIN 4.3   No results for input(s): LIPASE, AMYLASE in the last 168 hours. No results for input(s): AMMONIA in the last 168 hours. Coagulation Profile: No results for input(s): INR, PROTIME in the last 168 hours. Cardiac Enzymes: Recent Labs  Lab 01/26/18 1532 01/26/18 1903  TROPONINI <0.03 <0.03   BNP (last 3 results) No results for input(s): PROBNP in the last 8760 hours. HbA1C: No results for input(s): HGBA1C in the last 72 hours. CBG: No results for input(s): GLUCAP in the last 168 hours. Lipid Profile: No results for input(s): CHOL, HDL, LDLCALC, TRIG, CHOLHDL, LDLDIRECT in the last 72 hours. Thyroid Function Tests: No results for input(s): TSH, T4TOTAL, FREET4, T3FREE, THYROIDAB in the last 72 hours. Anemia Panel: No results for input(s): VITAMINB12, FOLATE, FERRITIN, TIBC, IRON, RETICCTPCT in the last 72 hours. Urine analysis:    Component Value Date/Time   COLORURINE yellow 09/26/2009 0000   APPEARANCEUR Clear 09/26/2009 0000   LABSPEC <1.005 09/26/2009 0000   PHURINE 7.0 09/26/2009 0000   HGBUR negative 09/26/2009 0000   BILIRUBINUR Negative 12/11/2017 1311   PROTEINUR Negative 12/11/2017 1311   UROBILINOGEN negative (A) 12/11/2017 1311   UROBILINOGEN negative 09/26/2009 0000   NITRITE Negative 12/11/2017 1311   NITRITE negative 09/26/2009 0000   LEUKOCYTESUR Negative 12/11/2017 1311   Sepsis  Labs: @LABRCNTIP (procalcitonin:4,lacticidven:4) )No results found for this or any previous visit (from the past 240 hour(s)).   Radiological Exams on Admission: Dg Chest 2 View  Result Date: 01/26/2018 CLINICAL DATA:  Shortness of breath EXAM: CHEST - 2 VIEW COMPARISON:  06/14/2015 FINDINGS: Artifact from EKG leads. Normal heart size and mediastinal contours. No acute infiltrate or edema. No effusion or pneumothorax. No acute osseous findings. IMPRESSION: Negative chest. Electronically Signed   By: Monte Fantasia M.D.   On: 01/26/2018 15:56   Ct Angio Chest Pe W And/or Wo Contrast  Result Date: 01/26/2018 CLINICAL DATA:  Patient with decreased oxygen saturation. Cough. Recent travel. EXAM: CT ANGIOGRAPHY CHEST WITH CONTRAST TECHNIQUE: Multidetector CT imaging of the chest was performed using the standard protocol during bolus administration of intravenous contrast. Multiplanar CT image reconstructions and MIPs were obtained to evaluate the vascular anatomy. CONTRAST:  86mL ISOVUE-370 IOPAMIDOL (ISOVUE-370) INJECTION 76% COMPARISON:  Chest radiograph 01/26/2018 FINDINGS: Cardiovascular: Normal heart size. No pericardial effusion. Aorta and main pulmonary artery are normal in caliber. Thoracic aortic vascular calcifications. Adequate opacification of the pulmonary artery. No filling defect identified to suggest acute pulmonary embolus. Mediastinum/Nodes: No enlarged axillary, mediastinal or hilar lymphadenopathy. Lungs/Pleura: Central airways are patent. Dependent atelectasis right lower lobe. No large area of pulmonary consolidation. No pleural effusion or pneumothorax. Upper Abdomen: Liver is diffusely low in attenuation compatible with steatosis. Increased density material within the gallbladder. Probable small cyst within the spleen. Musculoskeletal: Thoracic spine degenerative changes. No aggressive or acute appearing osseous lesions. Review of the MIP images confirms the above findings. IMPRESSION: 1.  No evidence for acute pulmonary embolus. 2. Hepatic steatosis. 3. Aortic Atherosclerosis (ICD10-I70.0). 4. Increased density within the gallbladder lumen may represent stones/sludge. Electronically Signed   By: Lovey Newcomer M.D.   On: 01/26/2018 18:37   US  Venous Img Lower Bilateral  Result Date: 01/26/2018 CLINICAL DATA:  BILATERAL LOWER extremity edema after a long plane flight earlier today. Personal history of saphenous vein ablation of the LEFT LOWER extremity approximately 4 years ago. EXAM: BILATERAL LOWER EXTREMITY VENOUS DOPPLER ULTRASOUND TECHNIQUE: Gray-scale sonography with graded compression, as well as color Doppler and duplex ultrasound were performed to evaluate the lower extremity deep venous systems from the level of the common femoral vein and including the common femoral, femoral, profunda femoral, popliteal and calf veins including the posterior tibial, peroneal and gastrocnemius veins when visible. The superficial great saphenous vein was also interrogated. Spectral Doppler was utilized to evaluate flow at rest and with distal augmentation maneuvers in the common femoral, femoral and popliteal veins. COMPARISON:  None. FINDINGS: RIGHT LOWER EXTREMITY Common Femoral Vein: No evidence of thrombus. Normal compressibility, respiratory phasicity and response to augmentation. Saphenofemoral Junction: No evidence of thrombus. Normal compressibility and flow on color Doppler imaging. Profunda Femoral Vein: No evidence of thrombus. Normal compressibility and flow on color Doppler imaging. Femoral Vein: No evidence of thrombus. Normal compressibility, respiratory phasicity and response to augmentation. Popliteal Vein: No evidence of thrombus. Normal compressibility, respiratory phasicity and response to augmentation. Calf Veins: No evidence of thrombus. Normal compressibility and flow on color Doppler imaging. Superficial Great Saphenous Vein: No evidence of thrombus. Normal compressibility. Venous  Reflux:  Not evaluated. Other Findings:  None. LEFT LOWER EXTREMITY Common Femoral Vein: No evidence of thrombus. Normal compressibility, respiratory phasicity and response to augmentation. Saphenofemoral Junction: No evidence of thrombus. Normal compressibility and flow on color Doppler imaging. Profunda Femoral Vein: No evidence of thrombus. Normal compressibility and flow on color Doppler imaging. Femoral Vein: No evidence of thrombus. Normal compressibility, respiratory phasicity and response to augmentation. Popliteal Vein: No evidence of thrombus. Normal compressibility, respiratory phasicity and response to augmentation. Calf Veins: No evidence of thrombus. Normal compressibility and flow on color Doppler imaging. Superficial Great Saphenous Vein: No evidence of thrombus. Normal compressibility. Venous Reflux:  Not evaluated. Other Findings:  None. IMPRESSION: No evidence of deep venous thrombosis involving either the RIGHT or LEFT LOWER extremity. Electronically Signed   By: Evangeline Dakin M.D.   On: 01/26/2018 17:23    EKG: Independently reviewed. It shows sinus rhythm with non specific ST changes  Assessment/Plan Principal Problem:   COPD exacerbation (HCC) Active Problems:   Hyperlipidemia, mixed   Essential hypertension   Insomnia     #1 COPD Exacerbation: Admit for inpatient treatment, start IV steroids, Nebulizer and Oxygen. Will give fluids resuscitation. Once better will transition to oral steroids taper with Nebulizer. Add Abx.  #2 HTN: Controlled. Continue home treatment  #3 Insomnia: Continue home medications  #4 Hyperlipidemia: Statin   DVT prophylaxis: Lovenox  Code Status: Full Code  Family Communication: Daughter and Son in Sports coach at bedside Disposition Plan: [place] called: None  Admission status: inpatient   Severity of Illness: The appropriate patient status for this patient is INPATIENT. Inpatient status is judged to be reasonable and necessary in  order to provide the required intensity of service to ensure the patient's safety. The patient's presenting symptoms, physical exam findings, and initial radiographic and laboratory data in the context of their chronic comorbidities is felt to place them at high risk for further clinical deterioration. Furthermore, it is not anticipated that the patient will be medically stable for discharge from the hospital within 2 midnights of admission. The following factors support the patient status of inpatient.   "  The patient's presenting symptoms include Shortness of breath and cough. " The worrisome physical exam findings include Bilateral expiratory wheezing. " The initial radiographic and laboratory data are worrisome because of leucocytosis. " The chronic co-morbidities include Hx of COPD    * I certify that at the point of admission it is my clinical judgment that the patient will require inpatient hospital care spanning beyond 2 midnights from the point of admission due to high intensity of service, high risk for further deterioration and high frequency of surveillance required.Barbette Merino MD [facility] Pager [phone]  If 7PM-7AM, please contact night-coverage [email or web address] Password [facility]  01/26/2018, 11:38 PM

## 2018-01-26 NOTE — ED Notes (Signed)
Pt with increased SOB after CT scan. O2 sats 92% on 4L [place]. EDP [place] notified and RT at bedside

## 2018-01-26 NOTE — ED Notes (Signed)
Pt on cardiac monitor and auto VS 

## 2018-01-26 NOTE — ED Triage Notes (Signed)
Cough and SOB

## 2018-01-26 NOTE — ED Provider Notes (Signed)
[facility] EMERGENCY DEPARTMENT Provider Note   CSN: [id number] Arrival date & time: 01/26/18  1519     History   Chief Complaint Chief Complaint  Patient presents with  . Cough  . Shortness of Breath    HPI Lu Paradise is a 62 y.o. female.  The history is provided by the patient and medical records. No language interpreter was used.  Shortness of Breath  This is a new problem. The average episode lasts 1 hour. The problem occurs continuously.The current episode started less than 1 hour ago. The problem has been gradually improving. Associated symptoms include cough, sputum production, wheezing, chest pain and leg swelling. Pertinent negatives include no fever, no headaches, no neck pain, no hemoptysis, no vomiting, no abdominal pain and no leg pain. Risk factors include recent prolonged sitting. She has tried nothing for the symptoms. Associated medical issues do not include asthma, COPD, pneumonia, chronic lung disease, PE, CAD, heart failure, past MI, DVT or recent surgery.    Past Medical History:  Diagnosis Date  . Anxiety   . Back pain   . Cancer ([place]) 12/2009   BCC, SCC  . Cervical cancer screening 12/30/2012  . Dermatitis 12/03/2007   Qualifier: Diagnosis of  By: Jerold Coombe    . Eczema 01/04/2013  . Environmental allergies   . Fatty liver   . Hyperlipidemia   . Hypertension   . Morbid obesity ([place]) 01/13/2012  . Multiple food allergies    cashew chicken, chinese, msg?  . Obesity 01/13/2012  . Overweight 01/13/2012  . Pain of toe of left foot 08/23/2014  . Preventative health care 01/04/2013  . Vitamin D deficiency     Patient Active Problem List   Diagnosis Date Noted  . Vitamin D deficiency 07/17/2016  . 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 Class 1 obesity without serious comorbidity with body mass index (BMI) of 30.0 to 30.9 in adult 07/17/2016  . Sinusitis 06/26/2015  . Cough 06/26/2015  . Pain of toe of left foot 08/23/2014  . Hot flashes  02/14/2014  . Vitamin B12 deficiency 02/14/2014  . Anxiety 04/01/2013  . Eczema 01/04/2013  . Preventative health care 01/04/2013  . Cervical cancer screening 12/30/2012  . Varicose veins of lower extremities with other complications 59/93/5701  . Insomnia 01/13/2012  . Obesity 01/13/2012  . SKIN CANCER, HX OF 09/26/2009  . Hyperlipidemia, mixed 04/23/2007  . Essential hypertension 04/23/2007    Past Surgical History:  Procedure Laterality Date  . BREAST BIOPSY Right   . dilation and currettage     lifestyle lift  . MOHS SURGERY  12/2009   basal cell      OB History    Gravida  2   Para  2   Term  2   Preterm      AB      Living  2     SAB      TAB      Ectopic      Multiple      Live Births               Home Medications    Prior to Admission medications   Medication Sig Start Date End Date Taking? Authorizing Provider  albuterol (VENTOLIN HFA) 108 (90 Base) MCG/ACT inhaler Inhale 2 puffs into the lungs every 6 (six) hours as needed for wheezing or shortness of breath. 05/15/16  Yes Mosie Lukes, MD  ALPRAZolam Duanne Moron) 0.25 MG tablet Take 1-2 tablets (  0.25-0.5 mg total) by mouth at bedtime as needed for sleep or anxiety. 11/13/16  Yes Mosie Lukes, MD  atorvastatin (LIPITOR) 40 MG tablet Take 1 tablet (40 mg total) by mouth daily at 6 PM. 12/23/17  Yes Mosie Lukes, MD  cetirizine (ZYRTEC) 10 MG tablet Take 1 tablet (10 mg total) by mouth 2 (two) times daily. 11/13/16  Yes Mosie Lukes, MD  citalopram (CELEXA) 20 MG tablet Take 1 tablet (20 mg total) by mouth daily. 12/23/17  Yes Mosie Lukes, MD  clobetasol (TEMOVATE) 0.05 % external solution Apply 1 application topically 2 (two) times daily. 11/13/16  Yes Mosie Lukes, MD  Krill Oil 500 MG CAPS Take 2 capsules by mouth every morning.   Yes [provider]  losartan (COZAAR) 100 MG tablet Take 1 tablet (100 mg total) by mouth daily. 12/23/17  Yes Mosie Lukes, MD    beclomethasone (QVAR) 40 MCG/ACT inhaler Inhale 2 puffs into the lungs 2 (two) times daily at 10 AM and 5 PM. Patient not taking: Reported on 12/11/2017 01/27/16   Saguier, Percell Miller, PA-C  fluticasone [facility]) 50 MCG/ACT nasal spray Administer 2 sprays in each nostril daily for 30 days. 09/05/16 10/05/16  Dennard Nip D, MD  ranitidine (ZANTAC) 150 MG tablet Take 1 tablet (150 mg total) by mouth 2 (two) times daily. Patient not taking: Reported on 12/11/2017 11/13/16   Mosie Lukes, MD    Family History Family History  Problem Relation Age of Onset  . Heart disease Mother   . Diabetes Mother   . Hypertension Mother   . Hyperlipidemia Mother   . Heart Problems Mother   . Heart disease Father        pacer  . Diabetes Father   . Hypertension Father        weight controlled  . Obesity Father   . Hyperlipidemia Father   . Heart Problems Father   . Cancer Father   . Obesity Sister        s/p lap band  . Cancer Maternal Grandmother        pancreatic  . Cancer Paternal Grandmother 71       liver  . Psoriasis Brother   . Obesity Brother   . Obesity Brother   . Diabetes Brother        diet controlled  . Melanoma Unknown   . Colon cancer Neg Hx     Social History Social History   Tobacco Use  . Smoking status: Former Research scientist (life sciences)  . Smokeless tobacco: Never Used  . Tobacco comment: quit 2008  Substance Use Topics  . Alcohol use: Yes    Comment: weekly  . Drug use: No     Allergies   Monosodium glutamate   Review of Systems Review of Systems  Constitutional: Positive for chills. Negative for diaphoresis, fatigue and fever.  HENT: Positive for congestion.   Eyes: Negative for visual disturbance.  Respiratory: Positive for cough, sputum production, chest tightness, shortness of breath and wheezing. Negative for hemoptysis.   Cardiovascular: Positive for chest pain and leg swelling. Negative for palpitations.  Gastrointestinal: Negative for abdominal pain, diarrhea, nausea  and vomiting.  Genitourinary: Negative for flank pain.  Musculoskeletal: Negative for back pain, neck pain and neck stiffness.  Neurological: Negative for light-headedness and headaches.  Psychiatric/Behavioral: Negative for agitation.  All other systems reviewed and are negative.    Physical Exam Updated Vital Signs BP (!) 209/113 (BP Location: Right Arm)  Pulse 97   Temp 98.3 F (36.8 C) (Oral)   Resp (!) 32   Ht 5\' 8"  (1.727 m)   Wt 88.5 kg   LMP  (LMP Unknown) Comment: 2012 ?  SpO2 92%   BMI 29.65 kg/m   Physical Exam  Constitutional: She appears well-developed and well-nourished.  Non-toxic appearance. She appears ill (on arrivan, began to improve). No distress.  HENT:  Head: Normocephalic and atraumatic.  Mouth/Throat: Oropharynx is clear and moist. No posterior oropharyngeal edema.  Eyes: Pupils are equal, round, and reactive to light. Conjunctivae and EOM are normal.  Neck: Normal range of motion. Neck supple.  Cardiovascular: Regular rhythm. Tachycardia present.  No murmur heard. Pulmonary/Chest: Accessory muscle usage present. Tachypnea noted. No respiratory distress. She has wheezes. She has no rhonchi. She has no rales.  Abdominal: Soft. There is no tenderness.  Musculoskeletal:       Right lower leg: She exhibits edema (mild).       Left lower leg: She exhibits edema (mild).  Neurological: She is alert. She is not disoriented.  Skin: Skin is warm and dry.  Psychiatric: She has a normal mood and affect.  Nursing note and vitals reviewed.    ED Treatments / Results  Labs (all labs ordered are listed, but only abnormal results are displayed) Labs Reviewed  BRAIN NATRIURETIC PEPTIDE - Abnormal; Notable for the following components:      Result Value   B Natriuretic Peptide 128.0 (*)    All other components within normal limits  CBC WITH DIFFERENTIAL/PLATELET - Abnormal; Notable for the following components:   WBC 11.0 (*)    MCV 100.3 (*)    All other  components within normal limits  COMPREHENSIVE METABOLIC PANEL - Abnormal; Notable for the following components:   Glucose, Bld 105 (*)    Calcium 8.7 (*)    AST 53 (*)    ALT 55 (*)    All other components within normal limits  TROPONIN I  TROPONIN I    EKG EKG Interpretation  Date/Time:  Sunday January 26 2018 15:31:33 EDT Ventricular Rate:  93 PR Interval:    QRS Duration: 89 QT Interval:  346 QTC Calculation: 431 R Axis:   68 Text Interpretation:  Sinus rhythm Minimal ST depression, inferior leads Baseline wander in lead(s) V5 V6 No prior ECG for comparison.  No STEMI Confirmed by Antony Blackbird [phone]) on 01/26/2018 3:44:44 PM   Radiology Dg Chest 2 View  Result Date: 01/26/2018 CLINICAL DATA:  Shortness of breath EXAM: CHEST - 2 VIEW COMPARISON:  06/14/2015 FINDINGS: Artifact from EKG leads. Normal heart size and mediastinal contours. No acute infiltrate or edema. No effusion or pneumothorax. No acute osseous findings. IMPRESSION: Negative chest. Electronically Signed   By: Monte Fantasia M.D.   On: 01/26/2018 15:56   Ct Angio Chest Pe W And/or Wo Contrast  Result Date: 01/26/2018 CLINICAL DATA:  Patient with decreased oxygen saturation. Cough. Recent travel. EXAM: CT ANGIOGRAPHY CHEST WITH CONTRAST TECHNIQUE: Multidetector CT imaging of the chest was performed using the standard protocol during bolus administration of intravenous contrast. Multiplanar CT image reconstructions and MIPs were obtained to evaluate the vascular anatomy. CONTRAST:  25mL ISOVUE-370 IOPAMIDOL (ISOVUE-370) INJECTION 76% COMPARISON:  Chest radiograph 01/26/2018 FINDINGS: Cardiovascular: Normal heart size. No pericardial effusion. Aorta and main pulmonary artery are normal in caliber. Thoracic aortic vascular calcifications. Adequate opacification of the pulmonary artery. No filling defect identified to suggest acute pulmonary embolus. Mediastinum/Nodes: No enlarged axillary, mediastinal or  hilar  lymphadenopathy. Lungs/Pleura: Central airways are patent. Dependent atelectasis right lower lobe. No large area of pulmonary consolidation. No pleural effusion or pneumothorax. Upper Abdomen: Liver is diffusely low in attenuation compatible with steatosis. Increased density material within the gallbladder. Probable small cyst within the spleen. Musculoskeletal: Thoracic spine degenerative changes. No aggressive or acute appearing osseous lesions. Review of the MIP images confirms the above findings. IMPRESSION: 1. No evidence for acute pulmonary embolus. 2. Hepatic steatosis. 3. Aortic Atherosclerosis (ICD10-I70.0). 4. Increased density within the gallbladder lumen may represent stones/sludge. Electronically Signed   By: Lovey Newcomer M.D.   On: 01/26/2018 18:37   US Venous Img Lower Bilateral  Result Date: 01/26/2018 CLINICAL DATA:  BILATERAL LOWER extremity edema after a long plane flight earlier today. Personal history of saphenous vein ablation of the LEFT LOWER extremity approximately 4 years ago. EXAM: BILATERAL LOWER EXTREMITY VENOUS DOPPLER ULTRASOUND TECHNIQUE: Gray-scale sonography with graded compression, as well as color Doppler and duplex ultrasound were performed to evaluate the lower extremity deep venous systems from the level of the common femoral vein and including the common femoral, femoral, profunda femoral, popliteal and calf veins including the posterior tibial, peroneal and gastrocnemius veins when visible. The superficial great saphenous vein was also interrogated. Spectral Doppler was utilized to evaluate flow at rest and with distal augmentation maneuvers in the common femoral, femoral and popliteal veins. COMPARISON:  None. FINDINGS: RIGHT LOWER EXTREMITY Common Femoral Vein: No evidence of thrombus. Normal compressibility, respiratory phasicity and response to augmentation. Saphenofemoral Junction: No evidence of thrombus. Normal compressibility and flow on color Doppler imaging.  Profunda Femoral Vein: No evidence of thrombus. Normal compressibility and flow on color Doppler imaging. Femoral Vein: No evidence of thrombus. Normal compressibility, respiratory phasicity and response to augmentation. Popliteal Vein: No evidence of thrombus. Normal compressibility, respiratory phasicity and response to augmentation. Calf Veins: No evidence of thrombus. Normal compressibility and flow on color Doppler imaging. Superficial Great Saphenous Vein: No evidence of thrombus. Normal compressibility. Venous Reflux:  Not evaluated. Other Findings:  None. LEFT LOWER EXTREMITY Common Femoral Vein: No evidence of thrombus. Normal compressibility, respiratory phasicity and response to augmentation. Saphenofemoral Junction: No evidence of thrombus. Normal compressibility and flow on color Doppler imaging. Profunda Femoral Vein: No evidence of thrombus. Normal compressibility and flow on color Doppler imaging. Femoral Vein: No evidence of thrombus. Normal compressibility, respiratory phasicity and response to augmentation. Popliteal Vein: No evidence of thrombus. Normal compressibility, respiratory phasicity and response to augmentation. Calf Veins: No evidence of thrombus. Normal compressibility and flow on color Doppler imaging. Superficial Great Saphenous Vein: No evidence of thrombus. Normal compressibility. Venous Reflux:  Not evaluated. Other Findings:  None. IMPRESSION: No evidence of deep venous thrombosis involving either the RIGHT or LEFT LOWER extremity. Electronically Signed   By: Evangeline Dakin M.D.   On: 01/26/2018 17:23    Procedures Procedures (including critical care time)  CRITICAL CARE Performed by: Gwenyth Allegra Fenris Cauble Total critical care time: 45 minutes Critical care time was exclusive of separately billable procedures and treating other patients. Critical care was necessary to treat or prevent imminent or life-threatening deterioration. Critical care was time spent personally  by me on the following activities: development of treatment plan with patient and/or surrogate as well as nursing, discussions with consultants, evaluation of patient's response to treatment, examination of patient, obtaining history from patient or surrogate, ordering and performing treatments and interventions, ordering and review of laboratory studies, ordering and review of radiographic studies, pulse oximetry and  re-evaluation of patient's condition.   Medications Ordered in ED Medications  methylPREDNISolone sodium succinate (SOLU-MEDROL) 125 mg/2 mL injection 125 mg (has no administration in time range)  albuterol (PROVENTIL) (2.5 MG/3ML) 0.083% nebulizer solution (2.5 mg  Given 01/26/18 1530)  ipratropium-albuterol (DUONEB) 0.5-2.5 (3) MG/3ML nebulizer solution (3 mLs  Given 01/26/18 1530)  iopamidol (ISOVUE-370) 76 % injection 100 mL (69 mLs Intravenous Contrast Given 01/26/18 1801)  albuterol (PROVENTIL) (2.5 MG/3ML) 0.083% nebulizer solution (5 mg  Given 01/26/18 1826)     Initial Impression / Assessment and Plan / ED Course  I have reviewed the triage vital signs and the nursing notes.  Pertinent labs & imaging results that were available during my care of the patient were reviewed by me and considered in my medical decision making (see chart for details).     Latoya [name] is a 62 y.o. female with a past medical history significant for hypertension, hyperlipidemia and history of skin cancer who presents for 3 days of cough and 1 hour of severe shortness of breath and chest tightness.  Patient reports that she has been traveling recently and had a flight from [place] several days ago as well as a long drive from the beach back to [place] today.  She says that she has had a mildly productive cough over the last few days and has had some mild chills.  No fevers.  She has not had shortness of breath with this until approximate 1 hour ago she had sudden onset of severe shortness of breath.   Oxygen saturations were in the 70s on arrival to the ED on room air.  She has no history of COPD or asthma.  She denies any history of DVT or PE.  She reports that her legs have been swollen bilaterally for the last few days.  She reports that she has chest tightness across her chest around the sides when she is coughing.  She denies nausea, vomiting, or other symptoms.  She does report a family history of heart disease.  On exam, patient had wheezing in all lung fields on arrival.  Patient was also to get neck and had increased work of breathing.  Patient's oxygen saturations were in the 70s on arrival.  Patient started on a breathing treatment and oxygen supplementation which improved her sats.  Blood pressure also improved into the 150 range from 200 and respiratory rate improved into the 20s from the 30s.  Patient felt more comfortable after breathing treatment.  Patient's chest was nontender and back was nontender.  Abdomen was nontender.  Patient had minimal edema in her legs.  Clinically my greatest concern is for DVT or pulmonary embolism.  With her sudden onset of bandlike chest tightness/discomfort in the hypoxia, in the setting of her recent travel patient was not felt appropriate for a d-dimer but instead will get a PE study.  She will also have bilateral leg ultrasounds to look for clot given the swelling.  She will have screening laboratory testing including a BNP.  Chest x-ray will be obtained initially to look for a pneumothorax in the setting of her coughing fits.  Breath sounds are equal bilaterally however.  Anticipate reassessment after work-up.  6:53 PM Work-up thus far has been reassuring.  Troponin was negative initially.  Chest x-ray reassuring.  Due to the high concern for a pulmonary embolism, CT PE study was ordered.  CT scan did not show evidence of blood clot or pneumonia.  There is also no evidence  of significant fluid overload.  BNP slightly elevated at 128 however I do not  think she is in acute heart failure.  AST and ALT were slightly elevated from prior and patient did have gallstones and sludge on imaging however patient had no abdominal tenderness.  Doubt acute cholecystitis.  DVT ultrasounds were negative.  Patient received a second DuoNeb and continues to have wheezing and a 4 L oxygen requirement.  Next  Given patient's new hypoxia and continued wheezing, patient will require admission for further respiratory management.  Patient will be given steroids for likely new reactive airway disease.  Hospitalist team at [facility] long call for admission.   Final Clinical Impressions(s) / ED Diagnoses   Final diagnoses:  Cough  Shortness of breath  Hypoxia    Clinical Impression: 1. Cough   2. Shortness of breath   3. Hypoxia     Disposition: Admit  This note was prepared with assistance of Dragon voice recognition software. Occasional wrong-word or sound-a-like substitutions may have occurred due to the inherent limitations of voice recognition software.      Nijah Orlich, Gwenyth Allegra, MD 01/27/18 [phone]

## 2018-01-26 NOTE — ED Notes (Signed)
Patient transported to CT 

## 2018-01-26 NOTE — ED Notes (Signed)
RT at bedside due to low O2 sats.

## 2018-01-26 NOTE — ED Notes (Signed)
Pt is still in radiology/ultrasound

## 2018-01-26 NOTE — ED Notes (Signed)
Patient transported to Ultrasound 

## 2018-01-26 NOTE — ED Notes (Signed)
Patient transported to X-ray 

## 2018-01-26 NOTE — ED Triage Notes (Signed)
Cough x 3 days. SOB x 1 hour.

## 2018-01-26 NOTE — Progress Notes (Signed)
62 yo female with no hx of copd / asthma apparently was hypoxic and sob and presented to the ED for evaluation,  Wheezing, pox in the 70's CTA chest negative,  Not clinically in CHF.  ED requesting admission for asthma exacerbation.

## 2018-01-27 DIAGNOSIS — J441 Chronic obstructive pulmonary disease with (acute) exacerbation: Secondary | ICD-10-CM | POA: Diagnosis not present

## 2018-01-27 LAB — COMPREHENSIVE METABOLIC PANEL
ALBUMIN: 3.9 g/dL (ref 3.5–5.0)
ALK PHOS: 58 U/L (ref 38–126)
ALT: 48 U/L — AB (ref 0–44)
AST: 40 U/L (ref 15–41)
Anion gap: 9 (ref 5–15)
BUN: 10 mg/dL (ref 8–23)
CO2: 26 mmol/L (ref 22–32)
Calcium: 8.9 mg/dL (ref 8.9–10.3)
Chloride: 104 mmol/L (ref 98–111)
Creatinine, Ser: 0.62 mg/dL (ref 0.44–1.00)
GFR calc Af Amer: >60 mL/min (ref >60)
GFR calc non Af Amer: >60 mL/min (ref >60)
GLUCOSE: 197 mg/dL — AB (ref 70–99)
Potassium: 3.8 mmol/L (ref 3.5–5.1)
SODIUM: 139 mmol/L (ref 135–145)
TOTAL PROTEIN: 7.1 g/dL (ref 6.5–8.1)
Total Bilirubin: 0.5 mg/dL (ref 0.3–1.2)

## 2018-01-27 LAB — CBC
HEMATOCRIT: 35.6 % — AB (ref 36.0–46.0)
Hemoglobin: 11.8 g/dL — ABNORMAL LOW (ref 12.0–15.0)
MCH: 33.5 pg (ref 26.0–34.0)
MCHC: 33.1 g/dL (ref 30.0–36.0)
MCV: 101.1 fL — AB (ref 78.0–100.0)
PLATELETS: 188 10*3/uL (ref 150–400)
RBC: 3.52 MIL/uL — AB (ref 3.87–5.11)
RDW: 14.7 % (ref 11.5–15.5)
WBC: 6.1 10*3/uL (ref 4.0–10.5)

## 2018-01-27 LAB — HIV ANTIBODY (ROUTINE TESTING W REFLEX): HIV Screen 4th Generation wRfx: NONREACTIVE

## 2018-01-27 MED ORDER — PREDNISONE 20 MG PO TABS: 40.0000 mg | ORAL_TABLET | Freq: Every day | ORAL | 0 refills | Status: AC

## 2018-01-27 NOTE — Discharge Instructions (Signed)

## 2018-01-27 NOTE — Discharge Summary (Signed)
Physician Discharge Summary  Latoya [name] DVV:[id number] DOB: 1956-04-17 DOA: 01/26/2018  PCP: Mosie Lukes, MD  Admit date: 01/26/2018 Discharge date: 01/27/2018  Admitted From: Home Disposition:  Home  Recommendations for Outpatient Follow-up:  1. Follow up with PCP in 1-2 weeks 2. Please obtain BMP/CBC in one week   Home Health: No Equipment/Devices:No  Discharge Condition:stable CODE STATUS:FULL Diet recommendation: Heart Healthy   Brief/Interim Summary:  #) COPD exacerbation: Patient was admitted with COPD exacerbation.  She was given steroids and scheduled bronchodilators with improvement in symptoms.  She initially was hypoxic and required 2 L nasal cannula but was quickly weaned to room air.  Chest x-ray did not show evidence of pneumonia.  Patient was discharged on oral steroids.  Patient was continued on home ICS.  Due to elevated d-dimer bilateral lower extremities ultrasound and CTA of the chest were performed and were negative.  #) Hyperlipidemia: Patient was continued on home atorvastatin.  #) Hypertension: Patient was continued on home losartan.  #) Pain/psych: Patient was continued on home citalopram and PRN alprazolam.   Discharge Diagnoses:  Principal Problem:   COPD exacerbation ([place]) Active Problems:   Hyperlipidemia, mixed   Essential hypertension   Insomnia    Discharge Instructions  Discharge Instructions    Call MD for:  difficulty breathing, headache or visual disturbances   Complete by:  As directed    Call MD for:  hives   Complete by:  As directed    Call MD for:  persistant nausea and vomiting   Complete by:  As directed    Call MD for:  redness, tenderness, or signs of infection (pain, swelling, redness, odor or green/yellow discharge around incision site)   Complete by:  As directed    Call MD for:  severe uncontrolled pain   Complete by:  As directed    Call MD for:  temperature >100.4   Complete by:  As directed    Diet - low  sodium heart healthy   Complete by:  As directed    Discharge instructions   Complete by:  As directed    Please follow-up with your primary doctor in 1 week.  Please take all your antibiotics as prescribed.   Increase activity slowly   Complete by:  As directed      Allergies as of 01/27/2018      Reactions   Monosodium Glutamate Hives   Cashew Chicken      Medication List    TAKE these medications   albuterol 108 (90 Base) MCG/ACT inhaler Commonly known as:  PROVENTIL HFA;VENTOLIN HFA Inhale 2 puffs into the lungs every 6 (six) hours as needed for wheezing or shortness of breath.   ALPRAZolam 0.25 MG tablet Commonly known as:  XANAX Take 1-2 tablets (0.25-0.5 mg total) by mouth at bedtime as needed for sleep or anxiety.   atorvastatin 40 MG tablet Commonly known as:  LIPITOR Take 1 tablet (40 mg total) by mouth daily at 6 PM. What changed:  when to take this   beclomethasone 40 MCG/ACT inhaler Commonly known as:  QVAR Inhale 2 puffs into the lungs 2 (two) times daily at 10 AM and 5 PM.   cetirizine 10 MG tablet Commonly known as:  ZYRTEC Take 1 tablet (10 mg total) by mouth 2 (two) times daily. What changed:  when to take this   citalopram 20 MG tablet Commonly known as:  CELEXA Take 1 tablet (20 mg total) by mouth daily.   clobetasol  0.05 % external solution Commonly known as:  TEMOVATE Apply 1 application topically 2 (two) times daily. What changed:    when to take this  reasons to take this   Krill Oil 500 MG Caps Take 1 capsule by mouth daily after breakfast.   losartan 100 MG tablet Commonly known as:  COZAAR Take 1 tablet (100 mg total) by mouth daily.   predniSONE 20 MG tablet Commonly known as:  DELTASONE Take 2 tablets (40 mg total) by mouth daily for 4 days.   ranitidine 150 MG tablet Commonly known as:  ZANTAC Take 1 tablet (150 mg total) by mouth 2 (two) times daily.       Allergies  Allergen Reactions  . Monosodium Glutamate Hives     Cashew Chicken    Consultations:  None   Procedures/Studies: Dg Chest 2 View  Result Date: 01/26/2018 CLINICAL DATA:  Shortness of breath EXAM: CHEST - 2 VIEW COMPARISON:  06/14/2015 FINDINGS: Artifact from EKG leads. Normal heart size and mediastinal contours. No acute infiltrate or edema. No effusion or pneumothorax. No acute osseous findings. IMPRESSION: Negative chest. Electronically Signed   By: Monte Fantasia M.D.   On: 01/26/2018 15:56   Ct Angio Chest Pe W And/or Wo Contrast  Result Date: 01/26/2018 CLINICAL DATA:  Patient with decreased oxygen saturation. Cough. Recent travel. EXAM: CT ANGIOGRAPHY CHEST WITH CONTRAST TECHNIQUE: Multidetector CT imaging of the chest was performed using the standard protocol during bolus administration of intravenous contrast. Multiplanar CT image reconstructions and MIPs were obtained to evaluate the vascular anatomy. CONTRAST:  46mL ISOVUE-370 IOPAMIDOL (ISOVUE-370) INJECTION 76% COMPARISON:  Chest radiograph 01/26/2018 FINDINGS: Cardiovascular: Normal heart size. No pericardial effusion. Aorta and main pulmonary artery are normal in caliber. Thoracic aortic vascular calcifications. Adequate opacification of the pulmonary artery. No filling defect identified to suggest acute pulmonary embolus. Mediastinum/Nodes: No enlarged axillary, mediastinal or hilar lymphadenopathy. Lungs/Pleura: Central airways are patent. Dependent atelectasis right lower lobe. No large area of pulmonary consolidation. No pleural effusion or pneumothorax. Upper Abdomen: Liver is diffusely low in attenuation compatible with steatosis. Increased density material within the gallbladder. Probable small cyst within the spleen. Musculoskeletal: Thoracic spine degenerative changes. No aggressive or acute appearing osseous lesions. Review of the MIP images confirms the above findings. IMPRESSION: 1. No evidence for acute pulmonary embolus. 2. Hepatic steatosis. 3. Aortic Atherosclerosis  (ICD10-I70.0). 4. Increased density within the gallbladder lumen may represent stones/sludge. Electronically Signed   By: Lovey Newcomer M.D.   On: 01/26/2018 18:37   US Venous Img Lower Bilateral  Result Date: 01/26/2018 CLINICAL DATA:  BILATERAL LOWER extremity edema after a long plane flight earlier today. Personal history of saphenous vein ablation of the LEFT LOWER extremity approximately 4 years ago. EXAM: BILATERAL LOWER EXTREMITY VENOUS DOPPLER ULTRASOUND TECHNIQUE: Gray-scale sonography with graded compression, as well as color Doppler and duplex ultrasound were performed to evaluate the lower extremity deep venous systems from the level of the common femoral vein and including the common femoral, femoral, profunda femoral, popliteal and calf veins including the posterior tibial, peroneal and gastrocnemius veins when visible. The superficial great saphenous vein was also interrogated. Spectral Doppler was utilized to evaluate flow at rest and with distal augmentation maneuvers in the common femoral, femoral and popliteal veins. COMPARISON:  None. FINDINGS: RIGHT LOWER EXTREMITY Common Femoral Vein: No evidence of thrombus. Normal compressibility, respiratory phasicity and response to augmentation. Saphenofemoral Junction: No evidence of thrombus. Normal compressibility and flow on color Doppler imaging. Profunda Femoral Vein:  No evidence of thrombus. Normal compressibility and flow on color Doppler imaging. Femoral Vein: No evidence of thrombus. Normal compressibility, respiratory phasicity and response to augmentation. Popliteal Vein: No evidence of thrombus. Normal compressibility, respiratory phasicity and response to augmentation. Calf Veins: No evidence of thrombus. Normal compressibility and flow on color Doppler imaging. Superficial Great Saphenous Vein: No evidence of thrombus. Normal compressibility. Venous Reflux:  Not evaluated. Other Findings:  None. LEFT LOWER EXTREMITY Common Femoral Vein: No  evidence of thrombus. Normal compressibility, respiratory phasicity and response to augmentation. Saphenofemoral Junction: No evidence of thrombus. Normal compressibility and flow on color Doppler imaging. Profunda Femoral Vein: No evidence of thrombus. Normal compressibility and flow on color Doppler imaging. Femoral Vein: No evidence of thrombus. Normal compressibility, respiratory phasicity and response to augmentation. Popliteal Vein: No evidence of thrombus. Normal compressibility, respiratory phasicity and response to augmentation. Calf Veins: No evidence of thrombus. Normal compressibility and flow on color Doppler imaging. Superficial Great Saphenous Vein: No evidence of thrombus. Normal compressibility. Venous Reflux:  Not evaluated. Other Findings:  None. IMPRESSION: No evidence of deep venous thrombosis involving either the RIGHT or LEFT LOWER extremity. Electronically Signed   By: Evangeline Dakin M.D.   On: 01/26/2018 17:23     Subjective:   Discharge Exam: Vitals:   01/27/18 0822 01/27/18 0950  BP:    Pulse:    Resp:    Temp:    SpO2: 96% 95%   Vitals:   01/27/18 0635 01/27/18 0711 01/27/18 0822 01/27/18 0950  BP:      Pulse:      Resp:      Temp:      TempSrc:      SpO2: 98% 96% 96% 95%  Weight:      Height:        General: Pt is alert, awake, not in acute distress Cardiovascular: Regular rate and rhythm, no murmurs Respiratory: CTA bilaterally, no wheezing, no rhonchi Abdominal: Soft, NT, ND, bowel sounds + Extremities: Trace lower extremity edema    The results of significant diagnostics from this hospitalization (including imaging, microbiology, ancillary and laboratory) are listed below for reference.     Microbiology: No results found for this or any previous visit (from the past 240 hour(s)).   Labs: BNP (last 3 results) Recent Labs    01/26/18 1532  BNP 517.6*   Basic Metabolic Panel: Recent Labs  Lab 01/26/18 1653 01/27/18 0426  NA 139 139   K 3.9 3.8  CL 101 104  CO2 23 26  GLUCOSE 105* 197*  BUN 12 10  CREATININE 0.70 0.62  CALCIUM 8.7* 8.9   Liver Function Tests: Recent Labs  Lab 01/26/18 1653 01/27/18 0426  AST 53* 40  ALT 55* 48*  ALKPHOS 63 58  BILITOT 0.6 0.5  PROT 7.6 7.1  ALBUMIN 4.3 3.9   No results for input(s): LIPASE, AMYLASE in the last 168 hours. No results for input(s): AMMONIA in the last 168 hours. CBC: Recent Labs  Lab 01/26/18 1532 01/27/18 0426  WBC 11.0* 6.1  NEUTROABS 7.7  --   HGB 13.1 11.8*  HCT 38.8 35.6*  MCV 100.3* 101.1*  PLT 231 188   Cardiac Enzymes: Recent Labs  Lab 01/26/18 1532 01/26/18 1903  TROPONINI <0.03 <0.03   BNP: Invalid input(s): POCBNP CBG: No results for input(s): GLUCAP in the last 168 hours. D-Dimer No results for input(s): DDIMER in the last 72 hours. Hgb A1c No results for input(s): HGBA1C in the last 72  hours. Lipid Profile No results for input(s): CHOL, HDL, LDLCALC, TRIG, CHOLHDL, LDLDIRECT in the last 72 hours. Thyroid function studies No results for input(s): TSH, T4TOTAL, T3FREE, THYROIDAB in the last 72 hours.  Invalid input(s): FREET3 Anemia work up No results for input(s): VITAMINB12, FOLATE, FERRITIN, TIBC, IRON, RETICCTPCT in the last 72 hours. Urinalysis    Component Value Date/Time   COLORURINE yellow 09/26/2009 0000   APPEARANCEUR Clear 09/26/2009 0000   LABSPEC <1.005 09/26/2009 0000   PHURINE 7.0 09/26/2009 0000   HGBUR negative 09/26/2009 0000   BILIRUBINUR Negative 12/11/2017 1311   PROTEINUR Negative 12/11/2017 1311   UROBILINOGEN negative (A) 12/11/2017 1311   UROBILINOGEN negative 09/26/2009 0000   NITRITE Negative 12/11/2017 1311   NITRITE negative 09/26/2009 0000   LEUKOCYTESUR Negative 12/11/2017 1311   Sepsis Labs Invalid input(s): PROCALCITONIN,  WBC,  LACTICIDVEN Microbiology No results found for this or any previous visit (from the past 240 hour(s)).   Time coordinating discharge:  35  SIGNED:   Cristy Folks, MD  [facility] 01/27/2018, 12:22 PM  If 7PM-7AM, please contact night-coverage [email or web address] Password [email or web address]

## 2018-01-28 ENCOUNTER — Other Ambulatory Visit: Payer: Self-pay | Admitting: Family Medicine

## 2018-01-28 ENCOUNTER — Other Ambulatory Visit: Payer: Self-pay

## 2018-01-28 DIAGNOSIS — R059 Cough, unspecified: Secondary | ICD-10-CM

## 2018-01-28 DIAGNOSIS — R05 Cough: Secondary | ICD-10-CM

## 2018-01-28 DIAGNOSIS — R0602 Shortness of breath: Secondary | ICD-10-CM

## 2018-01-28 MED ORDER — BECLOMETHASONE DIPROPIONATE 40 MCG/ACT IN AERS: 2.0000 | INHALATION_SPRAY | Freq: Two times a day (BID) | RESPIRATORY_TRACT | 2 refills | Status: DC

## 2018-01-30 ENCOUNTER — Other Ambulatory Visit (INDEPENDENT_AMBULATORY_CARE_PROVIDER_SITE_OTHER): Payer: No Typology Code available for payment source

## 2018-01-30 DIAGNOSIS — R0602 Shortness of breath: Secondary | ICD-10-CM

## 2018-01-30 DIAGNOSIS — R05 Cough: Secondary | ICD-10-CM

## 2018-01-30 DIAGNOSIS — R059 Cough, unspecified: Secondary | ICD-10-CM

## 2018-01-30 LAB — CBC
HEMATOCRIT: 37.7 % (ref 36.0–46.0)
Hemoglobin: 12.8 g/dL (ref 12.0–15.0)
MCHC: 33.9 g/dL (ref 30.0–36.0)
MCV: 99.3 fl (ref 78.0–100.0)
Platelets: 224 10*3/uL (ref 150.0–400.0)
RBC: 3.79 Mil/uL — ABNORMAL LOW (ref 3.87–5.11)
RDW: 15.4 % (ref 11.5–15.5)
WBC: 10.9 10*3/uL — AB (ref 4.0–10.5)

## 2018-01-30 LAB — BASIC METABOLIC PANEL
BUN: 18 mg/dL (ref 6–23)
CALCIUM: 9.2 mg/dL (ref 8.4–10.5)
CO2: 27 meq/L (ref 19–32)
Chloride: 101 mEq/L (ref 96–112)
Creatinine, Ser: 0.88 mg/dL (ref 0.40–1.20)
GFR: 69.02 mL/min (ref >60.00)
Glucose, Bld: 96 mg/dL (ref 70–99)
Potassium: 3.4 mEq/L — ABNORMAL LOW (ref 3.5–5.1)
SODIUM: 139 meq/L (ref 135–145)

## 2018-02-11 ENCOUNTER — Ambulatory Visit (INDEPENDENT_AMBULATORY_CARE_PROVIDER_SITE_OTHER): Payer: No Typology Code available for payment source | Admitting: Family Medicine

## 2018-02-11 VITALS — BP 122/72 | HR 85 | Resp 18 | Ht 68.0 in | Wt 193.2 lb

## 2018-02-11 DIAGNOSIS — J45909 Unspecified asthma, uncomplicated: Secondary | ICD-10-CM | POA: Diagnosis not present

## 2018-02-11 DIAGNOSIS — Z87891 Personal history of nicotine dependence: Secondary | ICD-10-CM

## 2018-02-11 DIAGNOSIS — E782 Mixed hyperlipidemia: Secondary | ICD-10-CM | POA: Diagnosis not present

## 2018-02-11 DIAGNOSIS — I10 Essential (primary) hypertension: Secondary | ICD-10-CM | POA: Diagnosis not present

## 2018-02-11 DIAGNOSIS — Z9109 Other allergy status, other than to drugs and biological substances: Secondary | ICD-10-CM

## 2018-02-11 DIAGNOSIS — J45901 Unspecified asthma with (acute) exacerbation: Secondary | ICD-10-CM

## 2018-02-11 MED ORDER — MONTELUKAST SODIUM 10 MG PO TABS: 10.0000 mg | ORAL_TABLET | Freq: Every day | ORAL | 3 refills | Status: DC

## 2018-02-11 MED ORDER — FLUTICASONE PROPIONATE 50 MCG/ACT NA SUSP: 2.0000 | Freq: Every day | NASAL | 6 refills | Status: DC

## 2018-02-11 NOTE — Patient Instructions (Signed)

## 2018-02-11 NOTE — Progress Notes (Signed)
Subjective:  I acted as a Education administrator for Dr. Charlett Blake. [place], [place]  Patient ID: Latoya [name], female    DOB: 19-Nov-1955, 63 y.o.   MRN: [id number]  No chief complaint on file.   HPI  Patient is in today for a hospital follow up. She was seen for SOB and cough and is feeling much better after treatment. She has noted a pattern of developing Bronchitis each fall since 2016. No persistent SOB or cough. Some mild congestion noted. Denies CP/palp/HA/fevers/GI or GU c/o. Taking meds as prescribed  Patient Care Team: Mosie Lukes, MD as PCP - General (Family Medicine)   Past Medical History:  Diagnosis Date  . Anxiety   . Back pain   . Cancer ([place]) 12/2009   BCC, SCC  . Cervical cancer screening 12/30/2012  . Dermatitis 12/03/2007   Qualifier: Diagnosis of  By: Jerold Coombe    . Eczema 01/04/2013  . Environmental allergies   . Fatty liver   . Hyperlipidemia   . Hypertension   . Morbid obesity ([place]) 01/13/2012  . Multiple food allergies    cashew chicken, chinese, msg?  . Obesity 01/13/2012  . Overweight 01/13/2012  . Pain of toe of left foot 08/23/2014  . Preventative health care 01/04/2013  . Vitamin D deficiency     Past Surgical History:  Procedure Laterality Date  . BREAST BIOPSY Right   . dilation and currettage     lifestyle lift  . MOHS SURGERY  12/2009   basal cell     Family History  Problem Relation Age of Onset  . Heart disease Mother   . Diabetes Mother   . Hypertension Mother   . Hyperlipidemia Mother   . Heart Problems Mother   . Heart disease Father        pacer  . Diabetes Father   . Hypertension Father        weight controlled  . Obesity Father   . Hyperlipidemia Father   . Heart Problems Father   . Cancer Father   . Obesity Sister        s/p lap band  . Cancer Maternal Grandmother        pancreatic  . Cancer Paternal Grandmother 3       liver  . Psoriasis Brother   . Obesity Brother   . Obesity Brother   . Diabetes Brother        diet  controlled  . Melanoma Unknown   . Colon cancer Neg Hx     Social History   Socioeconomic History  . Marital status: Single    Spouse name: Not on file  . Number of children: Not on file  . Years of education: Not on file  . Highest education level: Not on file  Occupational History  . Occupation: self Leisure centre manager: SELF-EMPLOYED  Social Needs  . Financial resource strain: Not on file  . Food insecurity:    Worry: Not on file    Inability: Not on file  . Transportation needs:    Medical: Not on file    Non-medical: Not on file  Tobacco Use  . Smoking status: Former Research scientist (life sciences)  . Smokeless tobacco: Never Used  . Tobacco comment: quit 2008  Substance and Sexual Activity  . Alcohol use: Yes    Comment: weekly  . Drug use: No  . Sexual activity: Not Currently    Partners: Male    Comment: lives ALONE. no dietary  restrictions, works full time Insurance underwriter  Lifestyle  . Physical activity:    Days per week: Not on file    Minutes per session: Not on file  . Stress: Not on file  Relationships  . Social connections:    Talks on phone: Not on file    Gets together: Not on file    Attends religious service: Not on file    Active member of club or organization: Not on file    Attends meetings of clubs or organizations: Not on file    Relationship status: Not on file  . Intimate partner violence:    Fear of current or ex partner: Not on file    Emotionally abused: Not on file    Physically abused: Not on file    Forced sexual activity: Not on file  Other Topics Concern  . Not on file  Social History Narrative  . Not on file    Outpatient Medications Prior to Visit  Medication Sig Dispense Refill  . albuterol (VENTOLIN HFA) 108 (90 Base) MCG/ACT inhaler Inhale 2 puffs into the lungs every 6 (six) hours as needed for wheezing or shortness of breath. 1 Inhaler 3  . ALPRAZolam (XANAX) 0.25 MG tablet Take 1-2 tablets (0.25-0.5 mg total) by mouth at bedtime as  needed for sleep or anxiety. 20 tablet 1  . atorvastatin (LIPITOR) 40 MG tablet Take 1 tablet (40 mg total) by mouth daily at 6 PM. (Patient taking differently: Take 40 mg by mouth daily after breakfast. ) 90 tablet 0  . beclomethasone (QVAR) 40 MCG/ACT inhaler Inhale 2 puffs into the lungs 2 (two) times daily at 10 AM and 5 PM. 1 Inhaler 2  . cetirizine (ZYRTEC) 10 MG tablet Take 1 tablet (10 mg total) by mouth 2 (two) times daily. (Patient taking differently: Take 10 mg by mouth daily after breakfast. ) 30 tablet 11  . citalopram (CELEXA) 20 MG tablet Take 1 tablet (20 mg total) by mouth daily. 30 tablet 0  . clobetasol (TEMOVATE) 0.05 % external solution Apply 1 application topically 2 (two) times daily. (Patient taking differently: Apply 1 application topically 2 (two) times daily as needed (scalp). ) 50 mL 1  . Krill Oil 500 MG CAPS Take 1 capsule by mouth daily after breakfast.     . losartan (COZAAR) 100 MG tablet Take 1 tablet (100 mg total) by mouth daily. 90 tablet 0  . ranitidine (ZANTAC) 150 MG tablet Take 1 tablet (150 mg total) by mouth 2 (two) times daily. (Patient not taking: Reported on 12/11/2017)     No facility-administered medications prior to visit.     Allergies  Allergen Reactions  . Monosodium Glutamate Hives    Cashew Chicken    Review of Systems  Constitutional: Positive for malaise/fatigue. Negative for fever.  HENT: Positive for congestion.   Eyes: Negative for blurred vision.  Respiratory: Positive for cough. Negative for shortness of breath.   Cardiovascular: Negative for chest pain, palpitations and leg swelling.  Gastrointestinal: Negative for abdominal pain, blood in stool and nausea.  Genitourinary: Negative for dysuria and frequency.  Musculoskeletal: Negative for falls.  Skin: Negative for rash.  Neurological: Negative for dizziness, loss of consciousness and headaches.  Endo/Heme/Allergies: Negative for environmental allergies.    Psychiatric/Behavioral: Negative for depression. The patient is not nervous/anxious.        Objective:    Physical Exam  Constitutional: She is oriented to person, place, and time. She appears well-developed and well-nourished. No distress.  HENT:  Head: Normocephalic and atraumatic.  Nose: Nose normal.  Eyes: Right eye exhibits no discharge. Left eye exhibits no discharge.  Neck: Normal range of motion. Neck supple.  Cardiovascular: Normal rate and regular rhythm.  No murmur heard. Pulmonary/Chest: Effort normal and breath sounds normal.  Abdominal: Soft. Bowel sounds are normal. There is no tenderness.  Musculoskeletal: She exhibits no edema.  Neurological: She is alert and oriented to person, place, and time.  Skin: Skin is warm and dry.  Psychiatric: She has a normal mood and affect.  Nursing note and vitals reviewed.   BP 122/72 (BP Location: Left Arm, Patient Position: Sitting, Cuff Size: Normal)   Pulse 85   Resp 18   Ht 5\' 8"  (1.727 m)   Wt 193 lb 3.2 oz (87.6 kg)   LMP  (LMP Unknown) Comment: 2012 ?  SpO2 96%   BMI 29.38 kg/m  Wt Readings from Last 3 Encounters:  02/11/18 193 lb 3.2 oz (87.6 kg)  01/26/18 195 lb 12.3 oz (88.8 kg)  12/11/17 192 lb 9.6 oz (87.4 kg)   BP Readings from Last 3 Encounters:  02/11/18 122/72  01/27/18 (!) 152/93  12/11/17 120/77     Immunization History  Administered Date(s) Administered  . Influenza,inj,Quad PF,6+ Mos 05/15/2016  . Td 05/29/1995  . Tdap 12/30/2012, 01/10/2015    Health Maintenance  Topic Date Due  . INFLUENZA VACCINE  12/26/2017  . PAP SMEAR  05/16/2019  . MAMMOGRAM  12/18/2019  . COLONOSCOPY  07/18/2021  . TETANUS/TDAP  01/09/2025  . Hepatitis C Screening  Completed  . HIV Screening  Completed    Lab Results  Component Value Date   WBC 10.9 (H) 01/30/2018   HGB 12.8 01/30/2018   HCT 37.7 01/30/2018   PLT 224.0 01/30/2018   GLUCOSE 96 01/30/2018   CHOL 158 12/17/2017   TRIG 99.0 12/17/2017    HDL 76.60 12/17/2017   LDLDIRECT 144.9 12/23/2008   LDLCALC 61 12/17/2017   ALT 48 (H) 01/27/2018   AST 40 01/27/2018   NA 139 01/30/2018   K 3.4 (L) 01/30/2018   CL 101 01/30/2018   CREATININE 0.88 01/30/2018   BUN 18 01/30/2018   CO2 27 01/30/2018   TSH 1.040 07/04/2016   HGBA1C 5.4 07/04/2016    Lab Results  Component Value Date   TSH 1.040 07/04/2016   Lab Results  Component Value Date   WBC 10.9 (H) 01/30/2018   HGB 12.8 01/30/2018   HCT 37.7 01/30/2018   MCV 99.3 01/30/2018   PLT 224.0 01/30/2018   Lab Results  Component Value Date   NA 139 01/30/2018   K 3.4 (L) 01/30/2018   CO2 27 01/30/2018   GLUCOSE 96 01/30/2018   BUN 18 01/30/2018   CREATININE 0.88 01/30/2018   BILITOT 0.5 01/27/2018   ALKPHOS 58 01/27/2018   AST 40 01/27/2018   ALT 48 (H) 01/27/2018   PROT 7.1 01/27/2018   ALBUMIN 3.9 01/27/2018   CALCIUM 9.2 01/30/2018   ANIONGAP 9 01/27/2018   GFR 69.02 01/30/2018   Lab Results  Component Value Date   CHOL 158 12/17/2017   Lab Results  Component Value Date   HDL 76.60 12/17/2017   Lab Results  Component Value Date   LDLCALC 61 12/17/2017   Lab Results  Component Value Date   TRIG 99.0 12/17/2017   Lab Results  Component Value Date   CHOLHDL 2 12/17/2017   Lab Results  Component Value Date   HGBA1C 5.4  07/04/2016         Assessment & Plan:   Problem List Items Addressed This Visit    Hyperlipidemia, mixed   Essential hypertension    Well controlled, no changes to meds. Encouraged heart healthy diet such as the DASH diet and exercise as tolerated.       Asthma exacerbation    Asthma vs COPD exacerbation tends to have trouble each fall suggesting an allergic component. Will treat this and refer to pulmonology for further consideration      Relevant Medications   montelukast (SINGULAIR) 10 MG tablet   Environmental allergies    Encouraged Zyrtec once to twice daily, Singulair and Flonase daily especially starting each  August through the fall      History of cigarette smoking    H/o 1/2 to 1 ppd no longer smoking quit in 2008       Other Visit Diagnoses    Bronchitis, allergic, unspecified asthma severity, uncomplicated    -  Primary   Relevant Orders   Ambulatory referral to Pulmonology      I have discontinued Blase Mess ranitidine. I am also having her start on montelukast and fluticasone. Additionally, I am having her maintain her albuterol, Krill Oil, ALPRAZolam, clobetasol, cetirizine, losartan, atorvastatin, citalopram, and beclomethasone.  Meds ordered this encounter  Medications  . montelukast (SINGULAIR) 10 MG tablet    Sig: Take 1 tablet (10 mg total) by mouth at bedtime.    Dispense:  30 tablet    Refill:  3  . fluticasone (FLONASE) 50 MCG/ACT nasal spray    Sig: Place 2 sprays into both nostrils daily.    Dispense:  16 g    Refill:  6     Penni Homans, MD

## 2018-02-16 DIAGNOSIS — Z87891 Personal history of nicotine dependence: Secondary | ICD-10-CM | POA: Insufficient documentation

## 2018-02-16 DIAGNOSIS — Z9109 Other allergy status, other than to drugs and biological substances: Secondary | ICD-10-CM | POA: Insufficient documentation

## 2018-02-16 NOTE — Assessment & Plan Note (Signed)
H/o 1/2 to 1 ppd no longer smoking quit in 2008

## 2018-02-16 NOTE — Assessment & Plan Note (Signed)
Asthma vs COPD exacerbation tends to have trouble each fall suggesting an allergic component. Will treat this and refer to pulmonology for further consideration

## 2018-02-16 NOTE — Assessment & Plan Note (Signed)
Encouraged Zyrtec once to twice daily, Singulair and Flonase daily especially starting each August through the fall

## 2018-02-16 NOTE — Assessment & Plan Note (Signed)
Well controlled, no changes to meds. Encouraged heart healthy diet such as the DASH diet and exercise as tolerated.  °

## 2018-02-24 ENCOUNTER — Other Ambulatory Visit: Payer: Self-pay | Admitting: Family Medicine

## 2018-02-24 DIAGNOSIS — F32A Depression, unspecified: Secondary | ICD-10-CM

## 2018-02-24 DIAGNOSIS — F419 Anxiety disorder, unspecified: Principal | ICD-10-CM

## 2018-02-24 DIAGNOSIS — F329 Major depressive disorder, single episode, unspecified: Secondary | ICD-10-CM

## 2018-02-24 MED ORDER — CITALOPRAM HYDROBROMIDE 20 MG PO TABS: 20.0000 mg | ORAL_TABLET | Freq: Every day | ORAL | 5 refills | Status: DC

## 2018-02-24 NOTE — Telephone Encounter (Signed)
Copied from [place] [id number]. Topic: Quick Communication - Rx Refill/Question >> Feb 24, 2018 12:25 PM Judyann Munson wrote: Medication: citalopram (CELEXA) 20 MG tablet     Has the patient contacted their pharmacy? No  Preferred Pharmacy (with phone number or street name): [facility] [address] [place], [place] - [address] [phone] (Phone) [phone] (Fax)    Agent: Please be advised that RX refills may take up to 3 business days. We ask that you follow-up with your pharmacy.

## 2018-02-24 NOTE — Telephone Encounter (Signed)
Rx sent 

## 2018-02-25 ENCOUNTER — Encounter: Payer: Self-pay | Admitting: Pulmonary Disease

## 2018-02-25 ENCOUNTER — Ambulatory Visit (INDEPENDENT_AMBULATORY_CARE_PROVIDER_SITE_OTHER): Payer: No Typology Code available for payment source | Admitting: Pulmonary Disease

## 2018-02-25 ENCOUNTER — Other Ambulatory Visit (INDEPENDENT_AMBULATORY_CARE_PROVIDER_SITE_OTHER): Payer: No Typology Code available for payment source

## 2018-02-25 VITALS — BP 123/84 | HR 75 | Ht 68.0 in | Wt 194.0 lb

## 2018-02-25 DIAGNOSIS — Z9109 Other allergy status, other than to drugs and biological substances: Secondary | ICD-10-CM

## 2018-02-25 DIAGNOSIS — R05 Cough: Secondary | ICD-10-CM

## 2018-02-25 DIAGNOSIS — Z23 Encounter for immunization: Secondary | ICD-10-CM

## 2018-02-25 DIAGNOSIS — J411 Mucopurulent chronic bronchitis: Secondary | ICD-10-CM

## 2018-02-25 DIAGNOSIS — R059 Cough, unspecified: Secondary | ICD-10-CM

## 2018-02-25 DIAGNOSIS — Z87891 Personal history of nicotine dependence: Secondary | ICD-10-CM

## 2018-02-25 NOTE — Progress Notes (Signed)
Synopsis: Referred in September 2019 for possible asthma vs COPD; smoked 1ppd for 30-35 years, quit 2008.   Subjective:   PATIENT ID: Latoya [name] GENDER: female DOB: 04/15/1956, MRN: [id number]   HPI  Chief Complaint  Patient presents with  . Pulm Consult    Referred by Dr. Charlett Blake for chronic bronchitis/cough. Was in the hospital on 01/26/18 and was told that she had a COPD exacerbation. States she has been feeling well since being discharged.       Recurrent episodes of "chronic cough" > over labor day weekend she had recurrence of this and she went to the ER > she had just returned from [facility]  > she required breathing treatments, was found to have a low O2 saturation (79%) > she had a thorough work up and was treated with oxygen and breathing treatments > stayed in the hospital for the night > she was discharged the next day > she followed up with Dr. Randel Pigg and is taking QVar > took prednisone after the hospital > has been been put on Singulair as well as Zyrtec > Since the hospitalization she is "fine" without any problems > this was the most severe of the recurrent episodes of cough that have occurred since hospitalization > she says that when she was sick she had a lot of wheezing > she produced some mucus when she was sick.  > typically her spells happen in the fall > the first was a few years ago in a minute clinic in [facility] and she was diagnosed with bronchitis > she was prescribed QVar with this initial diagnosis.   She notes itchy eyes, runny nose worse in the fall. This is worse in the fall.    When not sick:  She wonders if she has a little dyspnea when she is climbing hills.  She has noticed that climbing stairs will make her short of breath, more in the last year.  She doesn't feel congestion or mucus production.    She used to smoke, 1 pack per day, quit in 2008.  She started smoking as a teenager.  She has worked in [employer] for  most of her career.  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  She says that in July 2018 she had a "sewer back up" in her house and has had a lot of moisture in her basement. She has been in the house for 5 years.  She had to have Service Master come to the house to deal with this.  She had "raw sewage" in her kitchen and asbestos under the flooring. She was living in the house when it was remediated, but she was out of the house for the bulk of the work for 6 weeks. She has a dog which is 53 years old.      Past Medical History:  Diagnosis Date  . Anxiety   . Back pain   . Cancer ([place]) 12/2009   BCC, SCC  . Cervical cancer screening 12/30/2012  . Dermatitis 12/03/2007   Qualifier: Diagnosis of  By: Jerold Coombe    . Eczema 01/04/2013  . Environmental allergies   . Fatty liver   . Hyperlipidemia   . Hypertension   . Morbid obesity ([place]) 01/13/2012  . Multiple food allergies    cashew chicken, chinese, msg?  . Obesity 01/13/2012  . Overweight 01/13/2012  . Pain of toe of left foot 08/23/2014  . Preventative health care 01/04/2013  .  Vitamin D deficiency      Family History  Problem Relation Age of Onset  . Heart disease Mother   . Diabetes Mother   . Hypertension Mother   . Hyperlipidemia Mother   . Heart Problems Mother   . Heart disease Father        pacer  . Diabetes Father   . Hypertension Father        weight controlled  . Obesity Father   . Hyperlipidemia Father   . Heart Problems Father   . Cancer Father   . Obesity Sister        s/p lap band  . Cancer Maternal Grandmother        pancreatic  . Cancer Paternal Grandmother 49       liver  . Psoriasis Brother   . Obesity Brother   . Obesity Brother   . Diabetes Brother        diet controlled  . Melanoma Unknown   . Colon cancer Neg Hx      Social History   Socioeconomic History  . Marital status: Single    Spouse name: Not on file  . Number of children: Not on file  . Years of education: Not on file  .  Highest education level: Not on file  Occupational History  . Occupation: self Leisure centre manager: SELF-EMPLOYED  Social Needs  . Financial resource strain: Not on file  . Food insecurity:    Worry: Not on file    Inability: Not on file  . Transportation needs:    Medical: Not on file    Non-medical: Not on file  Tobacco Use  . Smoking status: Former Research scientist (life sciences)  . Smokeless tobacco: Never Used  . Tobacco comment: quit 2008  Substance and Sexual Activity  . Alcohol use: Yes    Comment: weekly  . Drug use: No  . Sexual activity: Not Currently    Partners: Male    Comment: lives ALONE. no dietary restrictions, works full time Insurance underwriter  Lifestyle  . Physical activity:    Days per week: Not on file    Minutes per session: Not on file  . Stress: Not on file  Relationships  . Social connections:    Talks on phone: Not on file    Gets together: Not on file    Attends religious service: Not on file    Active member of club or organization: Not on file    Attends meetings of clubs or organizations: Not on file    Relationship status: Not on file  . Intimate partner violence:    Fear of current or ex partner: Not on file    Emotionally abused: Not on file    Physically abused: Not on file    Forced sexual activity: Not on file  Other Topics Concern  . Not on file  Social History Narrative  . Not on file     Allergies  Allergen Reactions  . Monosodium Glutamate Hives    Cashew Chicken     Outpatient Medications Prior to Visit  Medication Sig Dispense Refill  . albuterol (VENTOLIN HFA) 108 (90 Base) MCG/ACT inhaler Inhale 2 puffs into the lungs every 6 (six) hours as needed for wheezing or shortness of breath. 1 Inhaler 3  . ALPRAZolam (XANAX) 0.25 MG tablet Take 1-2 tablets (0.25-0.5 mg total) by mouth at bedtime as needed for sleep or anxiety. 20 tablet 1  . atorvastatin (LIPITOR) 40 MG tablet Take  1 tablet (40 mg total) by mouth daily at 6 PM. (Patient taking  differently: Take 40 mg by mouth daily after breakfast. ) 90 tablet 0  . beclomethasone (QVAR) 40 MCG/ACT inhaler Inhale 2 puffs into the lungs 2 (two) times daily at 10 AM and 5 PM. 1 Inhaler 2  . cetirizine (ZYRTEC) 10 MG tablet Take 1 tablet (10 mg total) by mouth 2 (two) times daily. (Patient taking differently: Take 10 mg by mouth daily after breakfast. ) 30 tablet 11  . citalopram (CELEXA) 20 MG tablet TAKE ONE TABLET BY MOUTH DAILY 30 tablet 0  . clobetasol (TEMOVATE) 0.05 % external solution Apply 1 application topically 2 (two) times daily. (Patient taking differently: Apply 1 application topically 2 (two) times daily as needed (scalp). ) 50 mL 1  . fluticasone (FLONASE) 50 MCG/ACT nasal spray Place 2 sprays into both nostrils daily. 16 g 6  . Krill Oil 500 MG CAPS Take 1 capsule by mouth daily after breakfast.     . losartan (COZAAR) 100 MG tablet Take 1 tablet (100 mg total) by mouth daily. 90 tablet 0  . montelukast (SINGULAIR) 10 MG tablet Take 1 tablet (10 mg total) by mouth at bedtime. 30 tablet 3   No facility-administered medications prior to visit.     Review of Systems  Constitutional: Negative for chills, fever, malaise/fatigue and weight loss.  HENT: Negative for congestion, nosebleeds, sinus pain and sore throat.   Eyes: Negative for photophobia, pain and discharge.  Respiratory: Positive for cough and shortness of breath. Negative for hemoptysis, sputum production and wheezing.   Cardiovascular: Negative for chest pain, palpitations, orthopnea and leg swelling.  Gastrointestinal: Negative for abdominal pain, constipation, diarrhea, nausea and vomiting.  Genitourinary: Negative for dysuria, hematuria and urgency. Frequency: .dbmex.  Musculoskeletal: Negative for back pain, joint pain, myalgias and neck pain.  Skin: Negative for itching and rash.  Neurological: Negative for tingling, tremors, sensory change, speech change, focal weakness, seizures, weakness and headaches.    Psychiatric/Behavioral: Negative for memory loss, substance abuse and suicidal ideas. The patient is not nervous/anxious.       Objective:  Physical Exam   Vitals:   02/25/18 1059  BP: 123/84  Pulse: 75  SpO2: 95%  Weight: 194 lb (88 kg)  Height: 5\' 8"  (1.727 m)   Gen: well appearing, no acute distress HENT: NCAT, OP clear, neck supple without masses Eyes: PERRL, EOMi Lymph: no cervical lymphadenopathy PULM: CTA B CV: RRR, no mgr, no JVD GI: BS+, soft, nontender, no hsm Derm: no rash or skin breakdown MSK: normal bulk and tone Neuro: A&Ox4, CN II-XII intact, strength 5/5 in all 4 extremities Psyche: normal mood and affect   CBC    Component Value Date/Time   WBC 10.9 (H) 01/30/2018 0903   RBC 3.79 (L) 01/30/2018 0903   HGB 12.8 01/30/2018 0903   HGB 13.5 07/04/2016 1152   HCT 37.7 01/30/2018 0903   HCT 42.3 07/04/2016 1152   PLT 224.0 01/30/2018 0903   MCV 99.3 01/30/2018 0903   MCV 103 (H) 07/04/2016 1152   MCH 33.5 01/27/2018 0426   MCHC 33.9 01/30/2018 0903   RDW 15.4 01/30/2018 0903   RDW 15.8 (H) 07/04/2016 1152   LYMPHSABS 2.3 01/26/2018 1532   LYMPHSABS 1.5 07/04/2016 1152   MONOABS 0.7 01/26/2018 1532   EOSABS 0.3 01/26/2018 1532   EOSABS 0.6 (H) 07/04/2016 1152   BASOSABS 0.0 01/26/2018 1532   BASOSABS 0.1 07/04/2016 1152  Chest imaging: September 2019 CT angiogram chest images independently reviewed showing no pulmonary embolism, aortic atherosclerosis, hepatic steatosis, and biliary sludge..  Lung windows independently reviewed showing normal pulmonary parenchyma, nonspecific interlobular septal thickening in the right base which is very mild  PFT:  Labs:  Path:  Echo:  Heart Catheterization:  Records from her visit with primary care in September 2019 reviewed where she was seen for asthma versus COPD and referred to [name] for the same.  She had been hospitalized earlier in the month for shortness of breath.     Assessment & Plan:    Cough  History of cigarette smoking  Environmental allergies  Bronchitis, mucopurulent recurrent (HCC)  Discussion: This is a pleasant 62 year old former smoker who comes to my clinic today for evaluation of recurrent episodes of bronchitis.  Because of her smoking history she is at increased risk for COPD though I am pleased that she does not have wheezing on physical exam and she typically has minimal symptoms between the episodes of bronchitis.  Further, my personal review of the images from the CT scan of her chest in September did not show evidence of pulmonary parenchymal disease like emphysema.  So I am hopeful that she just has something like asthma given her repeated episodes in the fall when her allergic rhinitis is worse.  Plan: Recurrent bronchitis: I think this is due to asthmatic bronchitis I am going to check blood work for allergy testing, a lung function test and a test called and exhaled nitric oxide test This will help [name] sort out whether or not you have asthma or COPD Continue taking Qvar for now Continue Zyrtec Continue taking Singulair  Tobacco abuse: I am glad you quit smoking I am going to refer you to the lung cancer screening program  Flu shot today  We will see you back in 1 to 2 weeks to go over the results of the breathing test and the blood work     Current Outpatient Medications:  .  albuterol (VENTOLIN HFA) 108 (90 Base) MCG/ACT inhaler, Inhale 2 puffs into the lungs every 6 (six) hours as needed for wheezing or shortness of breath., Disp: 1 Inhaler, Rfl: 3 .  ALPRAZolam (XANAX) 0.25 MG tablet, Take 1-2 tablets (0.25-0.5 mg total) by mouth at bedtime as needed for sleep or anxiety., Disp: 20 tablet, Rfl: 1 .  atorvastatin (LIPITOR) 40 MG tablet, Take 1 tablet (40 mg total) by mouth daily at 6 PM. (Patient taking differently: Take 40 mg by mouth daily after breakfast. ), Disp: 90 tablet, Rfl: 0 .  beclomethasone (QVAR) 40 MCG/ACT inhaler, Inhale 2  puffs into the lungs 2 (two) times daily at 10 AM and 5 PM., Disp: 1 Inhaler, Rfl: 2 .  cetirizine (ZYRTEC) 10 MG tablet, Take 1 tablet (10 mg total) by mouth 2 (two) times daily. (Patient taking differently: Take 10 mg by mouth daily after breakfast. ), Disp: 30 tablet, Rfl: 11 .  citalopram (CELEXA) 20 MG tablet, TAKE ONE TABLET BY MOUTH DAILY, Disp: 30 tablet, Rfl: 0 .  clobetasol (TEMOVATE) 0.05 % external solution, Apply 1 application topically 2 (two) times daily. (Patient taking differently: Apply 1 application topically 2 (two) times daily as needed (scalp). ), Disp: 50 mL, Rfl: 1 .  fluticasone (FLONASE) 50 MCG/ACT nasal spray, Place 2 sprays into both nostrils daily., Disp: 16 g, Rfl: 6 .  Krill Oil 500 MG CAPS, Take 1 capsule by mouth daily after breakfast. , Disp: , Rfl:  .  losartan (COZAAR) 100 MG tablet, Take 1 tablet (100 mg total) by mouth daily., Disp: 90 tablet, Rfl: 0 .  montelukast (SINGULAIR) 10 MG tablet, Take 1 tablet (10 mg total) by mouth at bedtime., Disp: 30 tablet, Rfl: 3

## 2018-02-25 NOTE — Addendum Note (Signed)
Addended by: Valerie Salts on: 02/25/2018 11:49 AM   Modules accepted: Orders

## 2018-02-25 NOTE — Patient Instructions (Signed)
Recurrent bronchitis: I think this is due to asthmatic bronchitis I am going to check blood work for allergy testing, a lung function test and a test called and exhaled nitric oxide test This will help [name] sort out whether or not you have asthma or COPD Continue taking Qvar for now Continue Zyrtec Continue taking Singulair  Tobacco abuse: I am glad you quit smoking I am going to refer you to the lung cancer screening program  Flu shot today  We will see you back in 1 to 2 weeks to go over the results of the breathing test and the blood work

## 2018-02-26 LAB — RESPIRATORY ALLERGY PROFILE REGION II ~~LOC~~
Allergen, A. alternata, m6: <0.1 kU/L
Allergen, Cedar tree, t12: <0.1 kU/L
Allergen, D pternoyssinus,d7: <0.1 kU/L
Allergen, Mouse Urine Protein, e78: <0.1 kU/L
Allergen, Mulberry, t76: <0.1 kU/L
Allergen, Oak,t7: <0.1 kU/L
Aspergillus fumigatus, m3: <0.1 kU/L
Bermuda Grass: <0.1 kU/L
CLASS: 0
CLASS: 0
CLASS: 0
CLASS: 0
CLASS: 0
CLASS: 0
CLASS: 0
CLASS: 0
CLASS: 0
CLASS: 0
CLASS: 0
CLASS: 0
CLASS: 0
CLASS: 0
Cat Dander: <0.1 kU/L
Class: 0
Class: 0
Class: 0
Class: 0
Class: 0
Class: 0
Class: 0
Class: 0
Class: 0
Class: 0
Cockroach: <0.1 kU/L
IGE (IMMUNOGLOBULIN E), SERUM: 230 kU/L — AB (ref <=114)
Rough Pigweed  IgE: <0.1 kU/L
Timothy Grass: <0.1 kU/L

## 2018-02-26 LAB — INTERPRETATION:

## 2018-02-27 ENCOUNTER — Telehealth: Payer: Self-pay | Admitting: Pulmonary Disease

## 2018-02-27 NOTE — Telephone Encounter (Signed)
Called and spoke to patient. Gave results per Dr. Lake Bells. Patient stated that she has a PFT scheduled for 03/03/18 at 4pm here in [place] office. I attempted to reschedule so that she could have an OV following so that she could also have feno done at that time but patient stated that she is in the middle of her busy season and cannot reschedule. She said she will just have Dr. Lake Bells advise on the results of the PFT and then make a plan from there.   Routing to Dr. Lake Bells as Juluis Rainier. Nothing further needed at this time.

## 2018-02-27 NOTE — Telephone Encounter (Signed)
Patient also asking about exhale nitric oxide test BQ had in her AVS to be scheduled.  I have scheduled her PFT with her.

## 2018-02-27 NOTE — Telephone Encounter (Signed)
noted 

## 2018-02-28 LAB — CBC WITH DIFFERENTIAL/PLATELET
BASOS: 1 %
Basophils Absolute: 0 10*3/uL (ref 0.0–0.2)
EOS (ABSOLUTE): 0.4 10*3/uL (ref 0.0–0.4)
EOS: 8 %
HEMATOCRIT: 40 % (ref 34.0–46.6)
Hemoglobin: 13.3 g/dL (ref 11.1–15.9)
IMMATURE GRANULOCYTES: 0 %
Immature Grans (Abs): 0 10*3/uL (ref 0.0–0.1)
LYMPHS ABS: 1.5 10*3/uL (ref 0.7–3.1)
Lymphs: 29 %
MCH: 33.6 pg — ABNORMAL HIGH (ref 26.6–33.0)
MCHC: 33.3 g/dL (ref 31.5–35.7)
MCV: 101 fL — AB (ref 79–97)
MONOCYTES: 10 %
MONOS ABS: 0.5 10*3/uL (ref 0.1–0.9)
Neutrophils Absolute: 2.7 10*3/uL (ref 1.4–7.0)
Neutrophils: 52 %
Platelets: 260 10*3/uL (ref 150–450)
RBC: 3.96 x10E6/uL (ref 3.77–5.28)
RDW: 15 % (ref 12.3–15.4)
WBC: 5.3 10*3/uL (ref 3.4–10.8)

## 2018-02-28 LAB — IGE: IgE (Immunoglobulin E), Serum: 242 IU/mL (ref 6–495)

## 2018-03-03 ENCOUNTER — Ambulatory Visit: Payer: No Typology Code available for payment source

## 2018-03-03 DIAGNOSIS — R05 Cough: Secondary | ICD-10-CM

## 2018-03-03 DIAGNOSIS — R059 Cough, unspecified: Secondary | ICD-10-CM

## 2018-03-03 LAB — PULMONARY FUNCTION TEST
DL/VA % PRED: 84 %
DL/VA: 4.37 ml/min/mmHg/L
DLCO UNC % PRED: 71 %
DLCO cor % pred: 71 %
DLCO cor: 20.32 ml/min/mmHg
DLCO unc: 20.26 ml/min/mmHg
FEF 25-75 POST: 1.65 L/s
FEF 25-75 Pre: 1.82 L/sec
FEF2575-%Change-Post: -9 %
FEF2575-%PRED-POST: 68 %
FEF2575-%Pred-Pre: 75 %
FEV1-%Change-Post: 0 %
FEV1-%Pred-Post: 76 %
FEV1-%Pred-Pre: 77 %
FEV1-PRE: 2.15 L
FEV1-Post: 2.13 L
FEV1FVC-%Change-Post: -3 %
FEV1FVC-%PRED-PRE: 100 %
FEV6-%CHANGE-POST: 2 %
FEV6-%PRED-POST: 82 %
FEV6-%Pred-Pre: 80 %
FEV6-POST: 2.85 L
FEV6-Pre: 2.78 L
FEV6FVC-%CHANGE-POST: 0 %
FEV6FVC-%PRED-POST: 104 %
FEV6FVC-%Pred-Pre: 104 %
FVC-%CHANGE-POST: 2 %
FVC-%PRED-POST: 79 %
FVC-%Pred-Pre: 77 %
FVC-POST: 2.85 L
FVC-Pre: 2.78 L
POST FEV1/FVC RATIO: 75 %
PRE FEV6/FVC RATIO: 100 %
Post FEV6/FVC ratio: 100 %
Pre FEV1/FVC ratio: 77 %
RV % PRED: 113 %
RV: 2.5 L
TLC % pred: 97 %
TLC: 5.35 L

## 2018-03-28 ENCOUNTER — Other Ambulatory Visit: Payer: Self-pay | Admitting: Family Medicine

## 2018-03-28 DIAGNOSIS — E785 Hyperlipidemia, unspecified: Secondary | ICD-10-CM

## 2018-03-28 DIAGNOSIS — I10 Essential (primary) hypertension: Secondary | ICD-10-CM

## 2018-03-28 MED ORDER — LOSARTAN POTASSIUM 100 MG PO TABS: 100.0000 mg | ORAL_TABLET | Freq: Every day | ORAL | 1 refills | Status: DC

## 2018-03-28 MED ORDER — ATORVASTATIN CALCIUM 40 MG PO TABS: 40.0000 mg | ORAL_TABLET | Freq: Every day | ORAL | 1 refills | Status: DC

## 2018-03-28 NOTE — Telephone Encounter (Signed)
Copied from [place] [id number]. Topic: Quick Communication - Rx Refill/Question >> Mar 28, 2018  4:01 PM Reyne Dumas L wrote: Medication:  losartan (COZAAR) 100 MG tablet atorvastatin (LIPITOR) 40 MG tablet  Has the patient contacted their pharmacy? Yes - states they have no refills on file.  Pt states she only has two pills left. (Agent: If no, request that the patient contact the pharmacy for the refill.) (Agent: If yes, when and what did the pharmacy advise?)  Preferred Pharmacy (with phone number or street name): [facility] [address] [place], [place] - [address] [phone] (Phone) [phone] (Fax)  Agent: Please be advised that RX refills may take up to 3 business days. We ask that you follow-up with your pharmacy.

## 2018-04-15 ENCOUNTER — Ambulatory Visit (INDEPENDENT_AMBULATORY_CARE_PROVIDER_SITE_OTHER): Payer: No Typology Code available for payment source | Admitting: Adult Health

## 2018-04-15 ENCOUNTER — Encounter: Payer: Self-pay | Admitting: Adult Health

## 2018-04-15 VITALS — BP 114/72 | HR 83 | Ht 68.0 in | Wt 199.4 lb

## 2018-04-15 DIAGNOSIS — R05 Cough: Secondary | ICD-10-CM | POA: Diagnosis not present

## 2018-04-15 DIAGNOSIS — J45901 Unspecified asthma with (acute) exacerbation: Secondary | ICD-10-CM

## 2018-04-15 DIAGNOSIS — R059 Cough, unspecified: Secondary | ICD-10-CM

## 2018-04-15 LAB — NITRIC OXIDE: Nitric Oxide: 29

## 2018-04-15 MED ORDER — BUDESONIDE-FORMOTEROL FUMARATE 80-4.5 MCG/ACT IN AERO: 2.0000 | INHALATION_SPRAY | Freq: Two times a day (BID) | RESPIRATORY_TRACT | 3 refills | Status: DC

## 2018-04-15 MED ORDER — BUDESONIDE-FORMOTEROL FUMARATE 80-4.5 MCG/ACT IN AERO: 2.0000 | INHALATION_SPRAY | Freq: Two times a day (BID) | RESPIRATORY_TRACT | 0 refills | Status: DC

## 2018-04-15 NOTE — Assessment & Plan Note (Signed)
Symptoms are consistent with mild persistent asthma with allergic rhinitis.  Will treat for possible asthma triggers with postnasal drainage and GERD.  Control for cough.  And escalate asthma control medicines.  Plan  Patient Instructions  Stop QVAR . Begin Symbicort 80 2 puffs Twice daily  , rinse after use.  Use Zyrtec 10mg  At bedtime  .  Continue on Flonase 2 puffs daily  Continue on Singulair daily .  Delsym 2 tsp Twice daily  As needed   No mint products.  Try to not to clear throat  Begin Zantac 75mg  in am and At bedtime   Hold fish oil/krill oil products Follow up in 4 weeks with Dr. Lake Bells or [place] NP and As needed   Please contact office for sooner follow up if symptoms do not improve or worsen or seek emergency care

## 2018-04-15 NOTE — Progress Notes (Signed)
@Patient  ID: Latoya [name], female    DOB: 08-21-1955, 62 y.o.   MRN: [id number]  Chief Complaint  Patient presents with  . Follow-up    Cough    Referring provider: Mosie Lukes, MD  HPI: 62 year old female former smoker, quit 2008, seen for pulmonary consult February 25, 2018 for chronic bronchitis and cough felt to be consistent with asthmatic bronchitis  TEST/EVENTS :  IgE/RAST panel February 25, 2018> negative allergy profile,  IgE 230 February 25, 2018 eosinophils 400 PFT October 2019 FEV1 77%, 77, DLCO 71% FVC 77%, no significant bronchodilator response CT chest January 26, 2018- negative for PE, right  lower lobe atelectasis  04/15/2018 Follow up : Asthmatic Bronchitis  She presents for a one-month follow-up.  Patient was seen last time for a pulmonary consult for recurrent bronchitis and cough.  Prior to consult patient had been seen in the emergency room for an bronchitic exacerbation.  O2 saturations were decreased at 79% at that time.  She was treated with oxygen and breathing treatments.  Patient underwent pulmonary function testing that showed essentially normal lung function with an FEV1 at 77%, ratio 77, FVC 77% and DLCO 71%.  There was no significant bronchodilator response.  CT chest in September was negative for PE and showed right lower lobe atelectasis.  IgE was elevated at 230.  Allergy profile was negative.  Eosinophils were elevated at 400.Marland Kitchen Last visit patient was continued on Qvar Zyrtec and Singulair. Since last visit patient is feeling okay but notices that dry cough and intermittent wheezing have returned over the last few weeks . Symptoms seem to be seasonal but are developing more often last few years.  She denies any chest pain orthopnea PND leg swelling or GERD symptoms. Exhaled nitric oxide testing today is 29ppb  Allergies  Allergen Reactions  . Monosodium Glutamate Hives    Cashew Chicken    Immunization History  Administered Date(s) Administered    . Influenza,inj,Quad PF,6+ Mos 05/15/2016, 02/25/2018  . Td 05/29/1995  . Tdap 12/30/2012, 01/10/2015    Past Medical History:  Diagnosis Date  . Anxiety   . Back pain   . Cancer ([place]) 12/2009   BCC, SCC  . Cervical cancer screening 12/30/2012  . Dermatitis 12/03/2007   Qualifier: Diagnosis of  By: Jerold Coombe    . Eczema 01/04/2013  . Environmental allergies   . Fatty liver   . Hyperlipidemia   . Hypertension   . Morbid obesity ([place]) 01/13/2012  . Multiple food allergies    cashew chicken, chinese, msg?  . Obesity 01/13/2012  . Overweight 01/13/2012  . Pain of toe of left foot 08/23/2014  . Preventative health care 01/04/2013  . Vitamin D deficiency     Tobacco History: Social History   Tobacco Use  Smoking Status Former Smoker  Smokeless Tobacco Never Used  Tobacco Comment   quit 2008   Counseling given: Not Answered Comment: quit 2008   Outpatient Medications Prior to Visit  Medication Sig Dispense Refill  . albuterol (VENTOLIN HFA) 108 (90 Base) MCG/ACT inhaler Inhale 2 puffs into the lungs every 6 (six) hours as needed for wheezing or shortness of breath. 1 Inhaler 3  . ALPRAZolam (XANAX) 0.25 MG tablet Take 1-2 tablets (0.25-0.5 mg total) by mouth at bedtime as needed for sleep or anxiety. 20 tablet 1  . atorvastatin (LIPITOR) 40 MG tablet Take 1 tablet (40 mg total) by mouth daily after breakfast. 90 tablet 1  . cetirizine (ZYRTEC) 10 MG  tablet Take 1 tablet (10 mg total) by mouth 2 (two) times daily. (Patient taking differently: Take 10 mg by mouth daily after breakfast. ) 30 tablet 11  . citalopram (CELEXA) 20 MG tablet TAKE ONE TABLET BY MOUTH DAILY 30 tablet 0  . clobetasol (TEMOVATE) 0.05 % external solution Apply 1 application topically 2 (two) times daily. (Patient taking differently: Apply 1 application topically 2 (two) times daily as needed (scalp). ) 50 mL 1  . fluticasone (FLONASE) 50 MCG/ACT nasal spray Place 2 sprays into both nostrils daily. 16 g 6   . Krill Oil 500 MG CAPS Take 1 capsule by mouth daily after breakfast.     . losartan (COZAAR) 100 MG tablet Take 1 tablet (100 mg total) by mouth daily. 90 tablet 1  . montelukast (SINGULAIR) 10 MG tablet Take 1 tablet (10 mg total) by mouth at bedtime. 30 tablet 3  . beclomethasone (QVAR) 40 MCG/ACT inhaler Inhale 2 puffs into the lungs 2 (two) times daily at 10 AM and 5 PM. 1 Inhaler 2   No facility-administered medications prior to visit.      Review of Systems  Constitutional:   No  weight loss, night sweats,  Fevers, chills, fatigue, or  lassitude.  HEENT:   No headaches,  Difficulty swallowing,  Tooth/dental problems, or  Sore throat,                No sneezing, itching, ear ache,  +nasal congestion, post nasal drip,   CV:  No chest pain,  Orthopnea, PND, swelling in lower extremities, anasarca, dizziness, palpitations, syncope.   GI  No heartburn, indigestion, abdominal pain, nausea, vomiting, diarrhea, change in bowel habits, loss of appetite, bloody stools.   Resp:  .  No chest wall deformity  Skin: no rash or lesions.  GU: no dysuria, change in color of urine, no urgency or frequency.  No flank pain, no hematuria   MS:  No joint pain or swelling.  No decreased range of motion.  No back pain.    Physical Exam  BP 114/72 (BP Location: Left Arm, Cuff Size: Large)   Pulse 83   Ht 5\' 8"  (1.727 m)   Wt 199 lb 6.4 oz (90.4 kg)   LMP  (LMP Unknown) Comment: 2012 ?  SpO2 95%   BMI 30.32 kg/m   GEN: A/Ox3; pleasant , NAD, obese    HEENT:  [place]/AT,  EACs-clear, TMs-wnl, NOSE-clear drainage , THROAT-clear, no lesions, no postnasal drip or exudate noted.   NECK:  Supple w/ fair ROM; no JVD; normal carotid impulses w/o bruits; no thyromegaly or nodules palpated; no lymphadenopathy.    RESP  Clear  P & A; w/o, wheezes/ rales/ or rhonchi. no accessory muscle use, no dullness to percussion  CARD:  RRR, no m/r/g, no peripheral edema, pulses intact, no cyanosis or  clubbing.  GI:   Soft & nt; nml bowel sounds; no organomegaly or masses detected.   Musco: Warm bil, no deformities or joint swelling noted.   Neuro: alert, no focal deficits noted.    Skin: Warm, no lesions or rashes    Lab Results:  CBC    Component Value Date/Time   WBC 5.3 02/25/2018 1201   WBC 10.9 (H) 01/30/2018 0903   RBC 3.96 02/25/2018 1201   RBC 3.79 (L) 01/30/2018 0903   HGB 13.3 02/25/2018 1201   HCT 40.0 02/25/2018 1201   PLT 260 02/25/2018 1201   MCV 101 (H) 02/25/2018 1201   MCH  33.6 (H) 02/25/2018 1201   MCH 33.5 01/27/2018 0426   MCHC 33.3 02/25/2018 1201   MCHC 33.9 01/30/2018 0903   RDW 15.0 02/25/2018 1201   LYMPHSABS 1.5 02/25/2018 1201   MONOABS 0.7 01/26/2018 1532   EOSABS 0.4 02/25/2018 1201   BASOSABS 0.0 02/25/2018 1201    BMET    Component Value Date/Time   NA 139 01/30/2018 0903   NA 141 07/04/2016 1152   K 3.4 (L) 01/30/2018 0903   CL 101 01/30/2018 0903   CO2 27 01/30/2018 0903   GLUCOSE 96 01/30/2018 0903   BUN 18 01/30/2018 0903   BUN 12 07/04/2016 1152   CREATININE 0.88 01/30/2018 0903   CREATININE 0.92 12/30/2012 0816   CALCIUM 9.2 01/30/2018 0903   GFRNONAA >60 01/27/2018 0426   GFRAA >60 01/27/2018 0426    BNP    Component Value Date/Time   BNP 128.0 (H) 01/26/2018 1532    ProBNP No results found for: PROBNP  Imaging: No results found.    PFT Results Latest Ref Rng & Units 03/03/2018  FVC-Pre L 2.78  FVC-Predicted Pre % 77  FVC-Post L 2.85  FVC-Predicted Post % 79  Pre FEV1/FVC % % 77  Post FEV1/FCV % % 75  FEV1-Pre L 2.15  FEV1-Predicted Pre % 77  FEV1-Post L 2.13  DLCO UNC% % 71  DLCO COR %Predicted % 84  TLC L 5.35  TLC % Predicted % 97  RV % Predicted % 113    Lab Results  Component Value Date   NITRICOXIDE 29 04/15/2018        Assessment & Plan:   Asthma exacerbation Symptoms are consistent with mild persistent asthma with allergic rhinitis.  Will treat for possible asthma triggers  with postnasal drainage and GERD.  Control for cough.  And escalate asthma control medicines.  Plan  Patient Instructions  Stop QVAR . Begin Symbicort 80 2 puffs Twice daily  , rinse after use.  Use Zyrtec 10mg  At bedtime  .  Continue on Flonase 2 puffs daily  Continue on Singulair daily .  Delsym 2 tsp Twice daily  As needed   No mint products.  Try to not to clear throat  Begin Zantac 75mg  in am and At bedtime   Hold fish oil/krill oil products Follow up in 4 weeks with Dr. Lake Bells or Foster Sonnier NP and As needed   Please contact office for sooner follow up if symptoms do not improve or worsen or seek emergency care           Rexene Edison, NP 04/15/2018

## 2018-04-15 NOTE — Patient Instructions (Addendum)
Stop QVAR . Begin Symbicort 80 2 puffs Twice daily  , rinse after use.  Use Zyrtec 10mg  At bedtime  .  Continue on Flonase 2 puffs daily  Continue on Singulair daily .  Delsym 2 tsp Twice daily  As needed   No mint products.  Try to not to clear throat  Begin Zantac 75mg  in am and At bedtime   Hold fish oil/krill oil products Follow up in 4 weeks with Dr. Lake Bells or Amalio Loe NP and As needed   Please contact office for sooner follow up if symptoms do not improve or worsen or seek emergency care

## 2018-04-16 NOTE — Progress Notes (Signed)
Reviewed, agree 

## 2018-05-13 ENCOUNTER — Ambulatory Visit: Payer: No Typology Code available for payment source | Admitting: Adult Health

## 2018-05-15 ENCOUNTER — Encounter: Payer: Self-pay | Admitting: Adult Health

## 2018-05-15 ENCOUNTER — Ambulatory Visit (INDEPENDENT_AMBULATORY_CARE_PROVIDER_SITE_OTHER): Payer: No Typology Code available for payment source | Admitting: Adult Health

## 2018-05-15 DIAGNOSIS — J4530 Mild persistent asthma, uncomplicated: Secondary | ICD-10-CM

## 2018-05-15 NOTE — Patient Instructions (Signed)
Continue on Symbicort 80 2 puffs Twice daily  , rinse after use. (call back if need to change to generic inhaler)  Use Zyrtec 10mg  At bedtime  .  Continue on Flonase 2 puffs daily  Continue on Singulair daily .  Continue on Delsym 2 tsp Twice daily  As needed   No mint products.  Try to not to clear throat  Continue on Pepcid 20mg   am and At bedtime   Hold fish oil/krill oil products Follow up in 2-3 months Dr. Lake Bells or Parrett NP and As needed  In [facility]  Please contact office for sooner follow up if symptoms do not improve or worsen or seek emergency care

## 2018-05-15 NOTE — Assessment & Plan Note (Signed)
Mild persistent asthma Improved control on Symbicort.  Will continue on trigger prevention with GERD and rhinitis medications.  Asthma action plan reviewed.  Plan  Patient Instructions  Continue on Symbicort 80 2 puffs Twice daily  , rinse after use. (call back if need to change to generic inhaler)  Use Zyrtec 10mg  At bedtime  .  Continue on Flonase 2 puffs daily  Continue on Singulair daily .  Continue on Delsym 2 tsp Twice daily  As needed   No mint products.  Try to not to clear throat  Continue on Pepcid 20mg   am and At bedtime   Hold fish oil/krill oil products Follow up in 2-3 months Dr. Lake Bells or Parrett NP and As needed  In [facility]  Please contact office for sooner follow up if symptoms do not improve or worsen or seek emergency care

## 2018-05-15 NOTE — Progress Notes (Signed)
Reviewed, agree 

## 2018-05-15 NOTE — Progress Notes (Signed)
@Patient  ID: Latoya [name], female    DOB: 1955/12/29, 62 y.o.   MRN: [id number]  Chief Complaint  Patient presents with  . Follow-up    Cough     Referring provider: Mosie Lukes, MD  HPI: 62 year old female former smoker, quit 2008, seen for pulmonary consult February 25, 2018 for chronic bronchitis and cough felt to be consistent with asthmatic bronchitis  TEST/EVENTS :  IgE/RAST panel February 25, 2018> negative allergy profile,  IgE 230 February 25, 2018 eosinophils 400 PFT October 2019 FEV1 77%, 77, DLCO 71% FVC 77%, no significant bronchodilator response CT chest January 26, 2018- negative for PE, right  lower lobe atelectasis  05/15/2018 Follow up : Asthmatic Bronchitis  Patient returns for a one-month follow-up.  Patient is being followed for recurrent bronchitis with asthma.  Work-up is revealed normal pulmonary function testing.  With no significant airflow obstruction or restriction.  Allergy profile was negative IgE was elevated at 230.  Eosinophils were elevated at 400.  Exhaled nitric oxide testing was minimally elevated at 29.  Patient was changed from Qvar to Symbicort last visit.  She was continued on Zyrtec, Flonase and Singulair.  Zantac was added for a possible GERD component.  Facial products were held.  Since last visit patient is feeling better with less cough and wheezing . Still has some sinus drainage and intermittent symptoms of wheezing but has not had to use albuterol  She is dealing with insurance coverage and is not sure what her formulary will be for 2020 .   Activity level is good   Allergies  Allergen Reactions  . Monosodium Glutamate Hives    Cashew Chicken    Immunization History  Administered Date(s) Administered  . Influenza,inj,Quad PF,6+ Mos 05/15/2016, 02/25/2018  . Td 05/29/1995  . Tdap 12/30/2012, 01/10/2015    Past Medical History:  Diagnosis Date  . Anxiety   . Back pain   . Cancer ([place]) 12/2009   BCC, SCC  . Cervical cancer  screening 12/30/2012  . Dermatitis 12/03/2007   Qualifier: Diagnosis of  By: Jerold Coombe    . Eczema 01/04/2013  . Environmental allergies   . Fatty liver   . Hyperlipidemia   . Hypertension   . Morbid obesity ([place]) 01/13/2012  . Multiple food allergies    cashew chicken, chinese, msg?  . Obesity 01/13/2012  . Overweight 01/13/2012  . Pain of toe of left foot 08/23/2014  . Preventative health care 01/04/2013  . Vitamin D deficiency     Tobacco History: Social History   Tobacco Use  Smoking Status Former Smoker  Smokeless Tobacco Never Used  Tobacco Comment   quit 2008   Counseling given: Not Answered Comment: quit 2008   Outpatient Medications Prior to Visit  Medication Sig Dispense Refill  . albuterol (VENTOLIN HFA) 108 (90 Base) MCG/ACT inhaler Inhale 2 puffs into the lungs every 6 (six) hours as needed for wheezing or shortness of breath. 1 Inhaler 3  . ALPRAZolam (XANAX) 0.25 MG tablet Take 1-2 tablets (0.25-0.5 mg total) by mouth at bedtime as needed for sleep or anxiety. 20 tablet 1  . atorvastatin (LIPITOR) 40 MG tablet Take 1 tablet (40 mg total) by mouth daily after breakfast. 90 tablet 1  . budesonide-formoterol (SYMBICORT) 80-4.5 MCG/ACT inhaler Inhale 2 puffs into the lungs 2 (two) times daily. 1 Inhaler 0  . budesonide-formoterol (SYMBICORT) 80-4.5 MCG/ACT inhaler Inhale 2 puffs into the lungs 2 (two) times daily. 1 Inhaler 3  . cetirizine (  ZYRTEC) 10 MG tablet Take 1 tablet (10 mg total) by mouth 2 (two) times daily. (Patient taking differently: Take 10 mg by mouth daily after breakfast. ) 30 tablet 11  . citalopram (CELEXA) 20 MG tablet TAKE ONE TABLET BY MOUTH DAILY 30 tablet 0  . clobetasol (TEMOVATE) 0.05 % external solution Apply 1 application topically 2 (two) times daily. (Patient taking differently: Apply 1 application topically 2 (two) times daily as needed (scalp). ) 50 mL 1  . fluticasone (FLONASE) 50 MCG/ACT nasal spray Place 2 sprays into both nostrils  daily. 16 g 6  . Krill Oil 500 MG CAPS Take 1 capsule by mouth daily after breakfast.     . losartan (COZAAR) 100 MG tablet Take 1 tablet (100 mg total) by mouth daily. 90 tablet 1  . montelukast (SINGULAIR) 10 MG tablet Take 1 tablet (10 mg total) by mouth at bedtime. 30 tablet 3   No facility-administered medications prior to visit.      Review of Systems  Constitutional:   No  weight loss, night sweats,  Fevers, chills, fatigue, or  lassitude.  HEENT:   No headaches,  Difficulty swallowing,  Tooth/dental problems, or  Sore throat,                No sneezing, itching, ear ache, + nasal congestion, post nasal drip,   CV:  No chest pain,  Orthopnea, PND, swelling in lower extremities, anasarca, dizziness, palpitations, syncope.   GI  No heartburn, indigestion, abdominal pain, nausea, vomiting, diarrhea, change in bowel habits, loss of appetite, bloody stools.   Resp:  No chest wall deformity  Skin: no rash or lesions.  GU: no dysuria, change in color of urine, no urgency or frequency.  No flank pain, no hematuria   MS:  No joint pain or swelling.  No decreased range of motion.  No back pain.    Physical Exam  BP 130/87 (BP Location: Left Arm, Cuff Size: Normal)   Pulse 86   Ht 5\' 8"  (1.727 m)   Wt 200 lb (90.7 kg)   LMP  (LMP Unknown) Comment: 2012 ?  SpO2 94%   BMI 30.41 kg/m   GEN: A/Ox3; pleasant , NAD, well nourished    HEENT:  [place]/AT,  EACs-clear, TMs-wnl, NOSE-clear drainage , THROAT-clear, no lesions, no postnasal drip or exudate noted.   NECK:  Supple w/ fair ROM; no JVD; normal carotid impulses w/o bruits; no thyromegaly or nodules palpated; no lymphadenopathy.    RESP  Clear  P & A; w/o, wheezes/ rales/ or rhonchi. no accessory muscle use, no dullness to percussion  CARD:  RRR, no m/r/g, no peripheral edema, pulses intact, no cyanosis or clubbing.  GI:   Soft & nt; nml bowel sounds; no organomegaly or masses detected.   Musco: Warm bil, no deformities or  joint swelling noted.   Neuro: alert, no focal deficits noted.    Skin: Warm, no lesions or rashes    Lab Results:  CBC    Component Value Date/Time   WBC 5.3 02/25/2018 1201   WBC 10.9 (H) 01/30/2018 0903   RBC 3.96 02/25/2018 1201   RBC 3.79 (L) 01/30/2018 0903   HGB 13.3 02/25/2018 1201   HCT 40.0 02/25/2018 1201   PLT 260 02/25/2018 1201   MCV 101 (H) 02/25/2018 1201   MCH 33.6 (H) 02/25/2018 1201   MCH 33.5 01/27/2018 0426   MCHC 33.3 02/25/2018 1201   MCHC 33.9 01/30/2018 0903   RDW 15.0  02/25/2018 1201   LYMPHSABS 1.5 02/25/2018 1201   MONOABS 0.7 01/26/2018 1532   EOSABS 0.4 02/25/2018 1201   BASOSABS 0.0 02/25/2018 1201    BMET    Component Value Date/Time   NA 139 01/30/2018 0903   NA 141 07/04/2016 1152   K 3.4 (L) 01/30/2018 0903   CL 101 01/30/2018 0903   CO2 27 01/30/2018 0903   GLUCOSE 96 01/30/2018 0903   BUN 18 01/30/2018 0903   BUN 12 07/04/2016 1152   CREATININE 0.88 01/30/2018 0903   CREATININE 0.92 12/30/2012 0816   CALCIUM 9.2 01/30/2018 0903   GFRNONAA >60 01/27/2018 0426   GFRAA >60 01/27/2018 0426    BNP    Component Value Date/Time   BNP 128.0 (H) 01/26/2018 1532    ProBNP No results found for: PROBNP  Imaging: No results found.    PFT Results Latest Ref Rng & Units 03/03/2018  FVC-Pre L 2.78  FVC-Predicted Pre % 77  FVC-Post L 2.85  FVC-Predicted Post % 79  Pre FEV1/FVC % % 77  Post FEV1/FCV % % 75  FEV1-Pre L 2.15  FEV1-Predicted Pre % 77  FEV1-Post L 2.13  DLCO UNC% % 71  DLCO COR %Predicted % 84  TLC L 5.35  TLC % Predicted % 97  RV % Predicted % 113    Lab Results  Component Value Date   NITRICOXIDE 29 04/15/2018        Assessment & Plan:   Asthma Mild persistent asthma Improved control on Symbicort.  Will continue on trigger prevention with GERD and rhinitis medications.  Asthma action plan reviewed.  Plan  Patient Instructions  Continue on Symbicort 80 2 puffs Twice daily  , rinse after  use. (call back if need to change to generic inhaler)  Use Zyrtec 10mg  At bedtime  .  Continue on Flonase 2 puffs daily  Continue on Singulair daily .  Continue on Delsym 2 tsp Twice daily  As needed   No mint products.  Try to not to clear throat  Continue on Pepcid 20mg   am and At bedtime   Hold fish oil/krill oil products Follow up in 2-3 months Dr. Lake Bells or Deanza Upperman NP and As needed  In [facility]  Please contact office for sooner follow up if symptoms do not improve or worsen or seek emergency care           Rexene Edison, NP 05/15/2018

## 2018-06-17 ENCOUNTER — Other Ambulatory Visit: Payer: Self-pay | Admitting: Family Medicine

## 2018-06-18 ENCOUNTER — Telehealth: Payer: Self-pay | Admitting: *Deleted

## 2018-06-18 NOTE — Telephone Encounter (Signed)
Received request for Medical Records from [place]; forwarded to HIM Department via fax/SLS 01/22

## 2018-06-30 NOTE — Telephone Encounter (Signed)
Faxed 2nd request to HIM Department/SLS 02/03

## 2018-08-13 ENCOUNTER — Other Ambulatory Visit: Payer: Self-pay | Admitting: Family Medicine

## 2018-08-13 DIAGNOSIS — F419 Anxiety disorder, unspecified: Principal | ICD-10-CM

## 2018-08-13 DIAGNOSIS — F329 Major depressive disorder, single episode, unspecified: Secondary | ICD-10-CM

## 2018-08-13 DIAGNOSIS — F32A Depression, unspecified: Secondary | ICD-10-CM

## 2018-08-13 MED ORDER — CITALOPRAM HYDROBROMIDE 20 MG PO TABS: 20.0000 mg | ORAL_TABLET | Freq: Every day | ORAL | 0 refills | Status: DC

## 2018-09-12 ENCOUNTER — Other Ambulatory Visit: Payer: Self-pay | Admitting: Family Medicine

## 2018-09-12 DIAGNOSIS — F419 Anxiety disorder, unspecified: Principal | ICD-10-CM

## 2018-09-12 DIAGNOSIS — F32A Depression, unspecified: Secondary | ICD-10-CM

## 2018-09-12 DIAGNOSIS — F329 Major depressive disorder, single episode, unspecified: Secondary | ICD-10-CM

## 2018-10-10 ENCOUNTER — Other Ambulatory Visit: Payer: Self-pay | Admitting: Family Medicine

## 2018-10-10 DIAGNOSIS — F419 Anxiety disorder, unspecified: Secondary | ICD-10-CM

## 2018-10-10 DIAGNOSIS — F329 Major depressive disorder, single episode, unspecified: Secondary | ICD-10-CM

## 2018-11-09 ENCOUNTER — Other Ambulatory Visit: Payer: Self-pay | Admitting: Family Medicine

## 2018-12-09 ENCOUNTER — Other Ambulatory Visit: Payer: Self-pay | Admitting: Adult Health

## 2018-12-15 ENCOUNTER — Telehealth: Payer: Self-pay | Admitting: Family Medicine

## 2018-12-15 DIAGNOSIS — F329 Major depressive disorder, single episode, unspecified: Secondary | ICD-10-CM

## 2018-12-15 DIAGNOSIS — F419 Anxiety disorder, unspecified: Secondary | ICD-10-CM

## 2018-12-15 NOTE — Telephone Encounter (Signed)
Pt called in and scheduled appt for 7/27. Pt is out of citalopram and has 2 days of singulair. Requesting enough until appt at least.   [facility] [place], [place] - [address] [phone] (Phone) [phone] (Fax)

## 2018-12-17 ENCOUNTER — Other Ambulatory Visit: Payer: Self-pay | Admitting: Family Medicine

## 2018-12-17 MED ORDER — MONTELUKAST SODIUM 10 MG PO TABS: 10.0000 mg | ORAL_TABLET | Freq: Every day | ORAL | 0 refills | Status: DC

## 2018-12-17 MED ORDER — CITALOPRAM HYDROBROMIDE 20 MG PO TABS: 20.0000 mg | ORAL_TABLET | Freq: Every day | ORAL | 0 refills | Status: DC

## 2018-12-17 NOTE — Addendum Note (Signed)
Addended by: Magdalene Molly A on: 12/17/2018 01:04 PM   Modules accepted: Orders

## 2018-12-17 NOTE — Telephone Encounter (Signed)
Sent in a 15 day supply until her next appt. Unable to leave voicemail  Nurse triage may handle

## 2018-12-21 ENCOUNTER — Other Ambulatory Visit: Payer: Self-pay | Admitting: Family Medicine

## 2018-12-21 DIAGNOSIS — I10 Essential (primary) hypertension: Secondary | ICD-10-CM

## 2018-12-22 ENCOUNTER — Ambulatory Visit (INDEPENDENT_AMBULATORY_CARE_PROVIDER_SITE_OTHER): Payer: PRIVATE HEALTH INSURANCE | Admitting: Family Medicine

## 2018-12-22 ENCOUNTER — Other Ambulatory Visit: Payer: Self-pay

## 2018-12-22 DIAGNOSIS — R05 Cough: Secondary | ICD-10-CM | POA: Diagnosis not present

## 2018-12-22 DIAGNOSIS — E559 Vitamin D deficiency, unspecified: Secondary | ICD-10-CM

## 2018-12-22 DIAGNOSIS — R059 Cough, unspecified: Secondary | ICD-10-CM

## 2018-12-22 DIAGNOSIS — F329 Major depressive disorder, single episode, unspecified: Secondary | ICD-10-CM

## 2018-12-22 DIAGNOSIS — E785 Hyperlipidemia, unspecified: Secondary | ICD-10-CM | POA: Diagnosis not present

## 2018-12-22 DIAGNOSIS — E538 Deficiency of other specified B group vitamins: Secondary | ICD-10-CM

## 2018-12-22 DIAGNOSIS — E8881 Metabolic syndrome: Secondary | ICD-10-CM

## 2018-12-22 DIAGNOSIS — F419 Anxiety disorder, unspecified: Secondary | ICD-10-CM | POA: Diagnosis not present

## 2018-12-22 DIAGNOSIS — I10 Essential (primary) hypertension: Secondary | ICD-10-CM

## 2018-12-22 DIAGNOSIS — E782 Mixed hyperlipidemia: Secondary | ICD-10-CM

## 2018-12-22 DIAGNOSIS — J4530 Mild persistent asthma, uncomplicated: Secondary | ICD-10-CM

## 2018-12-22 MED ORDER — MONTELUKAST SODIUM 10 MG PO TABS: 10.0000 mg | ORAL_TABLET | Freq: Every day | ORAL | 0 refills | Status: DC

## 2018-12-22 MED ORDER — ALPRAZOLAM 0.25 MG PO TABS: 0.2500 mg | ORAL_TABLET | Freq: Every evening | ORAL | 2 refills | Status: DC | PRN

## 2018-12-22 MED ORDER — ALBUTEROL SULFATE HFA 108 (90 BASE) MCG/ACT IN AERS: 2.0000 | INHALATION_SPRAY | Freq: Four times a day (QID) | RESPIRATORY_TRACT | 1 refills | Status: DC | PRN

## 2018-12-22 MED ORDER — CITALOPRAM HYDROBROMIDE 20 MG PO TABS: 20.0000 mg | ORAL_TABLET | Freq: Every day | ORAL | 1 refills | Status: DC

## 2018-12-22 MED ORDER — ATORVASTATIN CALCIUM 40 MG PO TABS: 40.0000 mg | ORAL_TABLET | Freq: Every day | ORAL | 0 refills | Status: DC

## 2018-12-22 MED ORDER — LOSARTAN POTASSIUM 100 MG PO TABS: 100.0000 mg | ORAL_TABLET | Freq: Every day | ORAL | 0 refills | Status: DC

## 2018-12-23 ENCOUNTER — Telehealth: Payer: Self-pay | Admitting: Family Medicine

## 2018-12-23 NOTE — Telephone Encounter (Signed)
LVM to schedule "Return in about 6 months (around 06/24/2019), or lab appt and nurse visit same day."

## 2018-12-24 NOTE — Assessment & Plan Note (Signed)
Encouraged heart healthy diet, increase exercise, avoid trans fats, consider a krill oil cap daily 

## 2018-12-24 NOTE — Assessment & Plan Note (Signed)
Doing well on current meds refills given °

## 2018-12-24 NOTE — Assessment & Plan Note (Signed)
Supplement and monitor 

## 2018-12-24 NOTE — Assessment & Plan Note (Signed)
hgba1c acceptable, minimize simple carbs. Increase exercise as tolerated.  

## 2018-12-24 NOTE — Assessment & Plan Note (Signed)
Had a bad respiratory illness last fall but is doing better now. Given a prescription for Albuterol to use prn.

## 2018-12-24 NOTE — Assessment & Plan Note (Signed)
monitor

## 2018-12-24 NOTE — Assessment & Plan Note (Signed)
Encouraged to manage weekly. no changes to meds. Encouraged heart healthy diet such as the DASH diet and exercise as tolerated.

## 2018-12-24 NOTE — Progress Notes (Signed)
Virtual Visit via Video Note  I connected with Latoya [name] on 12/22/18 at  3:20 PM EDT by a video enabled telemedicine application and verified that I am speaking with the correct person using two identifiers.  Location: Patient: home Provider: office   I discussed the limitations of evaluation and management by telemedicine and the availability of in person appointments. The patient expressed understanding and agreed to proceed. Magdalene Molly, CMA was able to get the patient set up on a visit via video visit.     Subjective:    Patient ID: Latoya [name], female    DOB: [birth year], 63 y.o.   MRN: [id number]  No chief complaint on file.   HPI Patient is in today for follow up on chronic medical concerns including hypertension, hyperlipidemia, anxiety and more. She is doing well. No recent febrile illness or hospitalizations. Is staying active and trying to maintain quarantine. Denies CP/palp/SOB/HA/congestion/fevers/GI or GU c/o. Taking meds as prescribed  Past Medical History:  Diagnosis Date  . Anxiety   . Back pain   . Cancer ([place]) 12/2009   BCC, SCC  . Cervical cancer screening 12/30/2012  . Dermatitis 12/03/2007   Qualifier: Diagnosis of  By: Jerold Coombe    . Eczema 01/04/2013  . Environmental allergies   . Fatty liver   . Hyperlipidemia   . Hypertension   . Morbid obesity ([place]) 01/13/2012  . Multiple food allergies    cashew chicken, chinese, msg?  . Obesity 01/13/2012  . Overweight 01/13/2012  . Pain of toe of left foot 08/23/2014  . Preventative health care 01/04/2013  . Vitamin D deficiency     Past Surgical History:  Procedure Laterality Date  . BREAST BIOPSY Right   . dilation and currettage     lifestyle lift  . MOHS SURGERY  12/2009   basal cell     Family History  Problem Relation Age of Onset  . Heart disease Mother   . Diabetes Mother   . Hypertension Mother   . Hyperlipidemia Mother   . Heart Problems Mother   . Heart disease Father        pacer   . Diabetes Father   . Hypertension Father        weight controlled  . Obesity Father   . Hyperlipidemia Father   . Heart Problems Father   . Cancer Father   . Obesity Sister        s/p lap band  . Cancer Maternal Grandmother        pancreatic  . Cancer Paternal Grandmother 58       liver  . Psoriasis Brother   . Obesity Brother   . Obesity Brother   . Diabetes Brother        diet controlled  . Melanoma Other   . Colon cancer Neg Hx     Social History   Socioeconomic History  . Marital status: Single    Spouse name: Not on file  . Number of children: Not on file  . Years of education: Not on file  . Highest education level: Not on file  Occupational History  . Occupation: self Leisure centre manager: SELF-EMPLOYED  Social Needs  . Financial resource strain: Not on file  . Food insecurity    Worry: Not on file    Inability: Not on file  . Transportation needs    Medical: Not on file    Non-medical: Not on file  Tobacco Use  .  Smoking status: Former Research scientist (life sciences)  . Smokeless tobacco: Never Used  . Tobacco comment: quit 2008  Substance and Sexual Activity  . Alcohol use: Yes    Comment: weekly  . Drug use: No  . Sexual activity: Not Currently    Partners: Male    Comment: lives ALONE. no dietary restrictions, works full time Insurance underwriter  Lifestyle  . Physical activity    Days per week: Not on file    Minutes per session: Not on file  . Stress: Not on file  Relationships  . Social Herbalist on phone: Not on file    Gets together: Not on file    Attends religious service: Not on file    Active member of club or organization: Not on file    Attends meetings of clubs or organizations: Not on file    Relationship status: Not on file  . Intimate partner violence    Fear of current or ex partner: Not on file    Emotionally abused: Not on file    Physically abused: Not on file    Forced sexual activity: Not on file  Other Topics Concern  . Not  on file  Social History Narrative  . Not on file    Outpatient Medications Prior to Visit  Medication Sig Dispense Refill  . cetirizine (ZYRTEC) 10 MG tablet Take 1 tablet (10 mg total) by mouth 2 (two) times daily. (Patient taking differently: Take 10 mg by mouth daily after breakfast. ) 30 tablet 11  . clobetasol (TEMOVATE) 0.05 % external solution Apply 1 application topically 2 (two) times daily. (Patient taking differently: Apply 1 application topically 2 (two) times daily as needed (scalp). ) 50 mL 1  . fluticasone (FLONASE) 50 MCG/ACT nasal spray Place 2 sprays into both nostrils daily. 16 g 6  . Krill Oil 500 MG CAPS Take 1 capsule by mouth daily after breakfast.     . SYMBICORT 80-4.5 MCG/ACT inhaler INHALE 2 PUFFS INTO THE LUNGS 2 TIMES DAILY 10.2 g 2  . albuterol (VENTOLIN HFA) 108 (90 Base) MCG/ACT inhaler Inhale 2 puffs into the lungs every 6 (six) hours as needed for wheezing or shortness of breath. 1 Inhaler 3  . ALPRAZolam (XANAX) 0.25 MG tablet Take 1-2 tablets (0.25-0.5 mg total) by mouth at bedtime as needed for sleep or anxiety. 20 tablet 1  . atorvastatin (LIPITOR) 40 MG tablet Take 1 tablet (40 mg total) by mouth daily after breakfast. 90 tablet 1  . budesonide-formoterol (SYMBICORT) 80-4.5 MCG/ACT inhaler Inhale 2 puffs into the lungs 2 (two) times daily. 1 Inhaler 0  . citalopram (CELEXA) 20 MG tablet Take 1 tablet (20 mg total) by mouth daily. 15 tablet 0  . losartan (COZAAR) 100 MG tablet TAKE ONE TABLET BY MOUTH DAILY 90 tablet 0  . montelukast (SINGULAIR) 10 MG tablet Take 1 tablet (10 mg total) by mouth at bedtime. 30 tablet 0   No facility-administered medications prior to visit.     Allergies  Allergen Reactions  . Monosodium Glutamate Hives    Cashew Chicken    Review of Systems  Constitutional: Negative for fever and malaise/fatigue.  HENT: Negative for congestion.   Eyes: Negative for blurred vision.  Respiratory: Negative for shortness of breath.    Cardiovascular: Negative for chest pain, palpitations and leg swelling.  Gastrointestinal: Negative for abdominal pain, blood in stool and nausea.  Genitourinary: Negative for dysuria and frequency.  Musculoskeletal: Negative for falls.  Skin: Negative for  rash.  Neurological: Negative for dizziness, loss of consciousness and headaches.  Endo/Heme/Allergies: Negative for environmental allergies.  Psychiatric/Behavioral: Negative for depression. The patient is not nervous/anxious.        Objective:    Physical Exam Constitutional:      Appearance: Normal appearance. She is not ill-appearing.  HENT:     Head: Normocephalic and atraumatic.     Nose: Nose normal.  Eyes:     General:        Right eye: No discharge.        Left eye: No discharge.  Pulmonary:     Effort: Pulmonary effort is normal.  Neurological:     Mental Status: She is alert and oriented to person, place, and time.  Psychiatric:        Mood and Affect: Mood normal.        Behavior: Behavior normal.     LMP  (LMP Unknown) Comment: 2012 ? Wt Readings from Last 3 Encounters:  05/15/18 200 lb (90.7 kg)  04/15/18 199 lb 6.4 oz (90.4 kg)  02/25/18 194 lb (88 kg)    Diabetic Foot Exam - Simple   No data filed     Lab Results  Component Value Date   WBC 5.3 02/25/2018   HGB 13.3 02/25/2018   HCT 40.0 02/25/2018   PLT 260 02/25/2018   GLUCOSE 96 01/30/2018   CHOL 158 12/17/2017   TRIG 99.0 12/17/2017   HDL 76.60 12/17/2017   LDLDIRECT 144.9 12/23/2008   LDLCALC 61 12/17/2017   ALT 48 (H) 01/27/2018   AST 40 01/27/2018   NA 139 01/30/2018   K 3.4 (L) 01/30/2018   CL 101 01/30/2018   CREATININE 0.88 01/30/2018   BUN 18 01/30/2018   CO2 27 01/30/2018   TSH 1.040 07/04/2016   HGBA1C 5.4 07/04/2016    Lab Results  Component Value Date   TSH 1.040 07/04/2016   Lab Results  Component Value Date   WBC 5.3 02/25/2018   HGB 13.3 02/25/2018   HCT 40.0 02/25/2018   MCV 101 (H) 02/25/2018   PLT  260 02/25/2018   Lab Results  Component Value Date   NA 139 01/30/2018   K 3.4 (L) 01/30/2018   CO2 27 01/30/2018   GLUCOSE 96 01/30/2018   BUN 18 01/30/2018   CREATININE 0.88 01/30/2018   BILITOT 0.5 01/27/2018   ALKPHOS 58 01/27/2018   AST 40 01/27/2018   ALT 48 (H) 01/27/2018   PROT 7.1 01/27/2018   ALBUMIN 3.9 01/27/2018   CALCIUM 9.2 01/30/2018   ANIONGAP 9 01/27/2018   GFR 69.02 01/30/2018   Lab Results  Component Value Date   CHOL 158 12/17/2017   Lab Results  Component Value Date   HDL 76.60 12/17/2017   Lab Results  Component Value Date   LDLCALC 61 12/17/2017   Lab Results  Component Value Date   TRIG 99.0 12/17/2017   Lab Results  Component Value Date   CHOLHDL 2 12/17/2017   Lab Results  Component Value Date   HGBA1C 5.4 07/04/2016       Assessment & Plan:   Problem List Items Addressed This Visit    Hyperlipidemia, mixed    Encouraged heart healthy diet, increase exercise, avoid trans fats, consider a krill oil cap daily      Relevant Medications   atorvastatin (LIPITOR) 40 MG tablet   losartan (COZAAR) 100 MG tablet   Essential hypertension    Encouraged to manage weekly. no changes  to meds. Encouraged heart healthy diet such as the DASH diet and exercise as tolerated.       Relevant Medications   atorvastatin (LIPITOR) 40 MG tablet   losartan (COZAAR) 100 MG tablet   Anxiety    Doing well on current meds refills given      Relevant Medications   ALPRAZolam (XANAX) 0.25 MG tablet   citalopram (CELEXA) 20 MG tablet   Vitamin B12 deficiency    monitor      Cough   Relevant Medications   albuterol (VENTOLIN HFA) 108 (90 Base) MCG/ACT inhaler   Vitamin D deficiency    Supplement and monitor      Insulin resistance    hgba1c acceptable, minimize simple carbs. Increase exercise as tolerated.      Asthma    Had a bad respiratory illness last fall but is doing better now. Given a prescription for Albuterol to use prn.        Relevant Medications   montelukast (SINGULAIR) 10 MG tablet   albuterol (VENTOLIN HFA) 108 (90 Base) MCG/ACT inhaler    Other Visit Diagnoses    Anxiety and depression       Relevant Medications   ALPRAZolam (XANAX) 0.25 MG tablet   citalopram (CELEXA) 20 MG tablet   Hyperlipidemia, unspecified hyperlipidemia type       Relevant Medications   atorvastatin (LIPITOR) 40 MG tablet   losartan (COZAAR) 100 MG tablet      I have discontinued Blase Mess budesonide-formoterol. I have also changed her losartan. Additionally, I am having her maintain her Krill Oil, clobetasol, cetirizine, fluticasone, Symbicort, ALPRAZolam, citalopram, atorvastatin, montelukast, and albuterol.  Meds ordered this encounter  Medications  . ALPRAZolam (XANAX) 0.25 MG tablet    Sig: Take 1-2 tablets (0.25-0.5 mg total) by mouth at bedtime as needed for sleep or anxiety.    Dispense:  20 tablet    Refill:  2  . citalopram (CELEXA) 20 MG tablet    Sig: Take 1 tablet (20 mg total) by mouth daily.    Dispense:  90 tablet    Refill:  1  . atorvastatin (LIPITOR) 40 MG tablet    Sig: Take 1 tablet (40 mg total) by mouth daily after breakfast.    Dispense:  90 tablet    Refill:  0  . losartan (COZAAR) 100 MG tablet    Sig: Take 1 tablet (100 mg total) by mouth daily.    Dispense:  90 tablet    Refill:  0  . montelukast (SINGULAIR) 10 MG tablet    Sig: Take 1 tablet (10 mg total) by mouth at bedtime.    Dispense:  90 tablet    Refill:  0  . albuterol (VENTOLIN HFA) 108 (90 Base) MCG/ACT inhaler    Sig: Inhale 2 puffs into the lungs every 6 (six) hours as needed for wheezing or shortness of breath.    Dispense:  18 g    Refill:  1       I discussed the assessment and treatment plan with the patient. The patient was provided an opportunity to ask questions and all were answered. The patient agreed with the plan and demonstrated an understanding of the instructions.   The patient was advised to call  back or seek an in-person evaluation if the symptoms worsen or if the condition fails to improve as anticipated.  I provided 25 minutes of non-face-to-face time during this encounter.   Penni Homans, MD

## 2019-01-07 ENCOUNTER — Other Ambulatory Visit: Payer: Self-pay | Admitting: Family Medicine

## 2019-01-14 ENCOUNTER — Other Ambulatory Visit: Payer: Self-pay | Admitting: Family Medicine

## 2019-02-03 IMAGING — CT CT ANGIO CHEST
2 of 9 series · 18 of 36 positions shown · IV contrast (iopamidol)
Comparison: Chest radiograph 01/26/2018

CLINICAL DATA: Patient with decreased oxygen saturation. Cough.
Recent travel.

EXAM:
CT ANGIOGRAPHY CHEST WITH CONTRAST
TECHNIQUE: Multidetector CT imaging of the chest was performed using the
standard protocol during bolus administration of intravenous
contrast. Multiplanar CT image reconstructions and MIPs were
obtained to evaluate the vascular anatomy.
CONTRAST:  69mL E7UP8Q-5SS IOPAMIDOL (E7UP8Q-5SS) INJECTION 76%

[Series 7: pe thins · axial · 0.83mm/px · z∈[+827,+1111]mm · 17 of 318 slices shown]
[im 17/318  lung]
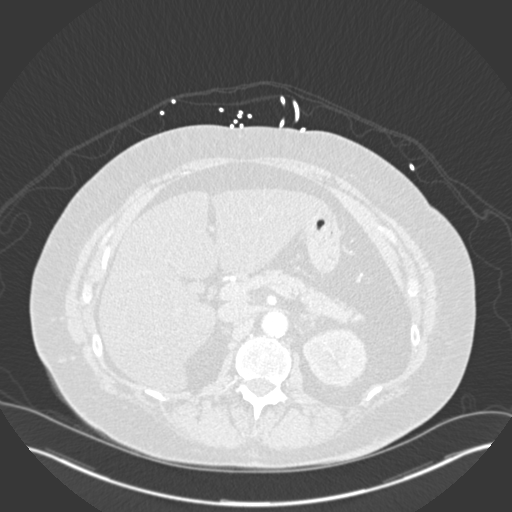
[im 34/318  mediastinal]
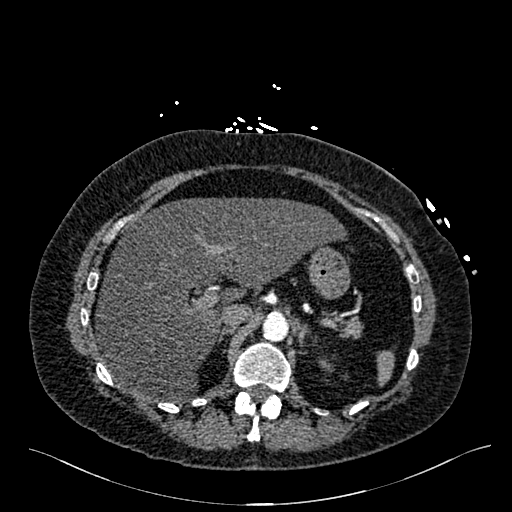
[im 51/318  lung]
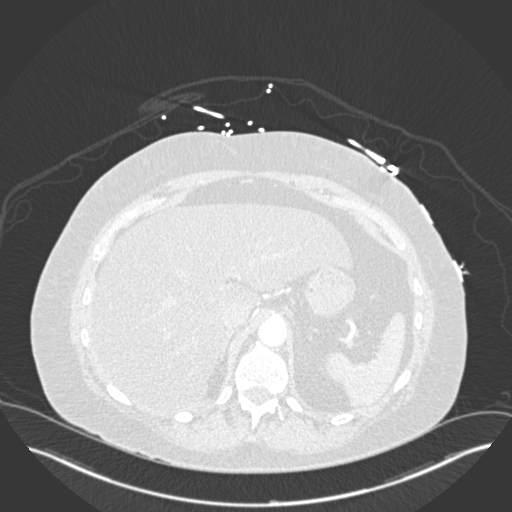
[im 67/318  mediastinal]
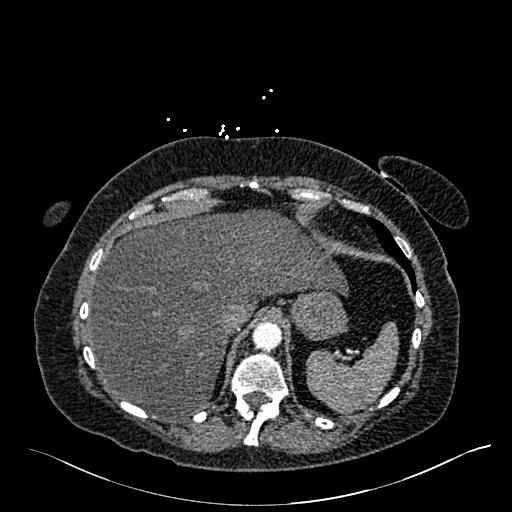
[im 84/318  lung]
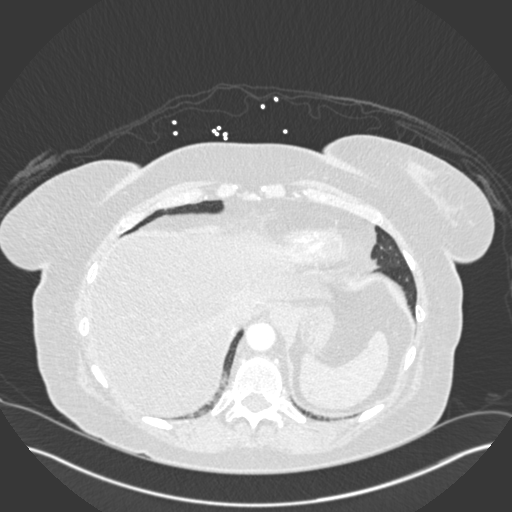
[im 101/318  mediastinal]
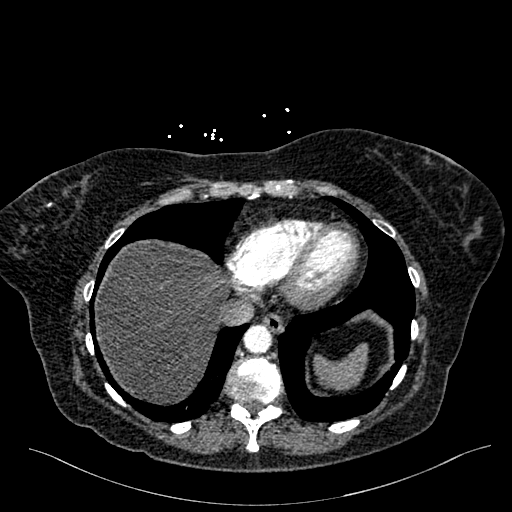
[im 117/318  lung]
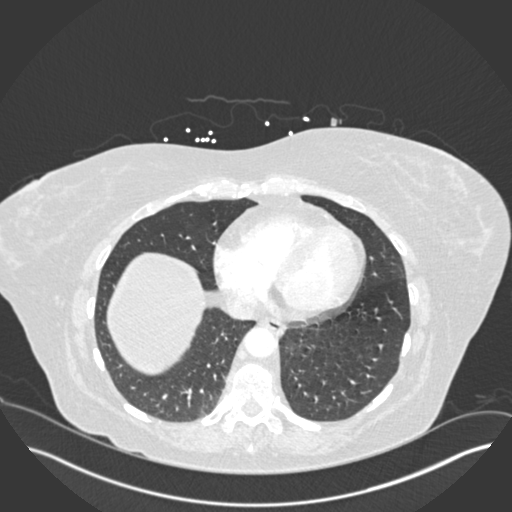
[im 134/318  mediastinal]
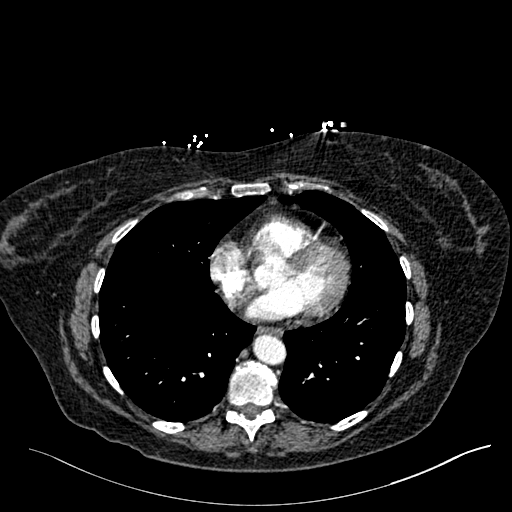
[im 167/318  lung]
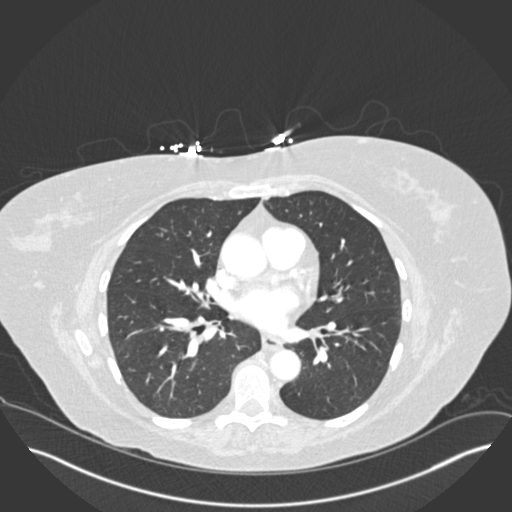
[im 184/318  mediastinal]
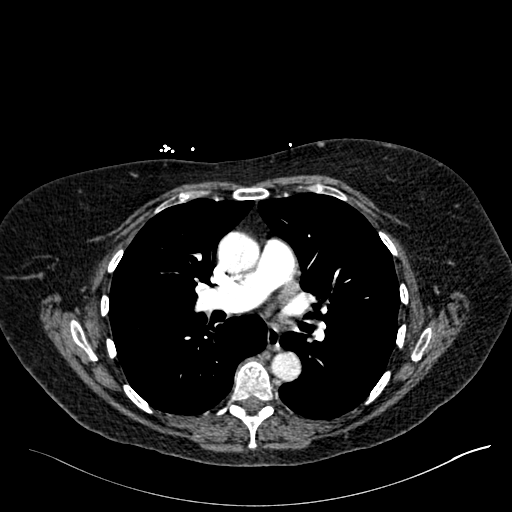
[im 201/318  lung]
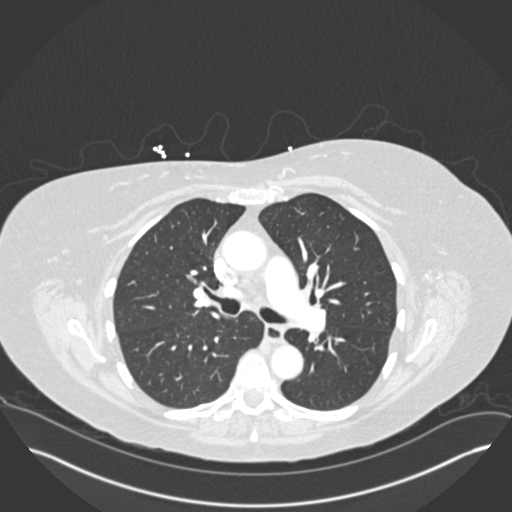
[im 217/318  mediastinal]
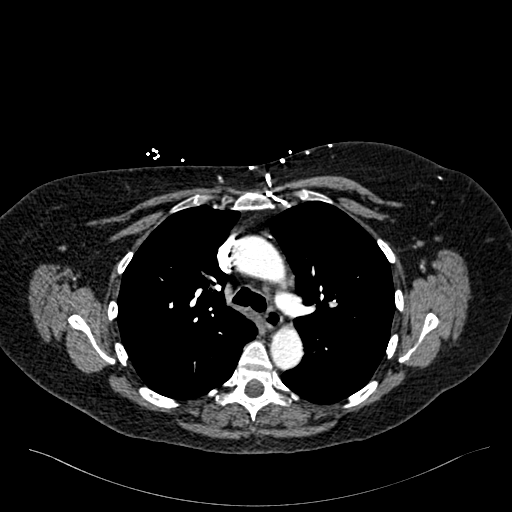
[im 234/318  lung]
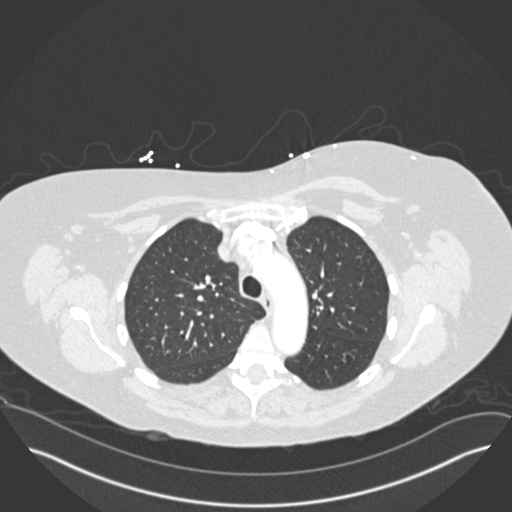
[im 251/318  mediastinal]
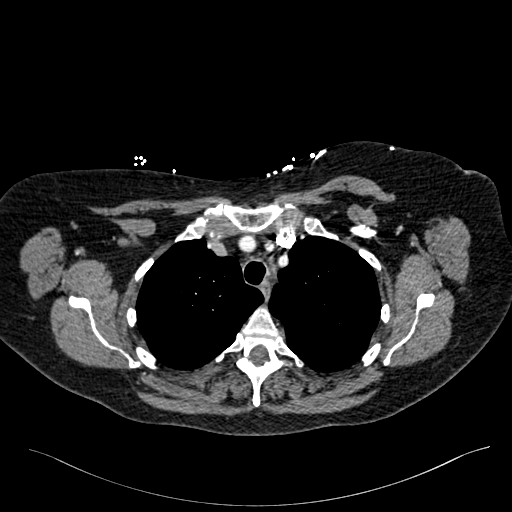
[im 267/318  lung]
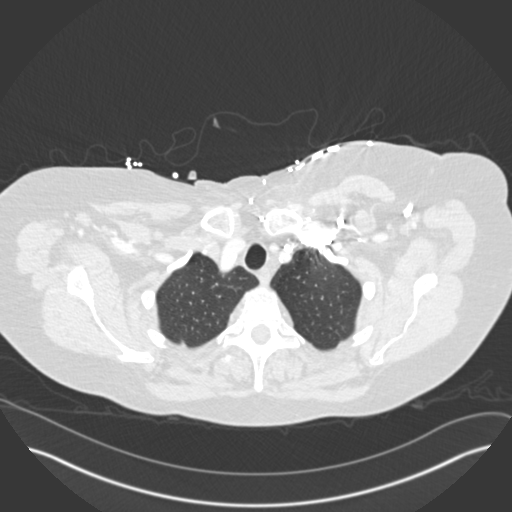
[im 284/318  mediastinal]
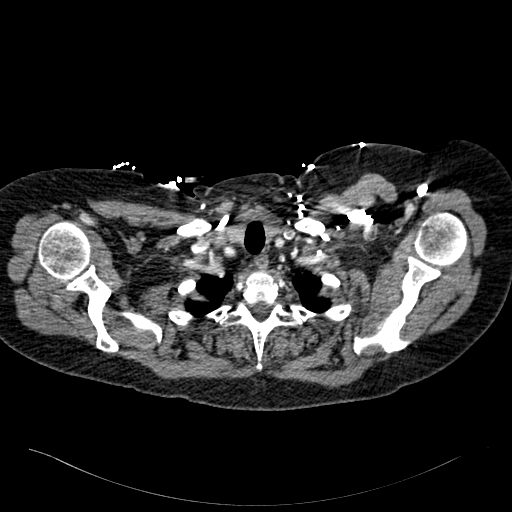
[im 301/318  lung]
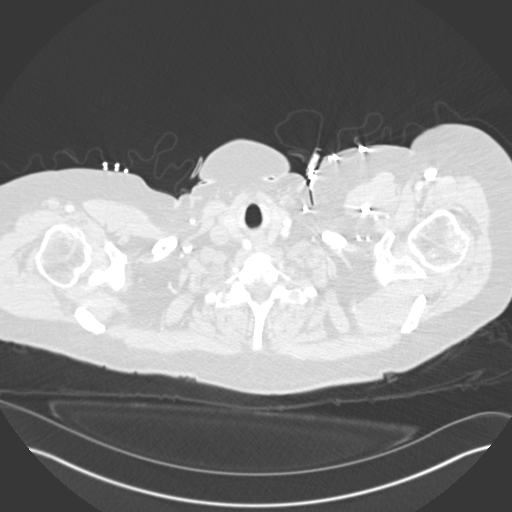

[Series 8: pe coronal mpr · coronal · 0.62mm/px · 1 of 141 slices shown]
[im 71/141  mediastinal]
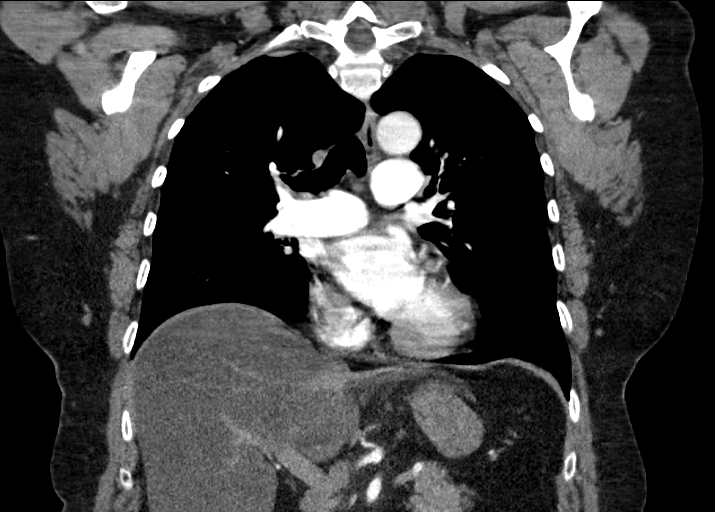

[18 of 36 positions shown; findings below may reference images not displayed]

FINDINGS: Cardiovascular: Normal heart size. No pericardial effusion. Aorta
and main pulmonary artery are normal in caliber. Thoracic aortic
vascular calcifications. Adequate opacification of the pulmonary
artery. No filling defect identified to suggest acute pulmonary
embolus.

Mediastinum/Nodes: No enlarged axillary, mediastinal or hilar
lymphadenopathy.

Lungs/Pleura: Central airways are patent. Dependent atelectasis
right lower lobe. No large area of pulmonary consolidation. No
pleural effusion or pneumothorax.

Upper Abdomen: Liver is diffusely low in attenuation compatible with
steatosis. Increased density material within the gallbladder.
Probable small cyst within the spleen.

Musculoskeletal: Thoracic spine degenerative changes. No aggressive
or acute appearing osseous lesions.

Review of the MIP images confirms the above findings.
IMPRESSION: 1. No evidence for acute pulmonary embolus.
2. Hepatic steatosis.
3. Aortic Atherosclerosis ([id number]).
4. Increased density within the gallbladder lumen may represent
stones/sludge.

## 2019-02-05 ENCOUNTER — Other Ambulatory Visit: Payer: Self-pay | Admitting: Family Medicine

## 2019-02-05 ENCOUNTER — Other Ambulatory Visit (INDEPENDENT_AMBULATORY_CARE_PROVIDER_SITE_OTHER): Payer: PRIVATE HEALTH INSURANCE

## 2019-02-05 ENCOUNTER — Ambulatory Visit (INDEPENDENT_AMBULATORY_CARE_PROVIDER_SITE_OTHER): Payer: PRIVATE HEALTH INSURANCE

## 2019-02-05 ENCOUNTER — Other Ambulatory Visit: Payer: Self-pay

## 2019-02-05 DIAGNOSIS — E559 Vitamin D deficiency, unspecified: Secondary | ICD-10-CM

## 2019-02-05 DIAGNOSIS — E8881 Metabolic syndrome: Secondary | ICD-10-CM

## 2019-02-05 DIAGNOSIS — E538 Deficiency of other specified B group vitamins: Secondary | ICD-10-CM

## 2019-02-05 DIAGNOSIS — E782 Mixed hyperlipidemia: Secondary | ICD-10-CM | POA: Diagnosis not present

## 2019-02-05 DIAGNOSIS — Z23 Encounter for immunization: Secondary | ICD-10-CM

## 2019-02-05 DIAGNOSIS — I10 Essential (primary) hypertension: Secondary | ICD-10-CM

## 2019-02-05 LAB — CBC
HCT: 39.2 % (ref 36.0–46.0)
Hemoglobin: 12.9 g/dL (ref 12.0–15.0)
MCHC: 32.9 g/dL (ref 30.0–36.0)
MCV: 102.8 fl — ABNORMAL HIGH (ref 78.0–100.0)
Platelets: 215 10*3/uL (ref 150.0–400.0)
RBC: 3.82 Mil/uL — ABNORMAL LOW (ref 3.87–5.11)
RDW: 15.5 % (ref 11.5–15.5)
WBC: 5.7 10*3/uL (ref 4.0–10.5)

## 2019-02-05 LAB — COMPREHENSIVE METABOLIC PANEL
ALT: 70 U/L — ABNORMAL HIGH (ref 0–35)
AST: 60 U/L — ABNORMAL HIGH (ref 0–37)
Albumin: 4.4 g/dL (ref 3.5–5.2)
Alkaline Phosphatase: 60 U/L (ref 39–117)
BUN: 14 mg/dL (ref 6–23)
CO2: 30 mEq/L (ref 19–32)
Calcium: 9.7 mg/dL (ref 8.4–10.5)
Chloride: 102 mEq/L (ref 96–112)
Creatinine, Ser: 0.97 mg/dL (ref 0.40–1.20)
GFR: 57.84 mL/min — ABNORMAL LOW (ref >60.00)
Glucose, Bld: 90 mg/dL (ref 70–99)
Potassium: 4.6 mEq/L (ref 3.5–5.1)
Sodium: 140 mEq/L (ref 135–145)
Total Bilirubin: 0.6 mg/dL (ref 0.2–1.2)
Total Protein: 7.2 g/dL (ref 6.0–8.3)

## 2019-02-05 LAB — LIPID PANEL
Cholesterol: 159 mg/dL (ref 0–200)
HDL: 70 mg/dL (ref >39.00)
LDL Cholesterol: 76 mg/dL (ref 0–99)
NonHDL: 89.41
Total CHOL/HDL Ratio: 2
Triglycerides: 69 mg/dL (ref 0.0–149.0)
VLDL: 13.8 mg/dL (ref 0.0–40.0)

## 2019-02-05 LAB — VITAMIN B12: Vitamin B-12: 333 pg/mL (ref 211–911)

## 2019-02-05 LAB — HEMOGLOBIN A1C: Hgb A1c MFr Bld: 5.9 % (ref 4.6–6.5)

## 2019-02-05 LAB — VITAMIN D 25 HYDROXY (VIT D DEFICIENCY, FRACTURES): VITD: 22.56 ng/mL — ABNORMAL LOW (ref 30.00–100.00)

## 2019-02-05 LAB — TSH: TSH: 1.8 u[IU]/mL (ref 0.35–4.50)

## 2019-02-05 NOTE — Progress Notes (Signed)
Pt here today for flu vaccine.   Fluarix 0.34mL injected into L deltoid. Pt tolerated injection well.

## 2019-02-10 ENCOUNTER — Other Ambulatory Visit: Payer: PRIVATE HEALTH INSURANCE

## 2019-02-10 ENCOUNTER — Other Ambulatory Visit: Payer: Self-pay | Admitting: *Deleted

## 2019-02-10 DIAGNOSIS — E559 Vitamin D deficiency, unspecified: Secondary | ICD-10-CM

## 2019-02-10 DIAGNOSIS — R748 Abnormal levels of other serum enzymes: Secondary | ICD-10-CM

## 2019-02-10 MED ORDER — VITAMIN D (ERGOCALCIFEROL) 1.25 MG (50000 UNIT) PO CAPS: 50000.0000 [IU] | ORAL_CAPSULE | ORAL | 0 refills | Status: DC

## 2019-02-11 LAB — HEPATITIS PANEL, ACUTE
Hep A IgM: NONREACTIVE
Hep B C IgM: NONREACTIVE
Hepatitis B Surface Ag: NONREACTIVE
Hepatitis C Ab: NONREACTIVE
SIGNAL TO CUT-OFF: 0.04 (ref <1.00)

## 2019-02-13 ENCOUNTER — Other Ambulatory Visit: Payer: Self-pay

## 2019-02-13 ENCOUNTER — Ambulatory Visit (HOSPITAL_BASED_OUTPATIENT_CLINIC_OR_DEPARTMENT_OTHER)
Admission: RE | Admit: 2019-02-13 | Discharge: 2019-02-13 | Disposition: A | Discharge status: Home / Self Care | Payer: No Typology Code available for payment source | Source: Ambulatory Visit | Attending: Family Medicine | Admitting: Family Medicine

## 2019-02-13 DIAGNOSIS — R748 Abnormal levels of other serum enzymes: Secondary | ICD-10-CM | POA: Insufficient documentation

## 2019-03-19 ENCOUNTER — Other Ambulatory Visit: Payer: Self-pay | Admitting: Family Medicine

## 2019-03-19 DIAGNOSIS — I10 Essential (primary) hypertension: Secondary | ICD-10-CM

## 2019-03-23 NOTE — Telephone Encounter (Signed)
Received below message from patient via [place].  Mailing patient symbicort discount card.  Nothing further needed at this time.   "Hi Latoya [name], My discount card for Symbicort has expired and I had to pay over $300 for my last refill (with the card, it's a little over $100). Please send me another card or I can come by your office to pick it up. I need some help and can not continue to  pay $300 for this prescription. Thank you, Latoya [name] [address] [place], [place] [address]"

## 2019-03-27 ENCOUNTER — Other Ambulatory Visit: Payer: Self-pay | Admitting: Family Medicine

## 2019-03-27 DIAGNOSIS — E785 Hyperlipidemia, unspecified: Secondary | ICD-10-CM

## 2019-05-03 ENCOUNTER — Other Ambulatory Visit: Payer: Self-pay | Admitting: Family Medicine

## 2019-05-10 ENCOUNTER — Other Ambulatory Visit: Payer: Self-pay | Admitting: Adult Health

## 2019-05-12 ENCOUNTER — Other Ambulatory Visit (INDEPENDENT_AMBULATORY_CARE_PROVIDER_SITE_OTHER): Payer: PRIVATE HEALTH INSURANCE

## 2019-05-12 ENCOUNTER — Other Ambulatory Visit: Payer: Self-pay

## 2019-05-12 DIAGNOSIS — R748 Abnormal levels of other serum enzymes: Secondary | ICD-10-CM

## 2019-05-12 DIAGNOSIS — E559 Vitamin D deficiency, unspecified: Secondary | ICD-10-CM

## 2019-05-12 LAB — COMPREHENSIVE METABOLIC PANEL
ALT: 50 U/L — ABNORMAL HIGH (ref 0–35)
AST: 42 U/L — ABNORMAL HIGH (ref 0–37)
Albumin: 4.5 g/dL (ref 3.5–5.2)
Alkaline Phosphatase: 60 U/L (ref 39–117)
BUN: 16 mg/dL (ref 6–23)
CO2: 26 mEq/L (ref 19–32)
Calcium: 9.7 mg/dL (ref 8.4–10.5)
Chloride: 103 mEq/L (ref 96–112)
Creatinine, Ser: 1.12 mg/dL (ref 0.40–1.20)
GFR: 48.96 mL/min — ABNORMAL LOW (ref >60.00)
Glucose, Bld: 80 mg/dL (ref 70–99)
Potassium: 4.5 mEq/L (ref 3.5–5.1)
Sodium: 140 mEq/L (ref 135–145)
Total Bilirubin: 0.5 mg/dL (ref 0.2–1.2)
Total Protein: 7.2 g/dL (ref 6.0–8.3)

## 2019-05-12 LAB — VITAMIN D 25 HYDROXY (VIT D DEFICIENCY, FRACTURES): VITD: 44.81 ng/mL (ref 30.00–100.00)

## 2019-06-02 ENCOUNTER — Encounter: Payer: Self-pay | Admitting: Family Medicine

## 2019-06-02 DIAGNOSIS — R05 Cough: Secondary | ICD-10-CM

## 2019-06-02 DIAGNOSIS — R059 Cough, unspecified: Secondary | ICD-10-CM

## 2019-06-05 MED ORDER — ALBUTEROL SULFATE HFA 108 (90 BASE) MCG/ACT IN AERS: 2.0000 | INHALATION_SPRAY | Freq: Four times a day (QID) | RESPIRATORY_TRACT | 1 refills | Status: DC | PRN

## 2019-06-25 ENCOUNTER — Other Ambulatory Visit: Payer: Self-pay | Admitting: Family Medicine

## 2019-06-25 ENCOUNTER — Ambulatory Visit (INDEPENDENT_AMBULATORY_CARE_PROVIDER_SITE_OTHER): Payer: PRIVATE HEALTH INSURANCE | Admitting: Family Medicine

## 2019-06-25 ENCOUNTER — Other Ambulatory Visit: Payer: Self-pay

## 2019-06-25 ENCOUNTER — Telehealth: Payer: Self-pay | Admitting: Family Medicine

## 2019-06-25 DIAGNOSIS — L309 Dermatitis, unspecified: Secondary | ICD-10-CM

## 2019-06-25 DIAGNOSIS — E559 Vitamin D deficiency, unspecified: Secondary | ICD-10-CM

## 2019-06-25 DIAGNOSIS — J4530 Mild persistent asthma, uncomplicated: Secondary | ICD-10-CM

## 2019-06-25 DIAGNOSIS — F32A Depression, unspecified: Secondary | ICD-10-CM

## 2019-06-25 DIAGNOSIS — F419 Anxiety disorder, unspecified: Secondary | ICD-10-CM

## 2019-06-25 DIAGNOSIS — E785 Hyperlipidemia, unspecified: Secondary | ICD-10-CM

## 2019-06-25 DIAGNOSIS — E538 Deficiency of other specified B group vitamins: Secondary | ICD-10-CM

## 2019-06-25 DIAGNOSIS — I10 Essential (primary) hypertension: Secondary | ICD-10-CM | POA: Diagnosis not present

## 2019-06-25 DIAGNOSIS — F329 Major depressive disorder, single episode, unspecified: Secondary | ICD-10-CM

## 2019-06-25 MED ORDER — FLUTICASONE PROPIONATE 50 MCG/ACT NA SUSP: NASAL | 1 refills | Status: DC

## 2019-06-25 MED ORDER — MONTELUKAST SODIUM 10 MG PO TABS: 10.0000 mg | ORAL_TABLET | Freq: Every day | ORAL | 1 refills | Status: DC

## 2019-06-25 MED ORDER — CITALOPRAM HYDROBROMIDE 20 MG PO TABS: 20.0000 mg | ORAL_TABLET | Freq: Every day | ORAL | 1 refills | Status: DC

## 2019-06-25 MED ORDER — ATORVASTATIN CALCIUM 40 MG PO TABS: ORAL_TABLET | ORAL | 1 refills | Status: DC

## 2019-06-25 MED ORDER — LOSARTAN POTASSIUM 100 MG PO TABS: 100.0000 mg | ORAL_TABLET | Freq: Every day | ORAL | 1 refills | Status: DC

## 2019-06-25 MED ORDER — VITAMIN D (ERGOCALCIFEROL) 1.25 MG (50000 UNIT) PO CAPS: 50000.0000 [IU] | ORAL_CAPSULE | ORAL | 2 refills | Status: DC

## 2019-06-25 MED ORDER — FLUTICASONE-SALMETEROL 250-50 MCG/DOSE IN AEPB: 1.0000 | INHALATION_SPRAY | Freq: Two times a day (BID) | RESPIRATORY_TRACT | 3 refills | Status: DC

## 2019-06-25 MED ORDER — CETIRIZINE HCL 10 MG PO TABS: 10.0000 mg | ORAL_TABLET | Freq: Two times a day (BID) | ORAL | 1 refills | Status: DC

## 2019-06-25 NOTE — Assessment & Plan Note (Signed)
no changes to meds. Encouraged heart healthy diet such as the DASH diet and exercise as tolerated.  

## 2019-06-25 NOTE — Progress Notes (Signed)
Virtual Visit via phone Note  I connected with Latoya [name] on 06/25/19 at 11:00 AM EST by a phone enabled telemedicine application and verified that I am speaking with the correct person using two identifiers.  Location: Patient: home Provider: office   I discussed the limitations of evaluation and management by telemedicine and the availability of in person appointments. The patient expressed understanding and agreed to proceed. Magdalene Molly, CMA was able to get the patient set up on a visit, phone after being unable to set up a video visit   Subjective:    Patient ID: Latoya [name], female    DOB: 1956-01-13, 64 y.o.   MRN: [id number]  No chief complaint on file.   HPI Patient is in today for follow up on chronic medical concerns. No recent febrile illness or hospitalizations. She has been unable to afford her Symbicort so she stopped it about a month ago. She has been using more albuterol and still wheezing and more congested. She has a long history of bad asthma and allergies and flares from time to time. She does not believe she hs any infectious symptoms. No fevers or chills. Denies CP/palp/HA/fevers/GI or GU c/o. Taking meds as prescribed  Past Medical History:  Diagnosis Date  . Anxiety   . Back pain   . Cancer ([place]) 12/2009   BCC, SCC  . Cervical cancer screening 12/30/2012  . Dermatitis 12/03/2007   Qualifier: Diagnosis of  By: Jerold Coombe    . Eczema 01/04/2013  . Environmental allergies   . Fatty liver   . Hyperlipidemia   . Hypertension   . Morbid obesity ([place]) 01/13/2012  . Multiple food allergies    cashew chicken, chinese, msg?  . Obesity 01/13/2012  . Overweight 01/13/2012  . Pain of toe of left foot 08/23/2014  . Preventative health care 01/04/2013  . Vitamin D deficiency     Past Surgical History:  Procedure Laterality Date  . BREAST BIOPSY Right   . dilation and currettage     lifestyle lift  . MOHS SURGERY  12/2009   basal cell     Family History    Problem Relation Age of Onset  . Heart disease Mother   . Diabetes Mother   . Hypertension Mother   . Hyperlipidemia Mother   . Heart Problems Mother   . Heart disease Father        pacer  . Diabetes Father   . Hypertension Father        weight controlled  . Obesity Father   . Hyperlipidemia Father   . Heart Problems Father   . Cancer Father   . Obesity Sister        s/p lap band  . Cancer Maternal Grandmother        pancreatic  . Cancer Paternal Grandmother 53       liver  . Psoriasis Brother   . Obesity Brother   . Obesity Brother   . Diabetes Brother        diet controlled  . Melanoma Other   . Colon cancer Neg Hx     Social History   Socioeconomic History  . Marital status: Single    Spouse name: Not on file  . Number of children: Not on file  . Years of education: Not on file  . Highest education level: Not on file  Occupational History  . Occupation: self Leisure centre manager: SELF-EMPLOYED  Tobacco Use  . Smoking status:  Former Smoker  . Smokeless tobacco: Never Used  . Tobacco comment: quit 2008  Substance and Sexual Activity  . Alcohol use: Yes    Comment: weekly  . Drug use: No  . Sexual activity: Not Currently    Partners: Male    Comment: lives ALONE. no dietary restrictions, works full time Insurance underwriter  Other Topics Concern  . Not on file  Social History Narrative  . Not on file   Social Determinants of Health   Financial Resource Strain:   . Difficulty of Paying Living Expenses: Not on file  Food Insecurity:   . Worried About Charity fundraiser in the Last Year: Not on file  . Ran Out of Food in the Last Year: Not on file  Transportation Needs:   . Lack of Transportation (Medical): Not on file  . Lack of Transportation (Non-Medical): Not on file  Physical Activity:   . Days of Exercise per Week: Not on file  . Minutes of Exercise per Session: Not on file  Stress:   . Feeling of Stress : Not on file  Social Connections:    . Frequency of Communication with Friends and Family: Not on file  . Frequency of Social Gatherings with Friends and Family: Not on file  . Attends Religious Services: Not on file  . Active Member of Clubs or Organizations: Not on file  . Attends Archivist Meetings: Not on file  . Marital Status: Not on file  Intimate Partner Violence:   . Fear of Current or Ex-Partner: Not on file  . Emotionally Abused: Not on file  . Physically Abused: Not on file  . Sexually Abused: Not on file    Outpatient Medications Prior to Visit  Medication Sig Dispense Refill  . albuterol (VENTOLIN HFA) 108 (90 Base) MCG/ACT inhaler Inhale 2 puffs into the lungs every 6 (six) hours as needed for wheezing or shortness of breath. 18 g 1  . ALPRAZolam (XANAX) 0.25 MG tablet Take 1-2 tablets (0.25-0.5 mg total) by mouth at bedtime as needed for sleep or anxiety. 20 tablet 2  . clobetasol (TEMOVATE) 0.05 % external solution Apply 1 application topically 2 (two) times daily. (Patient taking differently: Apply 1 application topically 2 (two) times daily as needed (scalp). ) 50 mL 1  . Krill Oil 500 MG CAPS Take 1 capsule by mouth daily after breakfast.     . atorvastatin (LIPITOR) 40 MG tablet TAKE 1 TABLET BY MOUTH DAILY AFTER BREAKFAST 90 tablet 0  . cetirizine (ZYRTEC) 10 MG tablet Take 1 tablet (10 mg total) by mouth 2 (two) times daily. (Patient taking differently: Take 10 mg by mouth daily after breakfast. ) 30 tablet 11  . citalopram (CELEXA) 20 MG tablet Take 1 tablet (20 mg total) by mouth daily. 90 tablet 1  . fluticasone (FLONASE) 50 MCG/ACT nasal spray USE 2 SPRAYS INTO BOTH NOSTRILS DAILY 15 g 5  . losartan (COZAAR) 100 MG tablet TAKE ONE TABLET BY MOUTH DAILY 90 tablet 0  . montelukast (SINGULAIR) 10 MG tablet TAKE ONE TABLET BY MOUTH EVERY NIGHT AT BEDTIME 30 tablet 0  . montelukast (SINGULAIR) 10 MG tablet TAKE ONE TABLET BY MOUTH AT BEDTIME 30 tablet 0  . SYMBICORT 80-4.5 MCG/ACT inhaler  INHALE TWO PUFFS BY MOUTH TWICE A DAY 10.2 g 1  . Vitamin D, Ergocalciferol, (DRISDOL) 1.25 MG (50000 UT) CAPS capsule TAKE 1 CAPSULE BY MOUTH EVERY 7 DAYS 12 capsule 0   No facility-administered medications prior  to visit.    Allergies  Allergen Reactions  . Monosodium Glutamate Hives    Cashew Chicken    Review of Systems  Constitutional: Positive for malaise/fatigue. Negative for fever.  HENT: Positive for congestion.   Eyes: Negative for blurred vision.  Respiratory: Positive for cough, sputum production and wheezing. Negative for shortness of breath.   Cardiovascular: Negative for chest pain, palpitations and leg swelling.  Gastrointestinal: Negative for abdominal pain, blood in stool and nausea.  Genitourinary: Negative for dysuria and frequency.  Musculoskeletal: Negative for falls.  Skin: Positive for itching. Negative for rash.  Neurological: Negative for dizziness, loss of consciousness and headaches.  Endo/Heme/Allergies: Negative for environmental allergies.  Psychiatric/Behavioral: Negative for depression. The patient is not nervous/anxious.        Objective:    Physical Exam  LMP  (LMP Unknown) Comment: 2012 ? Wt Readings from Last 3 Encounters:  05/15/18 200 lb (90.7 kg)  04/15/18 199 lb 6.4 oz (90.4 kg)  02/25/18 194 lb (88 kg)    Diabetic Foot Exam - Simple   No data filed     Lab Results  Component Value Date   WBC 5.7 02/05/2019   HGB 12.9 02/05/2019   HCT 39.2 02/05/2019   PLT 215.0 02/05/2019   GLUCOSE 80 05/12/2019   CHOL 159 02/05/2019   TRIG 69.0 02/05/2019   HDL 70.00 02/05/2019   LDLDIRECT 144.9 12/23/2008   LDLCALC 76 02/05/2019   ALT 50 (H) 05/12/2019   AST 42 (H) 05/12/2019   NA 140 05/12/2019   K 4.5 05/12/2019   CL 103 05/12/2019   CREATININE 1.12 05/12/2019   BUN 16 05/12/2019   CO2 26 05/12/2019   TSH 1.80 02/05/2019   HGBA1C 5.9 02/05/2019    Lab Results  Component Value Date   TSH 1.80 02/05/2019   Lab Results    Component Value Date   WBC 5.7 02/05/2019   HGB 12.9 02/05/2019   HCT 39.2 02/05/2019   MCV 102.8 (H) 02/05/2019   PLT 215.0 02/05/2019   Lab Results  Component Value Date   NA 140 05/12/2019   K 4.5 05/12/2019   CO2 26 05/12/2019   GLUCOSE 80 05/12/2019   BUN 16 05/12/2019   CREATININE 1.12 05/12/2019   BILITOT 0.5 05/12/2019   ALKPHOS 60 05/12/2019   AST 42 (H) 05/12/2019   ALT 50 (H) 05/12/2019   PROT 7.2 05/12/2019   ALBUMIN 4.5 05/12/2019   CALCIUM 9.7 05/12/2019   ANIONGAP 9 01/27/2018   GFR 48.96 (L) 05/12/2019   Lab Results  Component Value Date   CHOL 159 02/05/2019   Lab Results  Component Value Date   HDL 70.00 02/05/2019   Lab Results  Component Value Date   LDLCALC 76 02/05/2019   Lab Results  Component Value Date   TRIG 69.0 02/05/2019   Lab Results  Component Value Date   CHOLHDL 2 02/05/2019   Lab Results  Component Value Date   HGBA1C 5.9 02/05/2019       Assessment & Plan:   Problem List Items Addressed This Visit    Essential hypertension     no changes to meds. Encouraged heart healthy diet such as the DASH diet and exercise as tolerated.       Relevant Medications   atorvastatin (LIPITOR) 40 MG tablet   losartan (COZAAR) 100 MG tablet   Eczema    She is struggling with an increased itch most notably under her bra posteriorly, sometimes with some  urticaria after scratching develops. Is encouraged to increase Zyrtec to bid and continue famotidine, continue Singulair, use Witch Hazel Astringent and sarna lotion, benadryl cream. And report if no improvement      Vitamin B12 deficiency    Supplement and monitor      Vitamin D deficiency    Supplement and monitor      Asthma    She has no prescription coverage this year and her symbicort is cost prohibitive. She is able to call around and finds a decent cash pay price on Advair 250/50 sig 1 puff po bid is called into her pharmacy      Relevant Medications   montelukast  (SINGULAIR) 10 MG tablet    Other Visit Diagnoses    Hyperlipidemia, unspecified hyperlipidemia type       Relevant Medications   atorvastatin (LIPITOR) 40 MG tablet   losartan (COZAAR) 100 MG tablet   Anxiety and depression       Relevant Medications   citalopram (CELEXA) 20 MG tablet      I have discontinued Ailah Brubacher's montelukast. I have also changed her losartan, Vitamin D (Ergocalciferol), and montelukast. Additionally, I am having her maintain her Krill Oil, clobetasol, ALPRAZolam, albuterol, atorvastatin, fluticasone, citalopram, and cetirizine.  Meds ordered this encounter  Medications  . atorvastatin (LIPITOR) 40 MG tablet    Sig: TAKE 1 TABLET BY MOUTH DAILY AFTER BREAKFAST    Dispense:  90 tablet    Refill:  1  . fluticasone (FLONASE) 50 MCG/ACT nasal spray    Sig: USE 2 SPRAYS INTO BOTH NOSTRILS DAILY    Dispense:  48 g    Refill:  1  . losartan (COZAAR) 100 MG tablet    Sig: Take 1 tablet (100 mg total) by mouth daily.    Dispense:  90 tablet    Refill:  1  . citalopram (CELEXA) 20 MG tablet    Sig: Take 1 tablet (20 mg total) by mouth daily.    Dispense:  90 tablet    Refill:  1  . cetirizine (ZYRTEC) 10 MG tablet    Sig: Take 1 tablet (10 mg total) by mouth 2 (two) times daily.    Dispense:  180 tablet    Refill:  1  . Vitamin D, Ergocalciferol, (DRISDOL) 1.25 MG (50000 UNIT) CAPS capsule    Sig: Take 1 capsule (50,000 Units total) by mouth every 7 (seven) days.    Dispense:  12 capsule    Refill:  2  . montelukast (SINGULAIR) 10 MG tablet    Sig: Take 1 tablet (10 mg total) by mouth at bedtime.    Dispense:  90 tablet    Refill:  1     I discussed the assessment and treatment plan with the patient. The patient was provided an opportunity to ask questions and all were answered. The patient agreed with the plan and demonstrated an understanding of the instructions.   The patient was advised to call back or seek an in-person evaluation if the symptoms  worsen or if the condition fails to improve as anticipated.  I provided 20 minutes of non-face-to-face time during this encounter.   Penni Homans, MD

## 2019-06-25 NOTE — Assessment & Plan Note (Signed)
She has no prescription coverage this year and her symbicort is cost prohibitive. She is able to call around and finds a decent cash pay price on Advair 250/50 sig 1 puff po bid is called into her pharmacy

## 2019-06-25 NOTE — Telephone Encounter (Signed)
Caller Name: self Phone: [phone] Pharmacy: [facility], [address] Phone:  [phone]  Fax:  [phone]     Pt states she was to notify Dr. Charlett Blake of the steroid inhaler that would be most cost effective. Pt states the Advair Diskus 265mcg/50mcg is the best for the budget.

## 2019-06-25 NOTE — Assessment & Plan Note (Addendum)
She is struggling with an increased itch most notably under her bra posteriorly, sometimes with some urticaria after scratching develops. Is encouraged to increase Zyrtec to bid and continue famotidine, continue Singulair, use Witch Hazel Astringent and sarna lotion, benadryl cream. And report if no improvement

## 2019-06-25 NOTE — Telephone Encounter (Signed)
Please advise on med dosage

## 2019-06-25 NOTE — Assessment & Plan Note (Signed)
Supplement and monitor 

## 2019-06-25 NOTE — Telephone Encounter (Signed)
I sent Advair to her [facility]

## 2019-06-26 NOTE — Telephone Encounter (Signed)
Called patient and let her know.  

## 2019-12-19 ENCOUNTER — Other Ambulatory Visit: Payer: Self-pay | Admitting: Family Medicine

## 2019-12-19 DIAGNOSIS — E785 Hyperlipidemia, unspecified: Secondary | ICD-10-CM

## 2019-12-19 DIAGNOSIS — F329 Major depressive disorder, single episode, unspecified: Secondary | ICD-10-CM

## 2019-12-19 DIAGNOSIS — F419 Anxiety disorder, unspecified: Secondary | ICD-10-CM

## 2020-01-23 ENCOUNTER — Other Ambulatory Visit: Payer: Self-pay | Admitting: Family Medicine

## 2020-02-21 IMAGING — US US ABDOMEN COMPLETE
1 series · 14 of 25 positions shown · non-contrast
Comparison: None.

CLINICAL DATA: Elevated LFTs

EXAM:
ABDOMEN ULTRASOUND COMPLETE

[Series 1: us abdomen complete · 14 of 107 slices shown]
[im 1/107]
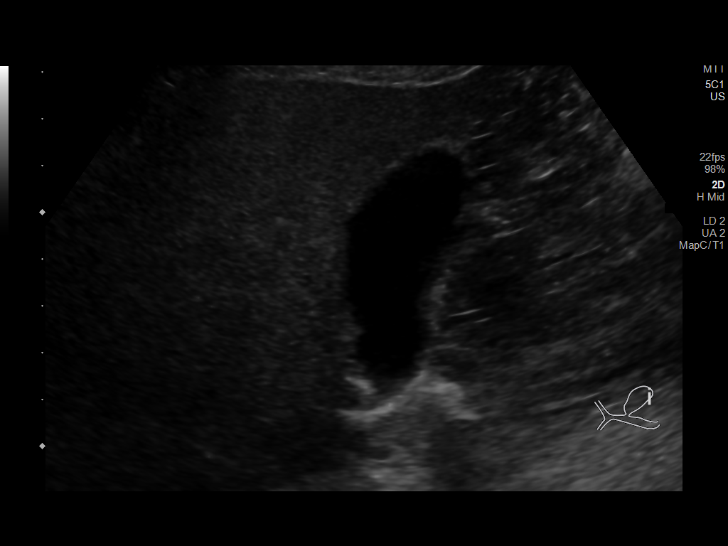
[im 9/107]
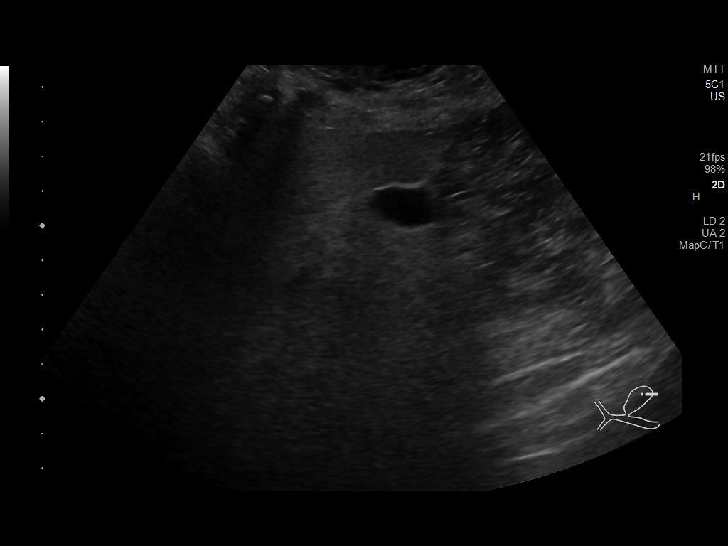
[im 18/107]
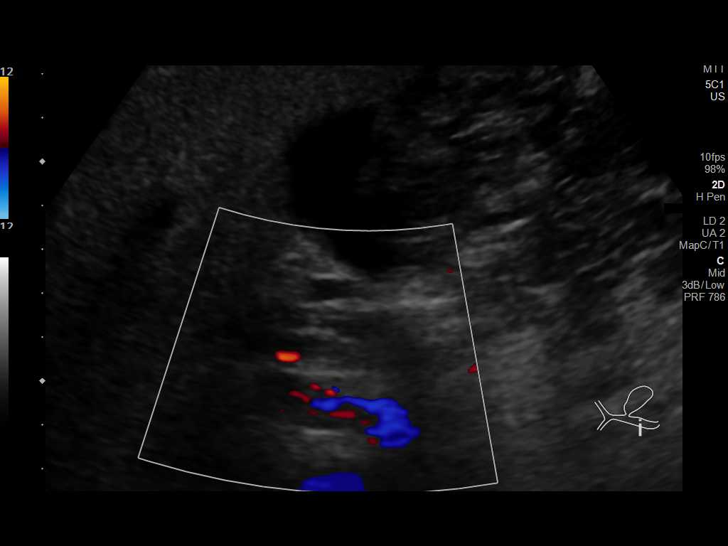
[im 27/107]
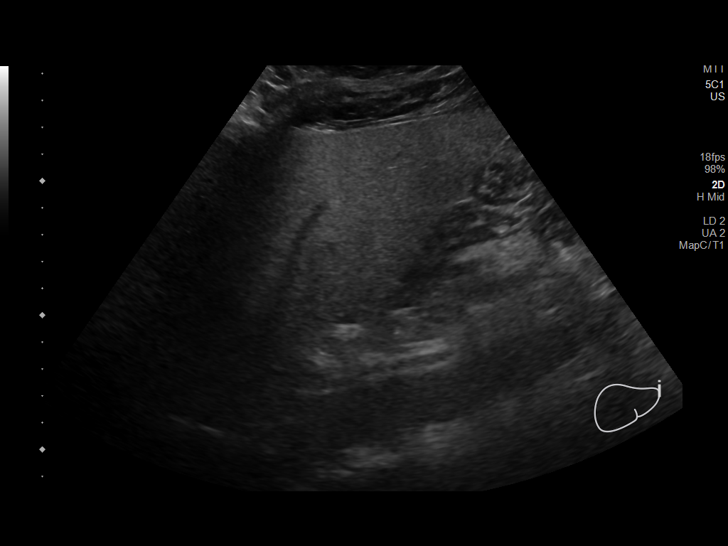
[im 36/107]
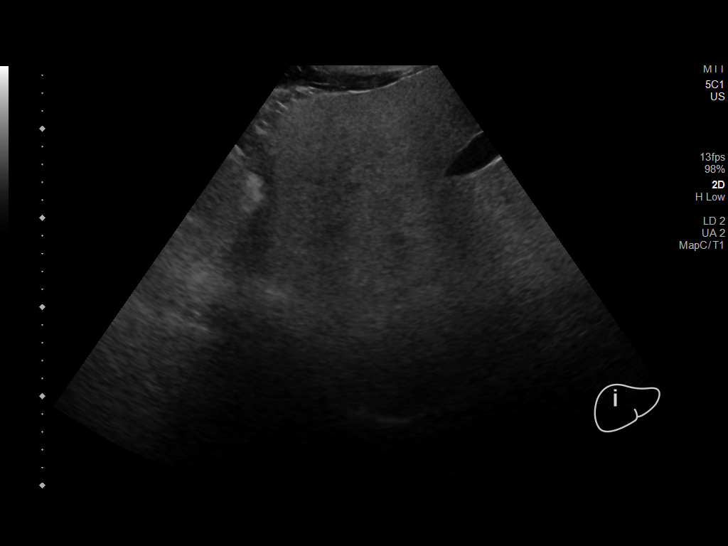
[im 40/107]
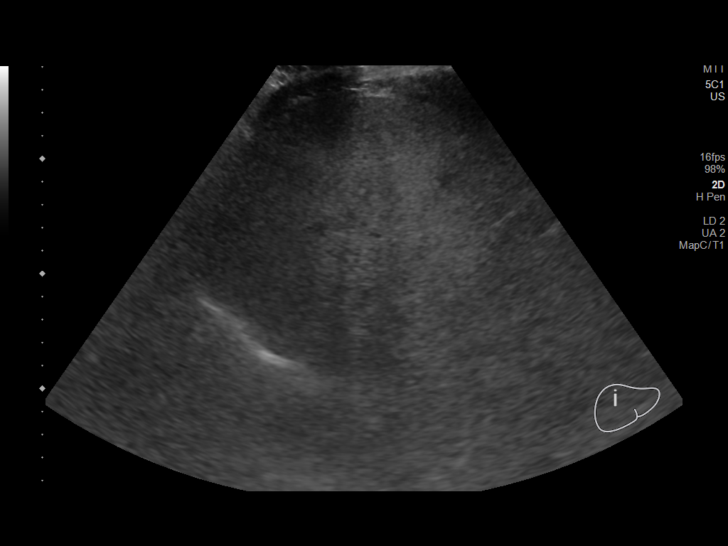
[im 49/107]
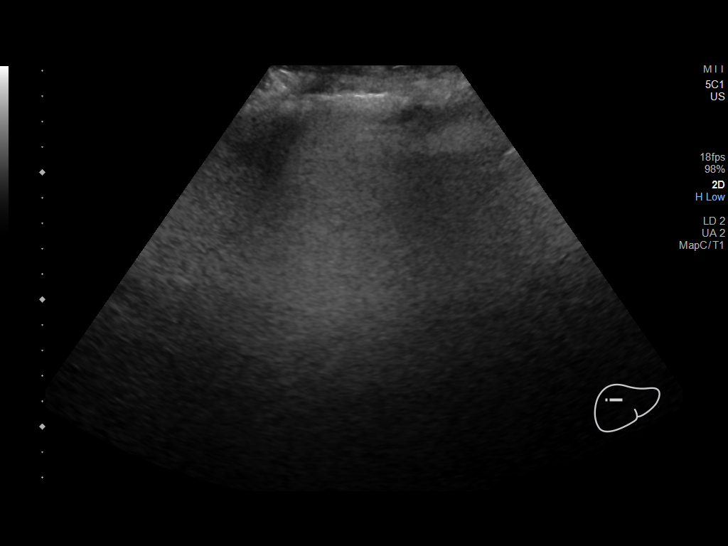
[im 58/107]
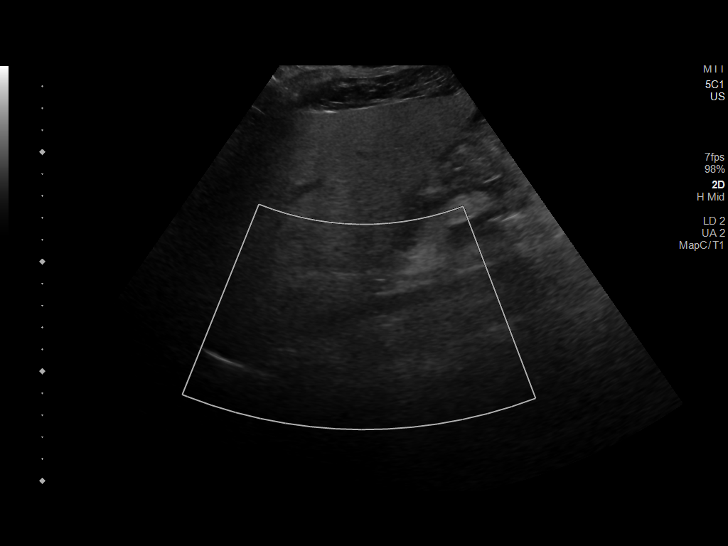
[im 67/107]
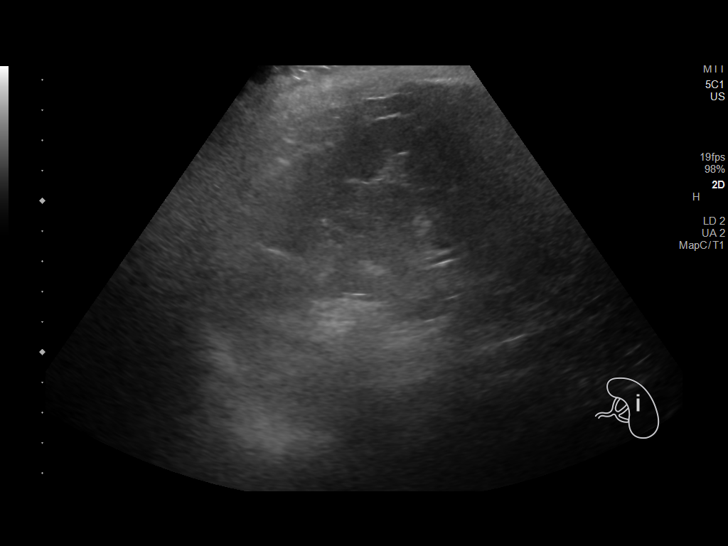
[im 71/107]
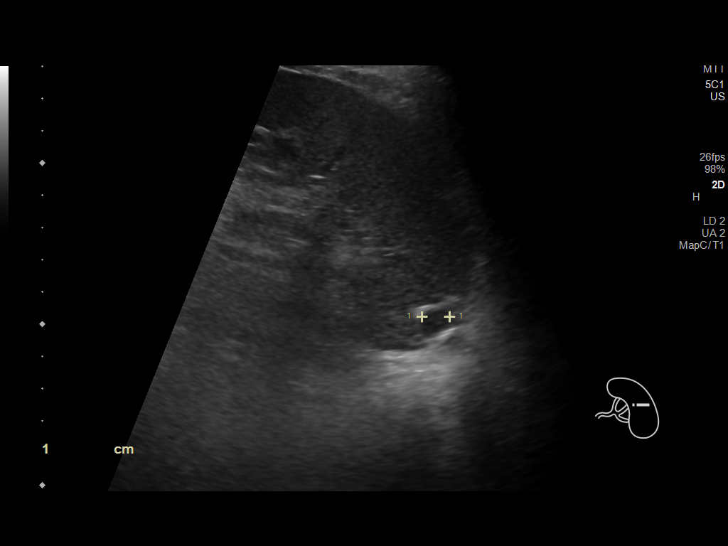
[im 80/107]
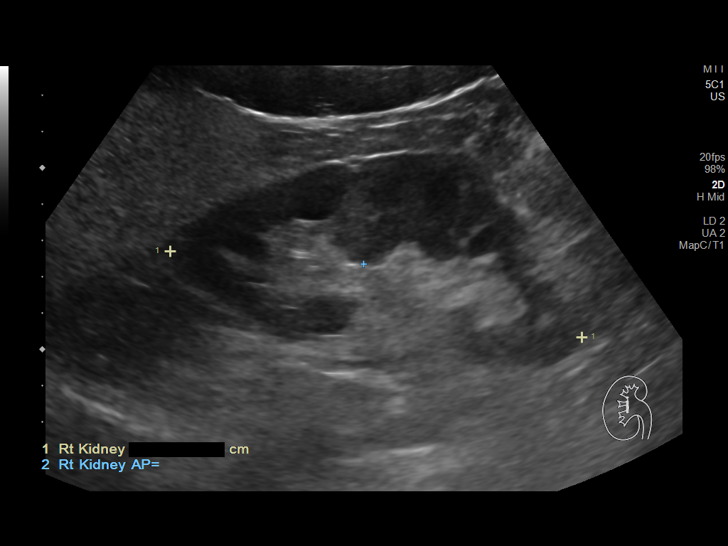
[im 89/107]
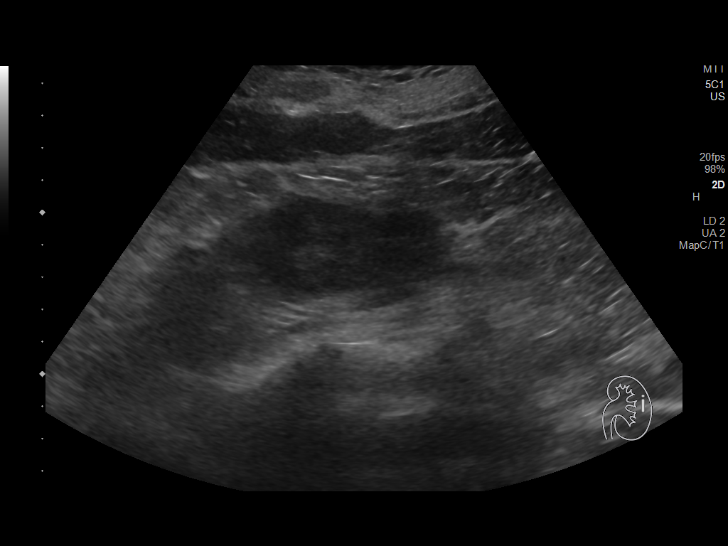
[im 98/107]
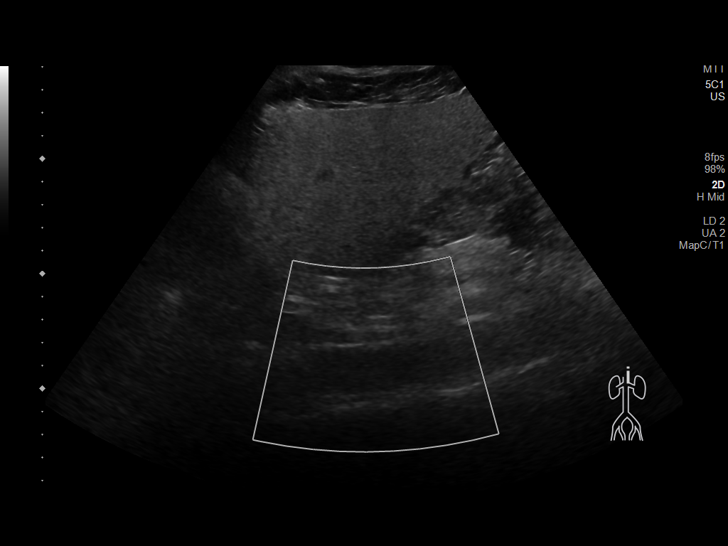
[im 107/107]
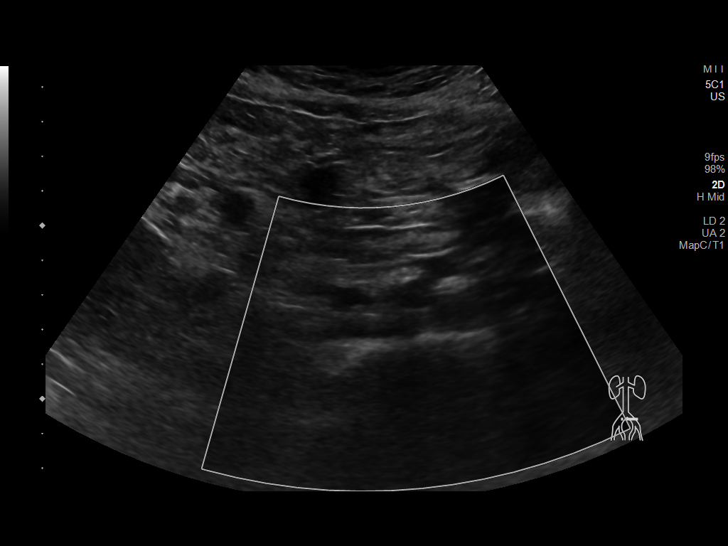

[14 of 25 positions shown; findings below may reference images not displayed]

FINDINGS: Gallbladder: No gallstones or wall thickening visualized. No
sonographic Murphy sign noted by sonographer.

Common bile duct: Diameter: 5 mm

Liver: Hyperechoic hepatic parenchyma, suggesting hepatic steatosis.
No focal hepatic lesion is seen. Portal vein is patent on color
Doppler imaging with normal direction of blood flow towards the
liver.

IVC: No abnormality visualized.

Pancreas: Visualized portion unremarkable.

Spleen: Within normal limits for size.  10 mm subcapsular cyst.

Right Kidney: Length: 11.6 cm. Echogenicity within normal limits. No
mass or hydronephrosis visualized.

Left Kidney: Length: 10.9 cm. Echogenicity within normal limits. No
mass or hydronephrosis visualized.

Abdominal aorta: No aneurysm visualized.

Other findings: None.
IMPRESSION: Hyperechoic hepatic parenchyma, suggesting hepatic steatosis.

Otherwise negative abdominal ultrasound.

## 2020-03-20 ENCOUNTER — Other Ambulatory Visit: Payer: Self-pay | Admitting: Family Medicine

## 2020-03-20 DIAGNOSIS — F419 Anxiety disorder, unspecified: Secondary | ICD-10-CM

## 2020-03-20 DIAGNOSIS — E785 Hyperlipidemia, unspecified: Secondary | ICD-10-CM

## 2020-03-20 DIAGNOSIS — F32A Depression, unspecified: Secondary | ICD-10-CM

## 2020-03-20 DIAGNOSIS — I10 Essential (primary) hypertension: Secondary | ICD-10-CM

## 2020-04-20 ENCOUNTER — Other Ambulatory Visit: Payer: Self-pay | Admitting: Family Medicine

## 2020-04-20 DIAGNOSIS — L400 Psoriasis vulgaris: Secondary | ICD-10-CM | POA: Diagnosis not present

## 2020-04-20 LAB — COMPREHENSIVE METABOLIC PANEL
Albumin: 4.5 (ref 3.5–5.0)
Calcium: 10 (ref 8.7–10.7)
GFR calc Af Amer: 79
GFR calc non Af Amer: 68
Globulin: 2.7

## 2020-04-20 LAB — CBC AND DIFFERENTIAL
HCT: 39 (ref 36–46)
Hemoglobin: 13.1 (ref 12.0–16.0)
Platelets: 193 (ref 150–399)
WBC: 6.2

## 2020-04-20 LAB — HEPATIC FUNCTION PANEL
ALT: 86 — AB (ref 7–35)
AST: 74 — AB (ref 13–35)
Alkaline Phosphatase: 70 (ref 25–125)
Bilirubin, Total: 0.6

## 2020-04-20 LAB — BASIC METABOLIC PANEL
BUN: 12 (ref 4–21)
Chloride: 101 (ref 99–108)
Creatinine: 0.9 (ref 0.5–1.1)
Glucose: 89
Potassium: 5.1 (ref 3.4–5.3)
Sodium: 140 (ref 137–147)

## 2020-04-20 LAB — CBC: RBC: 3.81 — AB (ref 3.87–5.11)

## 2020-05-02 ENCOUNTER — Other Ambulatory Visit: Payer: Self-pay

## 2020-05-05 DIAGNOSIS — Z833 Family history of diabetes mellitus: Secondary | ICD-10-CM | POA: Diagnosis not present

## 2020-05-05 DIAGNOSIS — R69 Illness, unspecified: Secondary | ICD-10-CM | POA: Diagnosis not present

## 2020-05-05 DIAGNOSIS — L309 Dermatitis, unspecified: Secondary | ICD-10-CM | POA: Diagnosis not present

## 2020-05-05 DIAGNOSIS — J45909 Unspecified asthma, uncomplicated: Secondary | ICD-10-CM | POA: Diagnosis not present

## 2020-05-05 DIAGNOSIS — E785 Hyperlipidemia, unspecified: Secondary | ICD-10-CM | POA: Diagnosis not present

## 2020-05-05 DIAGNOSIS — Z8249 Family history of ischemic heart disease and other diseases of the circulatory system: Secondary | ICD-10-CM | POA: Diagnosis not present

## 2020-05-05 DIAGNOSIS — Z823 Family history of stroke: Secondary | ICD-10-CM | POA: Diagnosis not present

## 2020-05-05 DIAGNOSIS — Z7982 Long term (current) use of aspirin: Secondary | ICD-10-CM | POA: Diagnosis not present

## 2020-05-05 DIAGNOSIS — I10 Essential (primary) hypertension: Secondary | ICD-10-CM | POA: Diagnosis not present

## 2020-05-05 DIAGNOSIS — Z809 Family history of malignant neoplasm, unspecified: Secondary | ICD-10-CM | POA: Diagnosis not present

## 2020-05-11 DIAGNOSIS — L400 Psoriasis vulgaris: Secondary | ICD-10-CM | POA: Diagnosis not present

## 2020-06-01 DIAGNOSIS — L400 Psoriasis vulgaris: Secondary | ICD-10-CM | POA: Diagnosis not present

## 2020-06-14 ENCOUNTER — Other Ambulatory Visit: Payer: Self-pay

## 2020-06-14 MED ORDER — ADVAIR DISKUS 250-50 MCG/DOSE IN AEPB: 1.0000 | INHALATION_SPRAY | Freq: Two times a day (BID) | RESPIRATORY_TRACT | 2 refills | Status: DC

## 2020-06-14 NOTE — Progress Notes (Signed)
Insurance requires brand name Advair- Rx resent.

## 2020-06-16 ENCOUNTER — Other Ambulatory Visit: Payer: Self-pay | Admitting: Family Medicine

## 2020-06-16 DIAGNOSIS — F32A Depression, unspecified: Secondary | ICD-10-CM

## 2020-06-16 DIAGNOSIS — F419 Anxiety disorder, unspecified: Secondary | ICD-10-CM

## 2020-06-16 DIAGNOSIS — E785 Hyperlipidemia, unspecified: Secondary | ICD-10-CM

## 2020-06-29 DIAGNOSIS — L400 Psoriasis vulgaris: Secondary | ICD-10-CM | POA: Diagnosis not present

## 2020-07-19 DIAGNOSIS — R21 Rash and other nonspecific skin eruption: Secondary | ICD-10-CM | POA: Diagnosis not present

## 2020-07-19 DIAGNOSIS — D485 Neoplasm of uncertain behavior of skin: Secondary | ICD-10-CM | POA: Diagnosis not present

## 2020-07-19 DIAGNOSIS — L400 Psoriasis vulgaris: Secondary | ICD-10-CM | POA: Diagnosis not present

## 2020-07-19 DIAGNOSIS — L819 Disorder of pigmentation, unspecified: Secondary | ICD-10-CM | POA: Diagnosis not present

## 2020-07-22 ENCOUNTER — Other Ambulatory Visit: Payer: Self-pay | Admitting: Family Medicine

## 2020-07-22 ENCOUNTER — Other Ambulatory Visit: Payer: Self-pay

## 2020-07-22 ENCOUNTER — Telehealth: Payer: Self-pay | Admitting: Family Medicine

## 2020-07-22 DIAGNOSIS — E785 Hyperlipidemia, unspecified: Secondary | ICD-10-CM

## 2020-07-22 DIAGNOSIS — F419 Anxiety disorder, unspecified: Secondary | ICD-10-CM

## 2020-07-22 DIAGNOSIS — F32A Depression, unspecified: Secondary | ICD-10-CM

## 2020-07-22 DIAGNOSIS — L308 Other specified dermatitis: Secondary | ICD-10-CM | POA: Diagnosis not present

## 2020-07-22 MED ORDER — CITALOPRAM HYDROBROMIDE 20 MG PO TABS: 20.0000 mg | ORAL_TABLET | Freq: Every day | ORAL | 3 refills | Status: DC

## 2020-07-22 MED ORDER — ATORVASTATIN CALCIUM 40 MG PO TABS: 40.0000 mg | ORAL_TABLET | Freq: Every day | ORAL | 3 refills | Status: DC

## 2020-07-22 NOTE — Telephone Encounter (Signed)
Sent in

## 2020-07-22 NOTE — Telephone Encounter (Signed)
Medication: citalopram (CELEXA) 20 MG tablet [[id number]]   atorvastatin (LIPITOR) 40 MG tablet [[id number]]      Has the patient contacted their pharmacy? no (If no, request that the patient contact the pharmacy for the refill.) (If yes, when and what did the pharmacy advise?)    Preferred Pharmacy (with phone number or street name):   [facility] [place], [place] - [address] Phone:  [phone]  Fax:  [phone]         Agent: Please be advised that RX refills may take up to 3 business days. We ask that you follow-up with your pharmacy.

## 2020-07-24 ENCOUNTER — Other Ambulatory Visit: Payer: Self-pay | Admitting: Family Medicine

## 2020-07-24 DIAGNOSIS — F419 Anxiety disorder, unspecified: Secondary | ICD-10-CM

## 2020-07-24 DIAGNOSIS — E785 Hyperlipidemia, unspecified: Secondary | ICD-10-CM

## 2020-07-24 NOTE — Telephone Encounter (Signed)
That is great but she has not been seen in over a year so please set her up with an appt soon

## 2020-07-25 NOTE — Telephone Encounter (Signed)
Pt is scheduled for 08/18/20 at 3:20

## 2020-08-18 ENCOUNTER — Encounter: Payer: Self-pay | Admitting: Family Medicine

## 2020-08-18 ENCOUNTER — Other Ambulatory Visit: Payer: Self-pay

## 2020-08-18 ENCOUNTER — Ambulatory Visit (INDEPENDENT_AMBULATORY_CARE_PROVIDER_SITE_OTHER): Payer: Medicare HMO | Admitting: Family Medicine

## 2020-08-18 VITALS — BP 110/64 | HR 94 | Temp 98.3°F | Resp 16 | Ht 68.0 in | Wt 184.2 lb

## 2020-08-18 DIAGNOSIS — E669 Obesity, unspecified: Secondary | ICD-10-CM

## 2020-08-18 DIAGNOSIS — F419 Anxiety disorder, unspecified: Secondary | ICD-10-CM | POA: Diagnosis not present

## 2020-08-18 DIAGNOSIS — Z1239 Encounter for other screening for malignant neoplasm of breast: Secondary | ICD-10-CM

## 2020-08-18 DIAGNOSIS — Z780 Asymptomatic menopausal state: Secondary | ICD-10-CM

## 2020-08-18 DIAGNOSIS — E538 Deficiency of other specified B group vitamins: Secondary | ICD-10-CM | POA: Diagnosis not present

## 2020-08-18 DIAGNOSIS — E2839 Other primary ovarian failure: Secondary | ICD-10-CM | POA: Diagnosis not present

## 2020-08-18 DIAGNOSIS — E559 Vitamin D deficiency, unspecified: Secondary | ICD-10-CM

## 2020-08-18 DIAGNOSIS — F32A Depression, unspecified: Secondary | ICD-10-CM

## 2020-08-18 DIAGNOSIS — J309 Allergic rhinitis, unspecified: Secondary | ICD-10-CM

## 2020-08-18 DIAGNOSIS — E782 Mixed hyperlipidemia: Secondary | ICD-10-CM | POA: Diagnosis not present

## 2020-08-18 DIAGNOSIS — E8881 Metabolic syndrome: Secondary | ICD-10-CM | POA: Diagnosis not present

## 2020-08-18 DIAGNOSIS — E785 Hyperlipidemia, unspecified: Secondary | ICD-10-CM | POA: Diagnosis not present

## 2020-08-18 DIAGNOSIS — E88819 Insulin resistance, unspecified: Secondary | ICD-10-CM

## 2020-08-18 DIAGNOSIS — L309 Dermatitis, unspecified: Secondary | ICD-10-CM | POA: Diagnosis not present

## 2020-08-18 DIAGNOSIS — L409 Psoriasis, unspecified: Secondary | ICD-10-CM

## 2020-08-18 DIAGNOSIS — I10 Essential (primary) hypertension: Secondary | ICD-10-CM | POA: Diagnosis not present

## 2020-08-18 MED ORDER — CITALOPRAM HYDROBROMIDE 20 MG PO TABS: 20.0000 mg | ORAL_TABLET | Freq: Every day | ORAL | 3 refills | Status: DC

## 2020-08-18 MED ORDER — MONTELUKAST SODIUM 10 MG PO TABS: 10.0000 mg | ORAL_TABLET | Freq: Every day | ORAL | 3 refills | Status: DC

## 2020-08-18 MED ORDER — FLUTICASONE PROPIONATE 50 MCG/ACT NA SUSP: 2.0000 | Freq: Every day | NASAL | 0 refills | Status: DC

## 2020-08-18 MED ORDER — CETIRIZINE HCL 10 MG PO TABS: 10.0000 mg | ORAL_TABLET | Freq: Two times a day (BID) | ORAL | 1 refills | Status: DC

## 2020-08-18 MED ORDER — ATORVASTATIN CALCIUM 40 MG PO TABS: 40.0000 mg | ORAL_TABLET | Freq: Every day | ORAL | 3 refills | Status: DC

## 2020-08-18 NOTE — Assessment & Plan Note (Signed)
Well controlled, no changes to meds. Encouraged heart healthy diet such as the DASH diet and exercise as tolerated.  °

## 2020-08-18 NOTE — Patient Instructions (Signed)

## 2020-08-18 NOTE — Progress Notes (Addendum)
Patient ID: Latoya [name], female    DOB: 06-20-1955  Age: 65 y.o. MRN: [id number]    Subjective:  Subjective  HPI Latoya [name] presents for office visit today. She reports being allergic to the topical cream otzla and she experienced an allergic reaction. She states she has done a biopsy and it was indicated that it was due to the drug. She states that she was put on various steroid treatments to treat the allergic reaction at [facility]. She reports developing itchiness, psoriasis and, allergic dermatitis as a result. She endorses doing a dexa scan 10 years ago. She denies any lumps or bumps on skin, chest pain, SOB, fever, abdominal pain, cough, chills, sore throat, dysuria, urinary incontinence, back pain, HA, or N/VD.   Review of Systems  Constitutional: Negative for chills, fatigue and fever.  HENT: Positive for congestion. Negative for rhinorrhea, sinus pressure, sinus pain and sore throat.   Eyes: Negative for pain.  Respiratory: Negative for cough and shortness of breath.   Cardiovascular: Negative for chest pain, palpitations and leg swelling.  Gastrointestinal: Negative for abdominal pain, blood in stool, diarrhea, nausea and vomiting.  Genitourinary: Negative for decreased urine volume, flank pain, frequency, vaginal bleeding and vaginal discharge.  Musculoskeletal: Negative for back pain.  Skin: Positive for rash.       Allergic dermatitis, itchiness, and psoriasis  Neurological: Negative for headaches.    History Past Medical History:  Diagnosis Date  . Anxiety   . Back pain   . Cancer ([place]) 12/2009   BCC, SCC  . Cervical cancer screening 12/30/2012  . Dermatitis 12/03/2007   Qualifier: Diagnosis of  By: Jerold Coombe    . Eczema 01/04/2013  . Environmental allergies   . Fatty liver   . Hyperlipidemia   . Hypertension   . Morbid obesity ([place]) 01/13/2012  . Multiple food allergies    cashew chicken, chinese, msg?  . Obesity 01/13/2012  . Overweight 01/13/2012  . Pain  of toe of left foot 08/23/2014  . Preventative health care 01/04/2013  . Vitamin D deficiency     She has a past surgical history that includes dilation and currettage; Mohs surgery (12/2009); and Breast biopsy (Right).   Her family history includes Cancer in her father and maternal grandmother; Cancer (age of onset: 3) in her paternal grandmother; Diabetes in her brother, father, and mother; Heart Problems in her father and mother; Heart disease in her father and mother; Hyperlipidemia in her father and mother; Hypertension in her father and mother; Melanoma in an other family member; Obesity in her brother, brother, father, and sister; Psoriasis in her brother.She reports that she has quit smoking. She has never used smokeless tobacco. She reports current alcohol use. She reports that she does not use drugs.  Current Outpatient Medications on File Prior to Visit  Medication Sig Dispense Refill  . ADVAIR DISKUS 250-50 MCG/DOSE AEPB Inhale 1 puff into the lungs in the morning and at bedtime. 60 each 2  . albuterol (VENTOLIN HFA) 108 (90 Base) MCG/ACT inhaler Inhale 2 puffs into the lungs every 6 (six) hours as needed for wheezing or shortness of breath. 18 g 1  . ALPRAZolam (XANAX) 0.25 MG tablet Take 1-2 tablets (0.25-0.5 mg total) by mouth at bedtime as needed for sleep or anxiety. 20 tablet 2  . losartan (COZAAR) 100 MG tablet TAKE ONE TABLET BY MOUTH DAILY 90 tablet 1   No current facility-administered medications on file prior to visit.  Objective:  Objective  Physical Exam Constitutional:      General: She is not in acute distress.    Appearance: Normal appearance. She is not ill-appearing or toxic-appearing.  HENT:     Head: Normocephalic and atraumatic.     Right Ear: Tympanic membrane, ear canal and external ear normal.     Left Ear: Tympanic membrane, ear canal and external ear normal.     Nose: No congestion or rhinorrhea.  Eyes:     Extraocular Movements: Extraocular  movements intact.     Pupils: Pupils are equal, round, and reactive to light.  Cardiovascular:     Rate and Rhythm: Normal rate and regular rhythm.     Pulses: Normal pulses.     Heart sounds: Normal heart sounds. No murmur heard.   Pulmonary:     Effort: Pulmonary effort is normal. No respiratory distress.     Breath sounds: Normal breath sounds. No wheezing, rhonchi or rales.  Abdominal:     General: Bowel sounds are normal.     Palpations: Abdomen is soft. There is no mass.     Tenderness: There is no abdominal tenderness. There is no guarding.     Hernia: No hernia is present.  Musculoskeletal:        General: Normal range of motion.     Cervical back: Normal range of motion and neck supple.  Skin:    General: Skin is warm and dry.     Findings: Rash present.     Comments: Scattered rate plaques slight raised throughout arms and trunks  Neurological:     Mental Status: She is alert and oriented to person, place, and time.  Psychiatric:        Behavior: Behavior normal.    BP 110/64   Pulse 94   Temp 98.3 F (36.8 C)   Resp 16   Ht 5\' 8"  (1.727 m)   Wt 184 lb 3.2 oz (83.6 kg)   LMP  (LMP Unknown) Comment: 2012 ?  SpO2 93%   BMI 28.01 kg/m  Wt Readings from Last 3 Encounters:  08/18/20 184 lb 3.2 oz (83.6 kg)  05/15/18 200 lb (90.7 kg)  04/15/18 199 lb 6.4 oz (90.4 kg)     Lab Results  Component Value Date   WBC 8.7 08/18/2020   HGB 13.8 08/18/2020   HCT 40.7 08/18/2020   PLT 203.0 08/18/2020   GLUCOSE 100 (H) 08/18/2020   CHOL 223 (H) 08/18/2020   TRIG 130.0 08/18/2020   HDL 101.30 08/18/2020   LDLDIRECT 144.9 12/23/2008   LDLCALC 96 08/18/2020   ALT 72 (H) 08/18/2020   AST 59 (H) 08/18/2020   NA 137 08/18/2020   K 4.5 08/18/2020   CL 97 08/18/2020   CREATININE 1.08 08/18/2020   BUN 29 (H) 08/18/2020   CO2 28 08/18/2020   TSH 0.92 08/18/2020   HGBA1C 5.8 08/18/2020    US Abdomen Complete  Result Date: 02/13/2019 CLINICAL DATA:  Elevated LFTs  EXAM: ABDOMEN ULTRASOUND COMPLETE COMPARISON:  None. FINDINGS: Gallbladder: No gallstones or wall thickening visualized. No sonographic Murphy sign noted by sonographer. Common bile duct: Diameter: 5 mm Liver: Hyperechoic hepatic parenchyma, suggesting hepatic steatosis. No focal hepatic lesion is seen. Portal vein is patent on color Doppler imaging with normal direction of blood flow towards the liver. IVC: No abnormality visualized. Pancreas: Visualized portion unremarkable. Spleen: Within normal limits for size.  10 mm subcapsular cyst. Right Kidney: Length: 11.6 cm. Echogenicity within normal limits. No  mass or hydronephrosis visualized. Left Kidney: Length: 10.9 cm. Echogenicity within normal limits. No mass or hydronephrosis visualized. Abdominal aorta: No aneurysm visualized. Other findings: None. IMPRESSION: Hyperechoic hepatic parenchyma, suggesting hepatic steatosis. Otherwise negative abdominal ultrasound. Electronically Signed   By: Julian Hy M.D.   On: 02/13/2019 20:49     Assessment & Plan:  Plan    Meds ordered this encounter  Medications  . atorvastatin (LIPITOR) 40 MG tablet    Sig: Take 1 tablet (40 mg total) by mouth daily.    Dispense:  90 tablet    Refill:  3    Requested drug refills are authorized, however, the patient needs further evaluation and/or laboratory testing before further refills are given. Ask her to make an appointment for this.  . cetirizine (ZYRTEC) 10 MG tablet    Sig: Take 1 tablet (10 mg total) by mouth 2 (two) times daily.    Dispense:  180 tablet    Refill:  1  . fluticasone (FLONASE) 50 MCG/ACT nasal spray    Sig: Place 2 sprays into both nostrils daily. Overdue for appt    Dispense:  16 g    Refill:  0  . montelukast (SINGULAIR) 10 MG tablet    Sig: Take 1 tablet (10 mg total) by mouth at bedtime.    Dispense:  90 tablet    Refill:  3  . citalopram (CELEXA) 20 MG tablet    Sig: Take 1 tablet (20 mg total) by mouth daily.    Dispense:   90 tablet    Refill:  3    Requested drug refills are authorized, however, the patient needs further evaluation and/or laboratory testing before further refills are given. Ask her to make an appointment for this.    Problem List Items Addressed This Visit    Hyperlipidemia, mixed - Primary    Encouraged heart healthy diet, increase exercise, avoid trans fats, consider a krill oil cap daily      Relevant Medications   atorvastatin (LIPITOR) 40 MG tablet   Other Relevant Orders   Lipid panel (Completed)   Essential hypertension    Well controlled, no changes to meds. Encouraged heart healthy diet such as the DASH diet and exercise as tolerated.       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 Increase exercise as tolerated.       Relevant Orders   Hemoglobin A1c (Completed)   Psoriasis    On Skyrizi       Allergic rhinitis    Given refills on Zyrtec, Flonase and Singulair       Other Visit Diagnoses    Encounter for screening for malignant neoplasm of breast, unspecified screening modality       Relevant Orders   MM 3D SCREEN BREAST BILATERAL  Post-menopausal       Relevant Orders   DG Bone Density   Estrogen deficiency       Relevant Orders   DG Bone Density   Hyperlipidemia, unspecified hyperlipidemia type       Relevant Medications   atorvastatin  (LIPITOR) 40 MG tablet   Anxiety and depression       Relevant Medications   citalopram (CELEXA) 20 MG tablet      Follow-up: Return in about 6 months (around 02/18/2021) for annual exam.  I, Lynann Beaver as a scribe for Penni Homans, MD.,have documented all relevant documentation on the behalf of Penni Homans, MD,as directed by  Penni Homans, MD while in the presence of Penni Homans, MD.  I, Mosie Lukes, MD personally performed the services described in this documentation. All medical record entries made by the scribe were at my direction and in my presence. I have reviewed the chart and agree that the record reflects my personal performance and is accurate and complete

## 2020-08-19 LAB — HEMOGLOBIN A1C: Hgb A1c MFr Bld: 5.8 % (ref 4.6–6.5)

## 2020-08-19 LAB — CBC
HCT: 40.7 % (ref 36.0–46.0)
Hemoglobin: 13.8 g/dL (ref 12.0–15.0)
MCHC: 33.8 g/dL (ref 30.0–36.0)
MCV: 103.5 fl — ABNORMAL HIGH (ref 78.0–100.0)
Platelets: 203 10*3/uL (ref 150.0–400.0)
RBC: 3.93 Mil/uL (ref 3.87–5.11)
RDW: 14.8 % (ref 11.5–15.5)
WBC: 8.7 10*3/uL (ref 4.0–10.5)

## 2020-08-19 LAB — LIPID PANEL
Cholesterol: 223 mg/dL — ABNORMAL HIGH (ref 0–200)
HDL: 101.3 mg/dL (ref >39.00)
LDL Cholesterol: 96 mg/dL (ref 0–99)
NonHDL: 121.61
Total CHOL/HDL Ratio: 2
Triglycerides: 130 mg/dL (ref 0.0–149.0)
VLDL: 26 mg/dL (ref 0.0–40.0)

## 2020-08-19 LAB — TSH: TSH: 0.92 u[IU]/mL (ref 0.35–4.50)

## 2020-08-19 LAB — COMPREHENSIVE METABOLIC PANEL
ALT: 72 U/L — ABNORMAL HIGH (ref 0–35)
AST: 59 U/L — ABNORMAL HIGH (ref 0–37)
Albumin: 4.5 g/dL (ref 3.5–5.2)
Alkaline Phosphatase: 53 U/L (ref 39–117)
BUN: 29 mg/dL — ABNORMAL HIGH (ref 6–23)
CO2: 28 mEq/L (ref 19–32)
Calcium: 9.8 mg/dL (ref 8.4–10.5)
Chloride: 97 mEq/L (ref 96–112)
Creatinine, Ser: 1.08 mg/dL (ref 0.40–1.20)
GFR: 53.95 mL/min — ABNORMAL LOW (ref >60.00)
Glucose, Bld: 100 mg/dL — ABNORMAL HIGH (ref 70–99)
Potassium: 4.5 mEq/L (ref 3.5–5.1)
Sodium: 137 mEq/L (ref 135–145)
Total Bilirubin: 0.8 mg/dL (ref 0.2–1.2)
Total Protein: 7.3 g/dL (ref 6.0–8.3)

## 2020-08-19 LAB — VITAMIN B12: Vitamin B-12: 248 pg/mL (ref 211–911)

## 2020-08-19 LAB — VITAMIN D 25 HYDROXY (VIT D DEFICIENCY, FRACTURES): VITD: 41.42 ng/mL (ref 30.00–100.00)

## 2020-08-20 DIAGNOSIS — J309 Allergic rhinitis, unspecified: Secondary | ICD-10-CM | POA: Insufficient documentation

## 2020-08-20 DIAGNOSIS — L409 Psoriasis, unspecified: Secondary | ICD-10-CM | POA: Insufficient documentation

## 2020-08-20 NOTE — Assessment & Plan Note (Signed)
On [employer]

## 2020-08-20 NOTE — Assessment & Plan Note (Signed)
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

## 2020-08-20 NOTE — Assessment & Plan Note (Signed)
hgba1c acceptable, minimize simple carbs. Increase exercise as tolerated.  

## 2020-08-20 NOTE — Assessment & Plan Note (Signed)
Encouraged heart healthy diet, increase exercise, avoid trans fats, consider a krill oil cap daily 

## 2020-08-20 NOTE — Assessment & Plan Note (Signed)
Supplement and monitor 

## 2020-08-20 NOTE — Assessment & Plan Note (Signed)
Given refills on Zyrtec, Flonase and Singulair

## 2020-08-24 DIAGNOSIS — N951 Menopausal and female climacteric states: Secondary | ICD-10-CM | POA: Diagnosis not present

## 2020-08-24 DIAGNOSIS — E349 Endocrine disorder, unspecified: Secondary | ICD-10-CM | POA: Diagnosis not present

## 2020-08-24 DIAGNOSIS — E559 Vitamin D deficiency, unspecified: Secondary | ICD-10-CM | POA: Diagnosis not present

## 2020-08-24 DIAGNOSIS — E039 Hypothyroidism, unspecified: Secondary | ICD-10-CM | POA: Diagnosis not present

## 2020-08-24 DIAGNOSIS — R871 Abnormal level of hormones in specimens from female genital organs: Secondary | ICD-10-CM | POA: Diagnosis not present

## 2020-08-24 DIAGNOSIS — R5381 Other malaise: Secondary | ICD-10-CM | POA: Diagnosis not present

## 2020-08-31 DIAGNOSIS — Z1231 Encounter for screening mammogram for malignant neoplasm of breast: Secondary | ICD-10-CM | POA: Diagnosis not present

## 2020-08-31 DIAGNOSIS — M85852 Other specified disorders of bone density and structure, left thigh: Secondary | ICD-10-CM | POA: Diagnosis not present

## 2020-08-31 LAB — HM MAMMOGRAPHY

## 2020-08-31 LAB — HM DEXA SCAN

## 2020-09-14 ENCOUNTER — Other Ambulatory Visit: Payer: Self-pay | Admitting: Family Medicine

## 2020-09-22 ENCOUNTER — Ambulatory Visit (HOSPITAL_BASED_OUTPATIENT_CLINIC_OR_DEPARTMENT_OTHER): Payer: Medicare HMO

## 2020-09-22 ENCOUNTER — Other Ambulatory Visit (HOSPITAL_BASED_OUTPATIENT_CLINIC_OR_DEPARTMENT_OTHER): Payer: Medicare HMO

## 2020-09-23 ENCOUNTER — Other Ambulatory Visit: Payer: Self-pay | Admitting: Family Medicine

## 2020-09-23 DIAGNOSIS — I10 Essential (primary) hypertension: Secondary | ICD-10-CM

## 2020-10-10 DIAGNOSIS — N951 Menopausal and female climacteric states: Secondary | ICD-10-CM | POA: Diagnosis not present

## 2020-10-10 DIAGNOSIS — M2550 Pain in unspecified joint: Secondary | ICD-10-CM | POA: Diagnosis not present

## 2020-10-10 DIAGNOSIS — R5381 Other malaise: Secondary | ICD-10-CM | POA: Diagnosis not present

## 2020-10-13 DIAGNOSIS — H43393 Other vitreous opacities, bilateral: Secondary | ICD-10-CM | POA: Diagnosis not present

## 2020-10-13 DIAGNOSIS — H524 Presbyopia: Secondary | ICD-10-CM | POA: Diagnosis not present

## 2020-10-13 DIAGNOSIS — H2513 Age-related nuclear cataract, bilateral: Secondary | ICD-10-CM | POA: Diagnosis not present

## 2020-11-29 DIAGNOSIS — H52223 Regular astigmatism, bilateral: Secondary | ICD-10-CM | POA: Diagnosis not present

## 2020-11-29 DIAGNOSIS — H18593 Other hereditary corneal dystrophies, bilateral: Secondary | ICD-10-CM | POA: Diagnosis not present

## 2020-11-29 DIAGNOSIS — H2513 Age-related nuclear cataract, bilateral: Secondary | ICD-10-CM | POA: Diagnosis not present

## 2020-12-20 DIAGNOSIS — H2513 Age-related nuclear cataract, bilateral: Secondary | ICD-10-CM | POA: Diagnosis not present

## 2020-12-27 DIAGNOSIS — Z79890 Hormone replacement therapy (postmenopausal): Secondary | ICD-10-CM | POA: Diagnosis not present

## 2020-12-27 DIAGNOSIS — Z0000 Encounter for general adult medical examination without abnormal findings: Secondary | ICD-10-CM | POA: Diagnosis not present

## 2020-12-27 DIAGNOSIS — E785 Hyperlipidemia, unspecified: Secondary | ICD-10-CM | POA: Diagnosis not present

## 2020-12-27 DIAGNOSIS — Z01818 Encounter for other preprocedural examination: Secondary | ICD-10-CM | POA: Diagnosis not present

## 2020-12-27 DIAGNOSIS — J4520 Mild intermittent asthma, uncomplicated: Secondary | ICD-10-CM | POA: Diagnosis not present

## 2021-01-02 DIAGNOSIS — J45990 Exercise induced bronchospasm: Secondary | ICD-10-CM | POA: Diagnosis not present

## 2021-01-02 DIAGNOSIS — E785 Hyperlipidemia, unspecified: Secondary | ICD-10-CM | POA: Diagnosis not present

## 2021-01-02 DIAGNOSIS — H25811 Combined forms of age-related cataract, right eye: Secondary | ICD-10-CM | POA: Diagnosis not present

## 2021-01-02 DIAGNOSIS — Z7951 Long term (current) use of inhaled steroids: Secondary | ICD-10-CM | POA: Diagnosis not present

## 2021-01-02 DIAGNOSIS — I10 Essential (primary) hypertension: Secondary | ICD-10-CM | POA: Diagnosis not present

## 2021-01-02 DIAGNOSIS — Z85828 Personal history of other malignant neoplasm of skin: Secondary | ICD-10-CM | POA: Diagnosis not present

## 2021-01-02 DIAGNOSIS — H2511 Age-related nuclear cataract, right eye: Secondary | ICD-10-CM | POA: Diagnosis not present

## 2021-01-02 DIAGNOSIS — K219 Gastro-esophageal reflux disease without esophagitis: Secondary | ICD-10-CM | POA: Diagnosis not present

## 2021-01-02 DIAGNOSIS — R69 Illness, unspecified: Secondary | ICD-10-CM | POA: Diagnosis not present

## 2021-01-16 DIAGNOSIS — H2512 Age-related nuclear cataract, left eye: Secondary | ICD-10-CM | POA: Diagnosis not present

## 2021-01-16 DIAGNOSIS — Z7951 Long term (current) use of inhaled steroids: Secondary | ICD-10-CM | POA: Diagnosis not present

## 2021-01-16 DIAGNOSIS — H25812 Combined forms of age-related cataract, left eye: Secondary | ICD-10-CM | POA: Diagnosis not present

## 2021-01-16 DIAGNOSIS — J45909 Unspecified asthma, uncomplicated: Secondary | ICD-10-CM | POA: Diagnosis not present

## 2021-01-16 DIAGNOSIS — Z87891 Personal history of nicotine dependence: Secondary | ICD-10-CM | POA: Diagnosis not present

## 2021-01-16 DIAGNOSIS — I10 Essential (primary) hypertension: Secondary | ICD-10-CM | POA: Diagnosis not present

## 2021-01-16 DIAGNOSIS — E785 Hyperlipidemia, unspecified: Secondary | ICD-10-CM | POA: Diagnosis not present

## 2021-01-16 DIAGNOSIS — K219 Gastro-esophageal reflux disease without esophagitis: Secondary | ICD-10-CM | POA: Diagnosis not present

## 2021-03-29 ENCOUNTER — Other Ambulatory Visit: Payer: Self-pay

## 2021-03-30 ENCOUNTER — Other Ambulatory Visit (HOSPITAL_COMMUNITY)
Admission: RE | Admit: 2021-03-30 | Discharge: 2021-03-30 | Disposition: A | Discharge status: Home / Self Care | Payer: Medicare HMO | Source: Ambulatory Visit | Attending: Family Medicine | Admitting: Family Medicine

## 2021-03-30 ENCOUNTER — Telehealth: Payer: Self-pay | Admitting: Family Medicine

## 2021-03-30 ENCOUNTER — Ambulatory Visit (INDEPENDENT_AMBULATORY_CARE_PROVIDER_SITE_OTHER): Payer: Medicare HMO | Admitting: Family Medicine

## 2021-03-30 ENCOUNTER — Encounter: Payer: Self-pay | Admitting: Family Medicine

## 2021-03-30 VITALS — BP 122/70 | HR 58 | Temp 98.8°F | Resp 16 | Ht 67.5 in | Wt 183.0 lb

## 2021-03-30 DIAGNOSIS — E8881 Metabolic syndrome: Secondary | ICD-10-CM

## 2021-03-30 DIAGNOSIS — R69 Illness, unspecified: Secondary | ICD-10-CM | POA: Diagnosis not present

## 2021-03-30 DIAGNOSIS — Z23 Encounter for immunization: Secondary | ICD-10-CM | POA: Diagnosis not present

## 2021-03-30 DIAGNOSIS — E538 Deficiency of other specified B group vitamins: Secondary | ICD-10-CM

## 2021-03-30 DIAGNOSIS — Z124 Encounter for screening for malignant neoplasm of cervix: Secondary | ICD-10-CM | POA: Diagnosis not present

## 2021-03-30 DIAGNOSIS — E559 Vitamin D deficiency, unspecified: Secondary | ICD-10-CM

## 2021-03-30 DIAGNOSIS — Z0000 Encounter for general adult medical examination without abnormal findings: Secondary | ICD-10-CM

## 2021-03-30 DIAGNOSIS — I10 Essential (primary) hypertension: Secondary | ICD-10-CM | POA: Diagnosis not present

## 2021-03-30 DIAGNOSIS — E782 Mixed hyperlipidemia: Secondary | ICD-10-CM

## 2021-03-30 DIAGNOSIS — Z79899 Other long term (current) drug therapy: Secondary | ICD-10-CM | POA: Diagnosis not present

## 2021-03-30 DIAGNOSIS — F419 Anxiety disorder, unspecified: Secondary | ICD-10-CM

## 2021-03-30 LAB — CBC WITH DIFFERENTIAL/PLATELET
Basophils Absolute: 0.1 10*3/uL (ref 0.0–0.1)
Basophils Relative: 0.9 % (ref 0.0–3.0)
Eosinophils Absolute: 0.8 10*3/uL — ABNORMAL HIGH (ref 0.0–0.7)
Eosinophils Relative: 11.3 % — ABNORMAL HIGH (ref 0.0–5.0)
HCT: 41.2 % (ref 36.0–46.0)
Hemoglobin: 13.7 g/dL (ref 12.0–15.0)
Lymphocytes Relative: 20.4 % (ref 12.0–46.0)
Lymphs Abs: 1.4 10*3/uL (ref 0.7–4.0)
MCHC: 33.2 g/dL (ref 30.0–36.0)
MCV: 105.5 fl — ABNORMAL HIGH (ref 78.0–100.0)
Monocytes Absolute: 0.8 10*3/uL (ref 0.1–1.0)
Monocytes Relative: 11.8 % (ref 3.0–12.0)
Neutro Abs: 3.8 10*3/uL (ref 1.4–7.7)
Neutrophils Relative %: 55.6 % (ref 43.0–77.0)
Platelets: 212 10*3/uL (ref 150.0–400.0)
RBC: 3.91 Mil/uL (ref 3.87–5.11)
RDW: 14.4 % (ref 11.5–15.5)
WBC: 6.9 10*3/uL (ref 4.0–10.5)

## 2021-03-30 LAB — VITAMIN B12: Vitamin B-12: 446 pg/mL (ref 211–911)

## 2021-03-30 LAB — COMPREHENSIVE METABOLIC PANEL
ALT: 36 U/L — ABNORMAL HIGH (ref 0–35)
AST: 38 U/L — ABNORMAL HIGH (ref 0–37)
Albumin: 4.5 g/dL (ref 3.5–5.2)
Alkaline Phosphatase: 55 U/L (ref 39–117)
BUN: 15 mg/dL (ref 6–23)
CO2: 27 mEq/L (ref 19–32)
Calcium: 9.4 mg/dL (ref 8.4–10.5)
Chloride: 99 mEq/L (ref 96–112)
Creatinine, Ser: 1.11 mg/dL (ref 0.40–1.20)
GFR: 51.98 mL/min — ABNORMAL LOW (ref >60.00)
Glucose, Bld: 84 mg/dL (ref 70–99)
Potassium: 4.5 mEq/L (ref 3.5–5.1)
Sodium: 136 mEq/L (ref 135–145)
Total Bilirubin: 0.7 mg/dL (ref 0.2–1.2)
Total Protein: 7.3 g/dL (ref 6.0–8.3)

## 2021-03-30 LAB — LIPID PANEL
Cholesterol: 180 mg/dL (ref 0–200)
HDL: 81.1 mg/dL (ref >39.00)
LDL Cholesterol: 84 mg/dL (ref 0–99)
NonHDL: 98.78
Total CHOL/HDL Ratio: 2
Triglycerides: 72 mg/dL (ref 0.0–149.0)
VLDL: 14.4 mg/dL (ref 0.0–40.0)

## 2021-03-30 LAB — VITAMIN D 25 HYDROXY (VIT D DEFICIENCY, FRACTURES): VITD: 51.52 ng/mL (ref 30.00–100.00)

## 2021-03-30 LAB — TSH: TSH: 1.17 u[IU]/mL (ref 0.35–5.50)

## 2021-03-30 MED ORDER — CETIRIZINE HCL 10 MG PO TABS: 10.0000 mg | ORAL_TABLET | Freq: Two times a day (BID) | ORAL | 1 refills | Status: AC

## 2021-03-30 MED ORDER — FLUTICASONE PROPIONATE 50 MCG/ACT NA SUSP: 2.0000 | Freq: Every day | NASAL | 5 refills | Status: AC

## 2021-03-30 MED ORDER — LOSARTAN POTASSIUM 100 MG PO TABS: 100.0000 mg | ORAL_TABLET | Freq: Every day | ORAL | 1 refills | Status: DC

## 2021-03-30 MED ORDER — ALPRAZOLAM 0.25 MG PO TABS: 0.2500 mg | ORAL_TABLET | Freq: Every evening | ORAL | 2 refills | Status: AC | PRN

## 2021-03-30 MED ORDER — FLUTICASONE-SALMETEROL 250-50 MCG/ACT IN AEPB: 1.0000 | INHALATION_SPRAY | Freq: Two times a day (BID) | RESPIRATORY_TRACT | 5 refills | Status: AC

## 2021-03-30 NOTE — Assessment & Plan Note (Signed)
Supplement and monitor 

## 2021-03-30 NOTE — Telephone Encounter (Signed)
[facility] cytology department called and stated that order was placed and they wanted to know if Dr. Charlett Blake wanted a pap included.  [phone]

## 2021-03-30 NOTE — Telephone Encounter (Signed)
Replaced with correct order

## 2021-03-30 NOTE — Patient Instructions (Addendum)
Please contact your insurance to see if they will cover the Shingrix vaccine (to prevent shingles). This is a 2 dose vaccine series. If this is covered, please contact our office to arrange a nurse visit and will give you the vaccine.  Covid bivalent booster available at [employer] Mon-Fri, 9am-3pm, walk-ins available or your preferred pharmacy.  Recommend calcium intake of 1200 to 1500 mg daily, divided into roughly 3 doses. Best source is the diet and a single dairy serving is about 500 mg, a supplement of calcium citrate once or twice daily to balance diet is fine if not getting enough in diet. Also need Vitamin D 2000 IU caps, 1 cap daily if not already taking vitamin D. Also recommend weight baring exercise on hips and upper body to keep bones strong   Preventive Care 66 Years and Older, Female Preventive care refers to lifestyle choices and visits with your health care provider that can promote health and wellness. This includes: A yearly physical exam. This is also called an annual wellness visit. Regular dental and eye exams. Immunizations. Screening for certain conditions. Healthy lifestyle choices, such as: Eating a healthy diet. Getting regular exercise. Not using drugs or products that contain nicotine and tobacco. Limiting alcohol use. What can I expect for my preventive care visit? Physical exam Your health care provider will check your: Height and weight. These may be used to calculate your BMI (body mass index). BMI is a measurement that tells if you are at a healthy weight. Heart rate and blood pressure. Body temperature. Skin for abnormal spots. Counseling Your health care provider may ask you questions about your: Past medical problems. Family's medical history. Alcohol, tobacco, and drug use. Emotional well-being. Home life and relationship well-being. Sexual activity. Diet, exercise, and sleep habits. History of falls. Memory and  ability to understand (cognition). Work and work Statistician. Pregnancy and menstrual history. Access to firearms. What immunizations do I need? Vaccines are usually given at various ages, according to a schedule. Your health care provider will recommend vaccines for you based on your age, medical history, and lifestyle or other factors, such as travel or where you work. What tests do I need? Blood tests Lipid and cholesterol levels. These may be checked every 5 years, or more often depending on your overall health. Hepatitis C test. Hepatitis B test. Screening Lung cancer screening. You may have this screening every year starting at age 66 if you have a 30-pack-year history of smoking and currently smoke or have quit within the past 15 years. Colorectal cancer screening. All adults should have this screening starting at age 66 and continuing until age 57. Your health care provider may recommend screening at age 66 if you are at increased risk. You will have tests every 1-10 years, depending on your results and the type of screening test. Diabetes screening. This is done by checking your blood sugar (glucose) after you have not eaten for a while (fasting). You may have this done every 1-3 years. Mammogram. This may be done every 1-2 years. Talk with your health care provider about how often you should have regular mammograms. Abdominal aortic aneurysm (AAA) screening. You may need this if you are a current or former smoker. BRCA-related cancer screening. This may be done if you have a family history of breast, ovarian, tubal, or peritoneal cancers. Other tests STD (sexually transmitted disease) testing, if you are at risk. Bone density scan. This is done to screen for osteoporosis. You may  have this done starting at age 66. Talk with your health care provider about your test results, treatment options, and if necessary, the need for more tests. Follow these instructions at home: Eating  and drinking  Eat a diet that includes fresh fruits and vegetables, whole grains, lean protein, and low-fat dairy products. Limit your intake of foods with high amounts of sugar, saturated fats, and salt. Take vitamin and mineral supplements as recommended by your health care provider. Do not drink alcohol if your health care provider tells you not to drink. If you drink alcohol: Limit how much you have to 0-1 drink a day. Be aware of how much alcohol is in your drink. In the [place] one drink equals one 12 oz bottle of beer (355 mL), one 5 oz glass of wine (148 mL), or one 1 oz glass of hard liquor (44 mL). Lifestyle Take daily care of your teeth and gums. Brush your teeth every morning and night with fluoride toothpaste. Floss one time each day. Stay active. Exercise for at least 30 minutes 5 or more days each week. Do not use any products that contain nicotine or tobacco, such as cigarettes, e-cigarettes, and chewing tobacco. If you need help quitting, ask your health care provider. Do not use drugs. If you are sexually active, practice safe sex. Use a condom or other form of protection in order to prevent STIs (sexually transmitted infections). Talk with your health care provider about taking a low-dose aspirin or statin. Find healthy ways to cope with stress, such as: Meditation, yoga, or listening to music. Journaling. Talking to a trusted person. Spending time with friends and family. Safety Always wear your seat belt while driving or riding in a vehicle. Do not drive: If you have been drinking alcohol. Do not ride with someone who has been drinking. When you are tired or distracted. While texting. Wear a helmet and other protective equipment during sports activities. If you have firearms in your house, make sure you follow all gun safety procedures. What's next? Visit your health care provider once a year for an annual wellness visit. Ask your health care provider how often you  should have your eyes and teeth checked. Stay up to date on all vaccines. This information is not intended to replace advice given to you by your health care provider. Make sure you discuss any questions you have with your health care provider. Document Revised: 07/22/2020 Document Reviewed: 05/08/2018 Elsevier Patient Education  2022 [employer].

## 2021-03-30 NOTE — Progress Notes (Signed)
Patient ID: Latoya [name], female    DOB: May 12, 1956  Age: 65 y.o. MRN: [id number]    Subjective:   Chief Complaint  Patient presents with   Annual Exam   Subjective  HPI Latoya [name] presents for office visit today for comprehensive physical exam today and follow up on management of chronic concerns. She is doing well and has no febrile illnesses or recent ER visits to report. She has had cataracts surgeries in both eyes back in August. She notes improved vision and does not need glasses anymore, however she states that her night vision is worse. Denies CP/palp/SOB/HA/congestion/fevers/GI or GU c/o. Taking meds as prescribed.   She is currently on bioidentical hrt and had received a B12 shot from the same place. She has had a dexa scan and a mammo at the [facility]: [address], [place], [place] [address]. She reports that results from dexa and mammo were normal. She endorses taking probiotics.   Review of Systems  Constitutional:  Negative for chills, fatigue and fever.  HENT:  Negative for congestion, rhinorrhea, sinus pressure, sinus pain and sore throat.   Eyes:  Negative for pain.  Respiratory:  Negative for cough and shortness of breath.   Cardiovascular:  Negative for chest pain, palpitations and leg swelling.  Gastrointestinal:  Negative for abdominal pain, blood in stool, diarrhea, nausea and vomiting.  Genitourinary:  Negative for decreased urine volume, flank pain, frequency, vaginal bleeding and vaginal discharge.  Musculoskeletal:  Negative for back pain.  Neurological:  Negative for headaches.   History Past Medical History:  Diagnosis Date   Anxiety    Back pain    Cancer ([place]) 12/2009   BCC, SCC   Cervical cancer screening 12/30/2012   Dermatitis 12/03/2007   Qualifier: Diagnosis of  By: Jerold Coombe     Eczema 01/04/2013   Environmental allergies    Fatty liver    Hyperlipidemia    Hypertension    Morbid obesity ([place]) 01/13/2012   Multiple food  allergies    cashew chicken, chinese, msg?   Obesity 01/13/2012   Overweight 01/13/2012   Pain of toe of left foot 08/23/2014   Preventative health care 01/04/2013   Vitamin D deficiency     She has a past surgical history that includes dilation and currettage; Mohs surgery (12/2009); and Breast biopsy (Right).   Her family history includes Cancer in her father and maternal grandmother; Cancer (age of onset: 33) in her paternal grandmother; Diabetes in her brother, father, and mother; Heart Problems in her father and mother; Heart disease in her father and mother; Hyperlipidemia in her father and mother; Hypertension in her father and mother; Melanoma in an other family member; Obesity in her brother, brother, father, and sister; Psoriasis in her brother.She reports that she has quit smoking. She has never used smokeless tobacco. She reports current alcohol use. She reports that she does not use drugs.  Current Outpatient Medications on File Prior to Visit  Medication Sig Dispense Refill   atorvastatin (LIPITOR) 40 MG tablet Take 1 tablet (40 mg total) by mouth daily. 90 tablet 3   citalopram (CELEXA) 20 MG tablet Take 1 tablet (20 mg total) by mouth daily. 90 tablet 3   montelukast (SINGULAIR) 10 MG tablet Take 1 tablet (10 mg total) by mouth at bedtime. 90 tablet 3   No current facility-administered medications on file prior to visit.     Objective:  Objective  Physical Exam Constitutional:  General: She is not in acute distress.    Appearance: Normal appearance. She is not ill-appearing or toxic-appearing.  HENT:     Head: Normocephalic and atraumatic.     Right Ear: Tympanic membrane, ear canal and external ear normal.     Left Ear: Tympanic membrane, ear canal and external ear normal.     Nose: No congestion or rhinorrhea.  Eyes:     Extraocular Movements: Extraocular movements intact.     Right eye: No nystagmus.     Left eye: No nystagmus.     Pupils: Pupils are equal,  round, and reactive to light.  Cardiovascular:     Rate and Rhythm: Normal rate and regular rhythm.     Pulses: Normal pulses.     Heart sounds: Normal heart sounds. No murmur heard. Pulmonary:     Effort: Pulmonary effort is normal. No respiratory distress.     Breath sounds: Normal breath sounds. No wheezing, rhonchi or rales.  Abdominal:     General: Bowel sounds are normal.     Palpations: Abdomen is soft. There is no mass.     Tenderness: There is no abdominal tenderness. There is no guarding.     Hernia: No hernia is present.  Genitourinary:    General: Normal vulva.     Vagina: No vaginal discharge.     Cervix: No lesion.     Rectum: Guaiac result negative.  Musculoskeletal:        General: Normal range of motion.     Cervical back: Normal range of motion and neck supple.  Skin:    General: Skin is warm and dry.  Neurological:     Mental Status: She is alert and oriented to person, place, and time.     Motor: Motor function is intact. No weakness.     Deep Tendon Reflexes:     Reflex Scores:      Patellar reflexes are 2+ on the right side and 2+ on the left side. Psychiatric:        Behavior: Behavior normal.   BP 122/70   Pulse (!) 58   Temp 98.8 F (37.1 C)   Resp 16   Ht 5' 7.5" (1.715 m)   Wt 183 lb (83 kg)   LMP  (LMP Unknown) Comment: 2012 ?  BMI 28.24 kg/m  Wt Readings from Last 3 Encounters:  03/30/21 183 lb (83 kg)  08/18/20 184 lb 3.2 oz (83.6 kg)  05/15/18 200 lb (90.7 kg)     Lab Results  Component Value Date   WBC 6.9 03/30/2021   HGB 13.7 03/30/2021   HCT 41.2 03/30/2021   PLT 212.0 03/30/2021   GLUCOSE 84 03/30/2021   CHOL 180 03/30/2021   TRIG 72.0 03/30/2021   HDL 81.10 03/30/2021   LDLDIRECT 144.9 12/23/2008   LDLCALC 84 03/30/2021   ALT 36 (H) 03/30/2021   AST 38 (H) 03/30/2021   NA 136 03/30/2021   K 4.5 03/30/2021   CL 99 03/30/2021   CREATININE 1.11 03/30/2021   BUN 15 03/30/2021   CO2 27 03/30/2021   TSH 1.17  03/30/2021   HGBA1C 5.8 08/18/2020    US Abdomen Complete  Result Date: 02/13/2019 CLINICAL DATA:  Elevated LFTs EXAM: ABDOMEN ULTRASOUND COMPLETE COMPARISON:  None. FINDINGS: Gallbladder: No gallstones or wall thickening visualized. No sonographic Murphy sign noted by sonographer. Common bile duct: Diameter: 5 mm Liver: Hyperechoic hepatic parenchyma, suggesting hepatic steatosis. No focal hepatic lesion is seen. Portal vein is  patent on color Doppler imaging with normal direction of blood flow towards the liver. IVC: No abnormality visualized. Pancreas: Visualized portion unremarkable. Spleen: Within normal limits for size.  10 mm subcapsular cyst. Right Kidney: Length: 11.6 cm. Echogenicity within normal limits. No mass or hydronephrosis visualized. Left Kidney: Length: 10.9 cm. Echogenicity within normal limits. No mass or hydronephrosis visualized. Abdominal aorta: No aneurysm visualized. Other findings: None. IMPRESSION: Hyperechoic hepatic parenchyma, suggesting hepatic steatosis. Otherwise negative abdominal ultrasound. Electronically Signed   By: Julian Hy M.D.   On: 02/13/2019 20:49     Assessment & Plan:  Plan    Meds ordered this encounter  Medications   cetirizine (ZYRTEC) 10 MG tablet    Sig: Take 1 tablet (10 mg total) by mouth 2 (two) times daily.    Dispense:  180 tablet    Refill:  1   fluticasone-salmeterol (ADVAIR) 250-50 MCG/ACT AEPB    Sig: Inhale 1 puff into the lungs in the morning and at bedtime.    Dispense:  1 each    Refill:  5   losartan (COZAAR) 100 MG tablet    Sig: Take 1 tablet (100 mg total) by mouth daily.    Dispense:  90 tablet    Refill:  1   fluticasone (FLONASE) 50 MCG/ACT nasal spray    Sig: Place 2 sprays into both nostrils daily.    Dispense:  16 mL    Refill:  5   ALPRAZolam (XANAX) 0.25 MG tablet    Sig: Take 1-2 tablets (0.25-0.5 mg total) by mouth at bedtime as needed for sleep or anxiety.    Dispense:  20 tablet    Refill:  2      Problem List Items Addressed This Visit     Hyperlipidemia, mixed    Encourage heart healthy diet such as MIND or DASH diet, increase exercise, avoid trans fats, simple carbohydrates and processed foods, consider a krill or fish or flaxseed oil cap daily. Tolerating Atorvastatin      Relevant Medications   losartan (COZAAR) 100 MG tablet   Other Relevant Orders   CBC with Differential/Platelet (Completed)   Comprehensive metabolic panel (Completed)   Lipid panel (Completed)   TSH (Completed)   Essential hypertension    Well controlled, no changes to meds. Encouraged heart healthy diet such as the DASH diet and exercise as tolerated.       Relevant Medications   losartan (COZAAR) 100 MG tablet   Other Relevant Orders   CBC with Differential/Platelet (Completed)   Comprehensive metabolic panel (Completed)   Lipid panel (Completed)   TSH (Completed)   Cervical cancer screening   Relevant Orders   Cytology - PAP( [place])   Preventative health care - Primary    Patient encouraged to maintain heart healthy diet, regular exercise, adequate sleep. Consider daily probiotics. Take medications as prescribed. Labs ordered and reviewed. Given a flu shot and Prevnar 20. Pap done today.      Anxiety    Continue Citalopram daily and refill given on Alprazolam to use sparingly      Relevant Medications   ALPRAZolam (XANAX) 0.25 MG tablet   Vitamin B12 deficiency    Supplement and monitor      Relevant Orders   Vitamin B12 (Completed)   Vitamin D deficiency    Supplement and monitor      Relevant Orders   VITAMIN D 25 Hydroxy (Vit-D Deficiency, Fractures) (Completed)   Insulin resistance    hgba1c acceptable,  minimize simple carbs. Increase exercise as tolerated.       Other Visit Diagnoses     Need for influenza vaccination       Relevant Orders   Flu Vaccine QUAD High Dose(Fluad) (Completed)   Need for pneumococcal vaccination       Relevant Orders    Pneumococcal conjugate vaccine 20-valent ([id number]) (Completed)       Follow-up: Return in about 6 months (around 09/27/2021) for f/u visit as VV and CPE in 1 year.  I, Suezanne Jacquet, acting as a scribe for Penni Homans, MD, have documented all relevent documentation on behalf of Penni Homans, MD, as directed by Penni Homans, MD while in the presence of Penni Homans, MD. DO:03/31/21.  I, Mosie Lukes, MD personally performed the services described in this documentation. All medical record entries made by the scribe were at my direction and in my presence. I have reviewed the chart and agree that the record reflects my personal performance and is accurate and complete

## 2021-03-31 NOTE — Assessment & Plan Note (Signed)
Continue Citalopram daily and refill given on Alprazolam to use sparingly

## 2021-03-31 NOTE — Assessment & Plan Note (Addendum)
Encourage heart healthy diet such as MIND or DASH diet, increase exercise, avoid trans fats, simple carbohydrates and processed foods, consider a krill or fish or flaxseed oil cap daily. Tolerating Atorvastatin 

## 2021-03-31 NOTE — Assessment & Plan Note (Signed)
Well controlled, no changes to meds. Encouraged heart healthy diet such as the DASH diet and exercise as tolerated.  °

## 2021-03-31 NOTE — Assessment & Plan Note (Signed)
hgba1c acceptable, minimize simple carbs. Increase exercise as tolerated.  

## 2021-03-31 NOTE — Assessment & Plan Note (Signed)
Patient encouraged to maintain heart healthy diet, regular exercise, adequate sleep. Consider daily probiotics. Take medications as prescribed. Labs ordered and reviewed. Given a flu shot and Prevnar 20. Pap done today.

## 2021-04-04 LAB — CYTOLOGY - PAP
Adequacy: ABSENT
Diagnosis: NEGATIVE

## 2021-05-03 DIAGNOSIS — N951 Menopausal and female climacteric states: Secondary | ICD-10-CM | POA: Diagnosis not present

## 2021-05-03 DIAGNOSIS — E349 Endocrine disorder, unspecified: Secondary | ICD-10-CM | POA: Diagnosis not present

## 2021-05-03 DIAGNOSIS — R5381 Other malaise: Secondary | ICD-10-CM | POA: Diagnosis not present

## 2021-05-03 DIAGNOSIS — E039 Hypothyroidism, unspecified: Secondary | ICD-10-CM | POA: Diagnosis not present

## 2021-05-18 DIAGNOSIS — E785 Hyperlipidemia, unspecified: Secondary | ICD-10-CM | POA: Diagnosis not present

## 2021-05-18 DIAGNOSIS — Z7722 Contact with and (suspected) exposure to environmental tobacco smoke (acute) (chronic): Secondary | ICD-10-CM | POA: Diagnosis not present

## 2021-05-18 DIAGNOSIS — R69 Illness, unspecified: Secondary | ICD-10-CM | POA: Diagnosis not present

## 2021-05-18 DIAGNOSIS — Z008 Encounter for other general examination: Secondary | ICD-10-CM | POA: Diagnosis not present

## 2021-05-18 DIAGNOSIS — I10 Essential (primary) hypertension: Secondary | ICD-10-CM | POA: Diagnosis not present

## 2021-05-18 DIAGNOSIS — D84821 Immunodeficiency due to drugs: Secondary | ICD-10-CM | POA: Diagnosis not present

## 2021-05-18 DIAGNOSIS — M8580 Other specified disorders of bone density and structure, unspecified site: Secondary | ICD-10-CM | POA: Diagnosis not present

## 2021-05-18 DIAGNOSIS — L400 Psoriasis vulgaris: Secondary | ICD-10-CM | POA: Diagnosis not present

## 2021-05-18 DIAGNOSIS — Z7951 Long term (current) use of inhaled steroids: Secondary | ICD-10-CM | POA: Diagnosis not present

## 2021-05-18 DIAGNOSIS — J45909 Unspecified asthma, uncomplicated: Secondary | ICD-10-CM | POA: Diagnosis not present

## 2021-06-17 DIAGNOSIS — S0101XA Laceration without foreign body of scalp, initial encounter: Secondary | ICD-10-CM | POA: Diagnosis not present

## 2021-06-17 DIAGNOSIS — S93601A Unspecified sprain of right foot, initial encounter: Secondary | ICD-10-CM | POA: Diagnosis not present

## 2021-06-17 DIAGNOSIS — R55 Syncope and collapse: Secondary | ICD-10-CM | POA: Diagnosis not present

## 2021-06-17 DIAGNOSIS — R911 Solitary pulmonary nodule: Secondary | ICD-10-CM | POA: Diagnosis not present

## 2021-06-17 DIAGNOSIS — S99921A Unspecified injury of right foot, initial encounter: Secondary | ICD-10-CM | POA: Diagnosis not present

## 2021-06-17 DIAGNOSIS — S0191XA Laceration without foreign body of unspecified part of head, initial encounter: Secondary | ICD-10-CM | POA: Diagnosis not present

## 2021-06-17 DIAGNOSIS — Z20822 Contact with and (suspected) exposure to covid-19: Secondary | ICD-10-CM | POA: Diagnosis not present

## 2021-06-17 DIAGNOSIS — R918 Other nonspecific abnormal finding of lung field: Secondary | ICD-10-CM | POA: Diagnosis not present

## 2021-06-17 DIAGNOSIS — J168 Pneumonia due to other specified infectious organisms: Secondary | ICD-10-CM | POA: Diagnosis not present

## 2021-06-17 DIAGNOSIS — S199XXA Unspecified injury of neck, initial encounter: Secondary | ICD-10-CM | POA: Diagnosis not present

## 2021-06-17 DIAGNOSIS — R9431 Abnormal electrocardiogram [ECG] [EKG]: Secondary | ICD-10-CM | POA: Diagnosis not present

## 2021-06-17 DIAGNOSIS — R059 Cough, unspecified: Secondary | ICD-10-CM | POA: Diagnosis not present

## 2021-06-17 DIAGNOSIS — I10 Essential (primary) hypertension: Secondary | ICD-10-CM | POA: Diagnosis not present

## 2021-06-17 DIAGNOSIS — J189 Pneumonia, unspecified organism: Secondary | ICD-10-CM | POA: Diagnosis not present

## 2021-06-17 DIAGNOSIS — S0990XA Unspecified injury of head, initial encounter: Secondary | ICD-10-CM | POA: Diagnosis not present

## 2021-06-17 DIAGNOSIS — W19XXXA Unspecified fall, initial encounter: Secondary | ICD-10-CM | POA: Diagnosis not present

## 2021-06-27 DIAGNOSIS — Z4802 Encounter for removal of sutures: Secondary | ICD-10-CM | POA: Diagnosis not present

## 2021-06-29 ENCOUNTER — Telehealth: Payer: Self-pay | Admitting: Family Medicine

## 2021-06-29 NOTE — Telephone Encounter (Signed)
I called to schedule patient's AWV.  Patient said she has moved to [place], [place] and she has an upcoming appointment with a doctor there.

## 2021-07-17 DIAGNOSIS — F419 Anxiety disorder, unspecified: Secondary | ICD-10-CM | POA: Diagnosis not present

## 2021-07-17 DIAGNOSIS — E785 Hyperlipidemia, unspecified: Secondary | ICD-10-CM | POA: Diagnosis not present

## 2021-07-17 DIAGNOSIS — R69 Illness, unspecified: Secondary | ICD-10-CM | POA: Diagnosis not present

## 2021-07-17 DIAGNOSIS — F32A Depression, unspecified: Secondary | ICD-10-CM | POA: Diagnosis not present

## 2021-07-17 DIAGNOSIS — R55 Syncope and collapse: Secondary | ICD-10-CM | POA: Diagnosis not present

## 2021-07-17 DIAGNOSIS — R9389 Abnormal findings on diagnostic imaging of other specified body structures: Secondary | ICD-10-CM | POA: Diagnosis not present

## 2021-07-26 DIAGNOSIS — R5383 Other fatigue: Secondary | ICD-10-CM | POA: Diagnosis not present

## 2021-08-01 DIAGNOSIS — R9389 Abnormal findings on diagnostic imaging of other specified body structures: Secondary | ICD-10-CM | POA: Diagnosis not present

## 2021-08-01 DIAGNOSIS — J9811 Atelectasis: Secondary | ICD-10-CM | POA: Diagnosis not present

## 2021-08-01 DIAGNOSIS — J984 Other disorders of lung: Secondary | ICD-10-CM | POA: Diagnosis not present

## 2021-08-01 DIAGNOSIS — R55 Syncope and collapse: Secondary | ICD-10-CM | POA: Diagnosis not present

## 2021-08-09 DIAGNOSIS — R0781 Pleurodynia: Secondary | ICD-10-CM | POA: Diagnosis not present

## 2021-08-16 ENCOUNTER — Encounter: Payer: Self-pay | Admitting: Internal Medicine

## 2021-08-28 DIAGNOSIS — H5213 Myopia, bilateral: Secondary | ICD-10-CM | POA: Diagnosis not present

## 2021-08-28 DIAGNOSIS — H43393 Other vitreous opacities, bilateral: Secondary | ICD-10-CM | POA: Diagnosis not present

## 2021-08-28 DIAGNOSIS — H1132 Conjunctival hemorrhage, left eye: Secondary | ICD-10-CM | POA: Diagnosis not present

## 2021-08-31 ENCOUNTER — Other Ambulatory Visit: Payer: Self-pay | Admitting: Family Medicine

## 2021-08-31 DIAGNOSIS — E785 Hyperlipidemia, unspecified: Secondary | ICD-10-CM

## 2021-08-31 DIAGNOSIS — F32A Depression, unspecified: Secondary | ICD-10-CM

## 2021-09-06 ENCOUNTER — Telehealth: Payer: Self-pay | Admitting: Family Medicine

## 2021-09-06 NOTE — Telephone Encounter (Signed)
Left message for patient to call back and schedule Medicare Annual Wellness Visit (AWV) either virtually or phone ? ?awvi 03/28/21 per [place]  ?please schedule at anytime with health coach ? ?This should be a 45 minute visit.  ?

## 2021-09-28 ENCOUNTER — Telehealth: Payer: Medicare HMO | Admitting: Family Medicine

## 2021-10-18 ENCOUNTER — Telehealth: Payer: Self-pay | Admitting: Family Medicine

## 2021-10-18 DIAGNOSIS — I10 Essential (primary) hypertension: Secondary | ICD-10-CM

## 2021-10-18 MED ORDER — LOSARTAN POTASSIUM 100 MG PO TABS: 100.0000 mg | ORAL_TABLET | Freq: Every day | ORAL | 1 refills | Status: AC

## 2021-10-18 NOTE — Telephone Encounter (Signed)
Refill sent.

## 2021-10-18 NOTE — Telephone Encounter (Signed)
Pt is out of town and needing a refill, pt aware that rx is expired.   Medication: losartan (COZAAR) 100 MG tablet   Has the patient contacted their pharmacy? No.   Preferred Pharmacy:  [facility] [address] [place], [place] [address]

## 2021-10-24 DIAGNOSIS — M2550 Pain in unspecified joint: Secondary | ICD-10-CM | POA: Diagnosis not present

## 2021-10-24 DIAGNOSIS — E039 Hypothyroidism, unspecified: Secondary | ICD-10-CM | POA: Diagnosis not present

## 2021-10-24 DIAGNOSIS — R5383 Other fatigue: Secondary | ICD-10-CM | POA: Diagnosis not present

## 2021-10-24 DIAGNOSIS — N951 Menopausal and female climacteric states: Secondary | ICD-10-CM | POA: Diagnosis not present

## 2021-11-07 DIAGNOSIS — B351 Tinea unguium: Secondary | ICD-10-CM | POA: Diagnosis not present

## 2021-12-19 ENCOUNTER — Telehealth: Payer: Self-pay | Admitting: Family Medicine

## 2021-12-19 NOTE — Telephone Encounter (Signed)
Left message for patient to call back and schedule Medicare Annual Wellness Visit (AWV).   Please offer to do virtually or by telephone.  Left office number and my jabber [phone].  AWVI eligible as of 03/28/2021   Please schedule at anytime with Nurse Health Advisor.

## 2022-01-08 DIAGNOSIS — E785 Hyperlipidemia, unspecified: Secondary | ICD-10-CM | POA: Diagnosis not present

## 2022-01-08 DIAGNOSIS — R69 Illness, unspecified: Secondary | ICD-10-CM | POA: Diagnosis not present

## 2022-01-08 DIAGNOSIS — Z1329 Encounter for screening for other suspected endocrine disorder: Secondary | ICD-10-CM | POA: Diagnosis not present

## 2022-01-08 DIAGNOSIS — Z79899 Other long term (current) drug therapy: Secondary | ICD-10-CM | POA: Diagnosis not present

## 2022-01-08 DIAGNOSIS — F32A Depression, unspecified: Secondary | ICD-10-CM | POA: Diagnosis not present

## 2022-01-18 DIAGNOSIS — C44219 Basal cell carcinoma of skin of left ear and external auricular canal: Secondary | ICD-10-CM | POA: Diagnosis not present

## 2022-01-18 DIAGNOSIS — D485 Neoplasm of uncertain behavior of skin: Secondary | ICD-10-CM | POA: Diagnosis not present

## 2022-01-23 DIAGNOSIS — I10 Essential (primary) hypertension: Secondary | ICD-10-CM | POA: Diagnosis not present

## 2022-01-23 DIAGNOSIS — L409 Psoriasis, unspecified: Secondary | ICD-10-CM | POA: Diagnosis not present

## 2022-01-23 DIAGNOSIS — R69 Illness, unspecified: Secondary | ICD-10-CM | POA: Diagnosis not present

## 2022-01-23 DIAGNOSIS — F32A Depression, unspecified: Secondary | ICD-10-CM | POA: Diagnosis not present

## 2022-01-23 DIAGNOSIS — Z79899 Other long term (current) drug therapy: Secondary | ICD-10-CM | POA: Diagnosis not present

## 2022-01-23 DIAGNOSIS — Z1329 Encounter for screening for other suspected endocrine disorder: Secondary | ICD-10-CM | POA: Diagnosis not present

## 2022-01-23 DIAGNOSIS — E785 Hyperlipidemia, unspecified: Secondary | ICD-10-CM | POA: Diagnosis not present

## 2022-01-23 DIAGNOSIS — Z9109 Other allergy status, other than to drugs and biological substances: Secondary | ICD-10-CM | POA: Diagnosis not present

## 2022-01-26 DIAGNOSIS — N951 Menopausal and female climacteric states: Secondary | ICD-10-CM | POA: Diagnosis not present

## 2022-01-26 DIAGNOSIS — R5383 Other fatigue: Secondary | ICD-10-CM | POA: Diagnosis not present

## 2022-01-26 DIAGNOSIS — M2550 Pain in unspecified joint: Secondary | ICD-10-CM | POA: Diagnosis not present

## 2022-01-26 DIAGNOSIS — E039 Hypothyroidism, unspecified: Secondary | ICD-10-CM | POA: Diagnosis not present

## 2022-03-21 DIAGNOSIS — Z85828 Personal history of other malignant neoplasm of skin: Secondary | ICD-10-CM | POA: Diagnosis not present

## 2022-03-21 DIAGNOSIS — Z79899 Other long term (current) drug therapy: Secondary | ICD-10-CM | POA: Diagnosis not present

## 2022-03-21 DIAGNOSIS — L820 Inflamed seborrheic keratosis: Secondary | ICD-10-CM | POA: Diagnosis not present

## 2022-04-02 ENCOUNTER — Encounter: Payer: Medicare HMO | Admitting: Family Medicine

## 2022-04-02 DIAGNOSIS — D170 Benign lipomatous neoplasm of skin and subcutaneous tissue of head, face and neck: Secondary | ICD-10-CM | POA: Diagnosis not present

## 2022-04-02 DIAGNOSIS — C44219 Basal cell carcinoma of skin of left ear and external auricular canal: Secondary | ICD-10-CM | POA: Diagnosis not present

## 2022-07-12 DIAGNOSIS — J208 Acute bronchitis due to other specified organisms: Secondary | ICD-10-CM | POA: Diagnosis not present

## 2022-07-12 DIAGNOSIS — R0989 Other specified symptoms and signs involving the circulatory and respiratory systems: Secondary | ICD-10-CM | POA: Diagnosis not present

## 2022-07-12 DIAGNOSIS — B9689 Other specified bacterial agents as the cause of diseases classified elsewhere: Secondary | ICD-10-CM | POA: Diagnosis not present

## 2022-07-12 DIAGNOSIS — J4541 Moderate persistent asthma with (acute) exacerbation: Secondary | ICD-10-CM | POA: Diagnosis not present

## 2022-07-12 DIAGNOSIS — Z9109 Other allergy status, other than to drugs and biological substances: Secondary | ICD-10-CM | POA: Diagnosis not present

## 2022-07-17 DIAGNOSIS — R0602 Shortness of breath: Secondary | ICD-10-CM | POA: Diagnosis not present

## 2022-07-31 DIAGNOSIS — R5383 Other fatigue: Secondary | ICD-10-CM | POA: Diagnosis not present

## 2022-07-31 DIAGNOSIS — R0683 Snoring: Secondary | ICD-10-CM | POA: Diagnosis not present

## 2022-07-31 DIAGNOSIS — N951 Menopausal and female climacteric states: Secondary | ICD-10-CM | POA: Diagnosis not present

## 2022-07-31 DIAGNOSIS — E039 Hypothyroidism, unspecified: Secondary | ICD-10-CM | POA: Diagnosis not present

## 2022-07-31 DIAGNOSIS — M2550 Pain in unspecified joint: Secondary | ICD-10-CM | POA: Diagnosis not present

## 2022-07-31 DIAGNOSIS — J4520 Mild intermittent asthma, uncomplicated: Secondary | ICD-10-CM | POA: Diagnosis not present

## 2022-08-18 DIAGNOSIS — R69 Illness, unspecified: Secondary | ICD-10-CM | POA: Diagnosis not present

## 2022-09-01 DIAGNOSIS — R0683 Snoring: Secondary | ICD-10-CM | POA: Diagnosis not present

## 2022-09-11 DIAGNOSIS — L400 Psoriasis vulgaris: Secondary | ICD-10-CM | POA: Diagnosis not present

## 2022-09-11 DIAGNOSIS — L814 Other melanin hyperpigmentation: Secondary | ICD-10-CM | POA: Diagnosis not present

## 2022-09-11 DIAGNOSIS — L579 Skin changes due to chronic exposure to nonionizing radiation, unspecified: Secondary | ICD-10-CM | POA: Diagnosis not present

## 2022-09-11 DIAGNOSIS — Z85828 Personal history of other malignant neoplasm of skin: Secondary | ICD-10-CM | POA: Diagnosis not present

## 2022-09-11 DIAGNOSIS — L570 Actinic keratosis: Secondary | ICD-10-CM | POA: Diagnosis not present

## 2022-09-11 DIAGNOSIS — D2261 Melanocytic nevi of right upper limb, including shoulder: Secondary | ICD-10-CM | POA: Diagnosis not present

## 2022-09-11 DIAGNOSIS — L821 Other seborrheic keratosis: Secondary | ICD-10-CM | POA: Diagnosis not present

## 2022-09-12 ENCOUNTER — Telehealth: Payer: Self-pay | Admitting: Family Medicine

## 2022-09-12 NOTE — Telephone Encounter (Signed)
Contacted Latoya [name] to schedule their annual wellness visit. Patient declined to schedule AWV at this time.Stated she move [place]  [employer]; Care Guide Ambulatory Clinical Support [place] [facility] Direct Dial: [phone]

## 2022-09-18 DIAGNOSIS — R69 Illness, unspecified: Secondary | ICD-10-CM | POA: Diagnosis not present

## 2022-09-24 DIAGNOSIS — H5213 Myopia, bilateral: Secondary | ICD-10-CM | POA: Diagnosis not present

## 2022-09-24 DIAGNOSIS — H1132 Conjunctival hemorrhage, left eye: Secondary | ICD-10-CM | POA: Diagnosis not present

## 2022-09-24 DIAGNOSIS — H43393 Other vitreous opacities, bilateral: Secondary | ICD-10-CM | POA: Diagnosis not present

## 2022-10-18 DIAGNOSIS — R69 Illness, unspecified: Secondary | ICD-10-CM | POA: Diagnosis not present

## 2022-11-01 DIAGNOSIS — G4733 Obstructive sleep apnea (adult) (pediatric): Secondary | ICD-10-CM | POA: Diagnosis not present

## 2022-11-01 DIAGNOSIS — J45909 Unspecified asthma, uncomplicated: Secondary | ICD-10-CM | POA: Diagnosis not present

## 2022-11-18 DIAGNOSIS — R69 Illness, unspecified: Secondary | ICD-10-CM | POA: Diagnosis not present

## 2022-12-04 DIAGNOSIS — Z79899 Other long term (current) drug therapy: Secondary | ICD-10-CM | POA: Diagnosis not present

## 2022-12-04 DIAGNOSIS — L400 Psoriasis vulgaris: Secondary | ICD-10-CM | POA: Diagnosis not present

## 2022-12-18 DIAGNOSIS — Z01419 Encounter for gynecological examination (general) (routine) without abnormal findings: Secondary | ICD-10-CM | POA: Diagnosis not present

## 2022-12-18 DIAGNOSIS — N959 Unspecified menopausal and perimenopausal disorder: Secondary | ICD-10-CM | POA: Diagnosis not present

## 2022-12-18 DIAGNOSIS — Z124 Encounter for screening for malignant neoplasm of cervix: Secondary | ICD-10-CM | POA: Diagnosis not present

## 2022-12-18 DIAGNOSIS — E663 Overweight: Secondary | ICD-10-CM | POA: Diagnosis not present

## 2022-12-18 DIAGNOSIS — E669 Obesity, unspecified: Secondary | ICD-10-CM | POA: Diagnosis not present

## 2022-12-18 DIAGNOSIS — M2550 Pain in unspecified joint: Secondary | ICD-10-CM | POA: Diagnosis not present

## 2022-12-18 DIAGNOSIS — E039 Hypothyroidism, unspecified: Secondary | ICD-10-CM | POA: Diagnosis not present

## 2022-12-18 DIAGNOSIS — Z1239 Encounter for other screening for malignant neoplasm of breast: Secondary | ICD-10-CM | POA: Diagnosis not present

## 2022-12-18 DIAGNOSIS — R5383 Other fatigue: Secondary | ICD-10-CM | POA: Diagnosis not present

## 2022-12-18 DIAGNOSIS — N951 Menopausal and female climacteric states: Secondary | ICD-10-CM | POA: Diagnosis not present

## 2023-01-18 DIAGNOSIS — R69 Illness, unspecified: Secondary | ICD-10-CM | POA: Diagnosis not present

## 2023-01-22 DIAGNOSIS — Z1231 Encounter for screening mammogram for malignant neoplasm of breast: Secondary | ICD-10-CM | POA: Diagnosis not present

## 2023-01-22 DIAGNOSIS — F411 Generalized anxiety disorder: Secondary | ICD-10-CM | POA: Diagnosis not present

## 2023-01-23 DIAGNOSIS — G4733 Obstructive sleep apnea (adult) (pediatric): Secondary | ICD-10-CM | POA: Diagnosis not present

## 2023-01-23 DIAGNOSIS — Z008 Encounter for other general examination: Secondary | ICD-10-CM | POA: Diagnosis not present

## 2023-01-23 DIAGNOSIS — E039 Hypothyroidism, unspecified: Secondary | ICD-10-CM | POA: Diagnosis not present

## 2023-01-23 DIAGNOSIS — K219 Gastro-esophageal reflux disease without esophagitis: Secondary | ICD-10-CM | POA: Diagnosis not present

## 2023-01-23 DIAGNOSIS — J45909 Unspecified asthma, uncomplicated: Secondary | ICD-10-CM | POA: Diagnosis not present

## 2023-01-23 DIAGNOSIS — L400 Psoriasis vulgaris: Secondary | ICD-10-CM | POA: Diagnosis not present

## 2023-01-23 DIAGNOSIS — E785 Hyperlipidemia, unspecified: Secondary | ICD-10-CM | POA: Diagnosis not present

## 2023-01-23 DIAGNOSIS — F325 Major depressive disorder, single episode, in full remission: Secondary | ICD-10-CM | POA: Diagnosis not present

## 2023-01-23 DIAGNOSIS — N182 Chronic kidney disease, stage 2 (mild): Secondary | ICD-10-CM | POA: Diagnosis not present

## 2023-01-23 DIAGNOSIS — M8580 Other specified disorders of bone density and structure, unspecified site: Secondary | ICD-10-CM | POA: Diagnosis not present

## 2023-01-23 DIAGNOSIS — F419 Anxiety disorder, unspecified: Secondary | ICD-10-CM | POA: Diagnosis not present

## 2023-01-23 DIAGNOSIS — I129 Hypertensive chronic kidney disease with stage 1 through stage 4 chronic kidney disease, or unspecified chronic kidney disease: Secondary | ICD-10-CM | POA: Diagnosis not present

## 2023-01-23 DIAGNOSIS — D84821 Immunodeficiency due to drugs: Secondary | ICD-10-CM | POA: Diagnosis not present

## 2023-01-25 DIAGNOSIS — D122 Benign neoplasm of ascending colon: Secondary | ICD-10-CM | POA: Diagnosis not present

## 2023-01-25 DIAGNOSIS — Z1211 Encounter for screening for malignant neoplasm of colon: Secondary | ICD-10-CM | POA: Diagnosis not present

## 2023-01-25 DIAGNOSIS — K635 Polyp of colon: Secondary | ICD-10-CM | POA: Diagnosis not present

## 2023-01-25 DIAGNOSIS — D120 Benign neoplasm of cecum: Secondary | ICD-10-CM | POA: Diagnosis not present

## 2023-01-25 DIAGNOSIS — K5730 Diverticulosis of large intestine without perforation or abscess without bleeding: Secondary | ICD-10-CM | POA: Diagnosis not present

## 2023-01-25 DIAGNOSIS — Z86010 Personal history of colonic polyps: Secondary | ICD-10-CM | POA: Diagnosis not present

## 2023-02-01 DIAGNOSIS — J4520 Mild intermittent asthma, uncomplicated: Secondary | ICD-10-CM | POA: Diagnosis not present

## 2023-02-01 DIAGNOSIS — G4733 Obstructive sleep apnea (adult) (pediatric): Secondary | ICD-10-CM | POA: Diagnosis not present

## 2023-02-04 DIAGNOSIS — L509 Urticaria, unspecified: Secondary | ICD-10-CM | POA: Diagnosis not present

## 2023-02-05 DIAGNOSIS — F411 Generalized anxiety disorder: Secondary | ICD-10-CM | POA: Diagnosis not present

## 2023-02-18 DIAGNOSIS — R69 Illness, unspecified: Secondary | ICD-10-CM | POA: Diagnosis not present

## 2023-02-26 DIAGNOSIS — F411 Generalized anxiety disorder: Secondary | ICD-10-CM | POA: Diagnosis not present

## 2023-03-05 DIAGNOSIS — Z79899 Other long term (current) drug therapy: Secondary | ICD-10-CM | POA: Diagnosis not present

## 2023-03-05 DIAGNOSIS — K130 Diseases of lips: Secondary | ICD-10-CM | POA: Diagnosis not present

## 2023-03-05 DIAGNOSIS — L400 Psoriasis vulgaris: Secondary | ICD-10-CM | POA: Diagnosis not present

## 2023-03-29 DIAGNOSIS — L400 Psoriasis vulgaris: Secondary | ICD-10-CM | POA: Diagnosis not present

## 2023-03-29 DIAGNOSIS — Z79899 Other long term (current) drug therapy: Secondary | ICD-10-CM | POA: Diagnosis not present

## 2023-04-05 DIAGNOSIS — E785 Hyperlipidemia, unspecified: Secondary | ICD-10-CM | POA: Diagnosis not present

## 2023-04-05 DIAGNOSIS — B9689 Other specified bacterial agents as the cause of diseases classified elsewhere: Secondary | ICD-10-CM | POA: Diagnosis not present

## 2023-04-05 DIAGNOSIS — F32A Depression, unspecified: Secondary | ICD-10-CM | POA: Diagnosis not present

## 2023-04-05 DIAGNOSIS — Z9109 Other allergy status, other than to drugs and biological substances: Secondary | ICD-10-CM | POA: Diagnosis not present

## 2023-04-05 DIAGNOSIS — J208 Acute bronchitis due to other specified organisms: Secondary | ICD-10-CM | POA: Diagnosis not present

## 2023-04-05 DIAGNOSIS — Z1329 Encounter for screening for other suspected endocrine disorder: Secondary | ICD-10-CM | POA: Diagnosis not present

## 2023-04-05 DIAGNOSIS — Z79899 Other long term (current) drug therapy: Secondary | ICD-10-CM | POA: Diagnosis not present

## 2023-04-05 DIAGNOSIS — I10 Essential (primary) hypertension: Secondary | ICD-10-CM | POA: Diagnosis not present
